# Patient Record
Sex: Male | Born: 1935 | Race: White | Hispanic: No | Marital: Married | Smoking: Never smoker
Health system: Southern US, Community
[De-identification: ages and names within clinical notes are randomized; demographics above are authoritative.]

## PROBLEM LIST (undated history)

## (undated) DIAGNOSIS — I10 Essential (primary) hypertension: Secondary | ICD-10-CM

## (undated) DIAGNOSIS — E119 Type 2 diabetes mellitus without complications: Secondary | ICD-10-CM

## (undated) DIAGNOSIS — C801 Malignant (primary) neoplasm, unspecified: Secondary | ICD-10-CM

## (undated) DIAGNOSIS — Z973 Presence of spectacles and contact lenses: Secondary | ICD-10-CM

## (undated) DIAGNOSIS — H269 Unspecified cataract: Secondary | ICD-10-CM

## (undated) DIAGNOSIS — M1990 Unspecified osteoarthritis, unspecified site: Secondary | ICD-10-CM

## (undated) DIAGNOSIS — N400 Enlarged prostate without lower urinary tract symptoms: Secondary | ICD-10-CM

## (undated) DIAGNOSIS — IMO0001 Reserved for inherently not codable concepts without codable children: Secondary | ICD-10-CM

## (undated) DIAGNOSIS — Z972 Presence of dental prosthetic device (complete) (partial): Secondary | ICD-10-CM

## (undated) DIAGNOSIS — R3915 Urgency of urination: Secondary | ICD-10-CM

## (undated) DIAGNOSIS — H9190 Unspecified hearing loss, unspecified ear: Secondary | ICD-10-CM

## (undated) HISTORY — PX: CATARACT EXTRACTION: SUR2

## (undated) HISTORY — PX: COLONOSCOPY: SHX174

---

## 1999-08-07 ENCOUNTER — Ambulatory Visit (HOSPITAL_COMMUNITY): Admission: RE | Admit: 1999-08-07 | Discharge: 1999-08-07 | Payer: Self-pay | Admitting: Gastroenterology

## 2004-08-07 ENCOUNTER — Ambulatory Visit (HOSPITAL_COMMUNITY): Admission: RE | Admit: 2004-08-07 | Discharge: 2004-08-07 | Payer: Self-pay | Admitting: Gastroenterology

## 2008-10-04 ENCOUNTER — Emergency Department: Payer: Self-pay | Admitting: Internal Medicine

## 2010-03-03 ENCOUNTER — Emergency Department: Payer: Self-pay | Admitting: Psychiatry

## 2013-06-12 ENCOUNTER — Other Ambulatory Visit: Payer: Self-pay | Admitting: Orthopedic Surgery

## 2013-06-18 ENCOUNTER — Encounter (HOSPITAL_BASED_OUTPATIENT_CLINIC_OR_DEPARTMENT_OTHER): Payer: Self-pay | Admitting: *Deleted

## 2013-06-18 NOTE — Progress Notes (Signed)
W[facility] to bring [facility] from [facility]-and all meds- To call back with med list

## 2013-06-22 NOTE — H&P (Signed)
  Caleb Dougherty is an 79 y.o. male.   Chief Complaint: c/o chronic and progressive numbness and tingling of the right hand HPI: Zarius Furr presented for evaluation of a history of marked swelling of his right hand and wrist and hand numbness, right worse then left. Mr. Reth is 79 years of age and right hand dominant. He was the owner of a Restaurant manager, fast food. In the first week of September 2014 he noted marked swelling of his right hand and wrist. He was seen at a walk in clinic in [place], [place]. He was thought to have cellulitis and given antibiotics. He followed up with his primary care physician and was referred to orthopaedics at the [facility]. The physicians at [facility] felt he probably had a resolving cellulitis or synovitis and recommended completion of the antibiotics. He now presents for evaluation of hand numbness and concern about his history of swelling. He does have a history of type II diabetes, hyperlipidemia, hypertension and osteoarthritis.    Past Medical History  Diagnosis Date  . Wears dentures   . Wears glasses     reading  . Hypertension   . Diabetes mellitus without complication   . Arthritis   . GERD (gastroesophageal reflux disease)   . BPH (benign prostatic hypertrophy)     Past Surgical History  Procedure Laterality Date  . Colonoscopy    . Cataract extraction      History reviewed. No pertinent family history. Social History:  reports that he has never smoked. He does not have any smokeless tobacco history on file. He reports that he does not drink alcohol or use illicit drugs.  Allergies: No Known Allergies  No prescriptions prior to admission    No results found for this or any previous visit (from the past 48 hour(s)).  No results found.   Pertinent items are noted in HPI.  Height 5\' 8"  (1.727 m), weight 78.019 kg (172 lb).  General appearance: alert Head: Normocephalic, without obvious abnormality Neck: supple, symmetrical,  trachea midline Resp: clear to auscultation bilaterally Cardio: regular rate and rhythm GI: normal findings: bowel sounds normal Extremities:  Inspection of his hands and wrists reveal no rubor or swelling at this time. He does have Heberden's and Bouchard's nodes and shouldering of his thumb CMC joints bilaterally. He has essentially full motion but notes diminished sensibility in the median distribution of the right hand. He has positive provocative signs of carpal tunnel syndrome on the left. His pulses and capillary refill are intact. X-rays of his right hand and wrist AP, lateral and oblique from an outside source demonstrate wide spread chondrocalcinosis and advanced osteoarthritis of his IP joints, thumb CMC joint and right thumb MP joint Dr. Johna Roles performed electrodiagnostic studies of the median and ulnar nerves. Mr. Duesing has significant right carpal tunnel syndrome and mild to moderate left carpal tunnel syndrome Pulses: 2+ and symmetric Skin: normal Neurologic: Grossly normal    Assessment/Plan Impression: Right CTS  Plan: TO the OR for right CTR.The procedure, risks,benefits and post-op course were discussed with the patient at length and they were in agreement with the plan.  DASNOIT,Jimma Ortman J 06/22/2013, 2:16 PM   H&P documentation: 06/23/2013  -History and Physical Reviewed  -Patient has been re-examined  -No change in the plan of care  Wyn Forster, MD

## 2013-06-23 ENCOUNTER — Encounter (HOSPITAL_BASED_OUTPATIENT_CLINIC_OR_DEPARTMENT_OTHER)
Admission: RE | Disposition: A | Discharge status: Home / Self Care | Payer: Self-pay | Source: Ambulatory Visit | Attending: Orthopedic Surgery

## 2013-06-23 ENCOUNTER — Encounter (HOSPITAL_BASED_OUTPATIENT_CLINIC_OR_DEPARTMENT_OTHER): Payer: Self-pay | Admitting: *Deleted

## 2013-06-23 ENCOUNTER — Encounter (HOSPITAL_BASED_OUTPATIENT_CLINIC_OR_DEPARTMENT_OTHER): Payer: Medicare Other | Admitting: Anesthesiology

## 2013-06-23 ENCOUNTER — Ambulatory Visit (HOSPITAL_BASED_OUTPATIENT_CLINIC_OR_DEPARTMENT_OTHER): Payer: Medicare Other | Admitting: Anesthesiology

## 2013-06-23 ENCOUNTER — Ambulatory Visit (HOSPITAL_BASED_OUTPATIENT_CLINIC_OR_DEPARTMENT_OTHER)
Admission: RE | Admit: 2013-06-23 | Discharge: 2013-06-23 | Disposition: A | Discharge status: Home / Self Care | Payer: Medicare Other | Source: Ambulatory Visit | Attending: Orthopedic Surgery | Admitting: Orthopedic Surgery

## 2013-06-23 DIAGNOSIS — K219 Gastro-esophageal reflux disease without esophagitis: Secondary | ICD-10-CM | POA: Insufficient documentation

## 2013-06-23 DIAGNOSIS — N400 Enlarged prostate without lower urinary tract symptoms: Secondary | ICD-10-CM | POA: Insufficient documentation

## 2013-06-23 DIAGNOSIS — I10 Essential (primary) hypertension: Secondary | ICD-10-CM | POA: Insufficient documentation

## 2013-06-23 DIAGNOSIS — M129 Arthropathy, unspecified: Secondary | ICD-10-CM | POA: Insufficient documentation

## 2013-06-23 DIAGNOSIS — G5600 Carpal tunnel syndrome, unspecified upper limb: Secondary | ICD-10-CM | POA: Insufficient documentation

## 2013-06-23 DIAGNOSIS — E119 Type 2 diabetes mellitus without complications: Secondary | ICD-10-CM | POA: Insufficient documentation

## 2013-06-23 DIAGNOSIS — E785 Hyperlipidemia, unspecified: Secondary | ICD-10-CM | POA: Insufficient documentation

## 2013-06-23 DIAGNOSIS — M11239 Other chondrocalcinosis, unspecified wrist: Secondary | ICD-10-CM | POA: Insufficient documentation

## 2013-06-23 HISTORY — DX: Type 2 diabetes mellitus without complications: E11.9

## 2013-06-23 HISTORY — DX: Presence of spectacles and contact lenses: Z97.3

## 2013-06-23 HISTORY — PX: CARPAL TUNNEL RELEASE: SHX101

## 2013-06-23 HISTORY — DX: Unspecified osteoarthritis, unspecified site: M19.90

## 2013-06-23 HISTORY — DX: Essential (primary) hypertension: I10

## 2013-06-23 HISTORY — DX: Benign prostatic hyperplasia without lower urinary tract symptoms: N40.0

## 2013-06-23 HISTORY — DX: Presence of dental prosthetic device (complete) (partial): Z97.2

## 2013-06-23 LAB — POCT I-STAT, CHEM 8
BUN: 31 mg/dL — ABNORMAL HIGH (ref 6–23)
Calcium, Ion: 1.28 mmol/L (ref 1.13–1.30)
Chloride: 104 mEq/L (ref 96–112)
Creatinine, Ser: 1.1 mg/dL (ref 0.50–1.35)
Glucose, Bld: 183 mg/dL — ABNORMAL HIGH (ref 70–99)
Sodium: 142 mEq/L (ref 135–145)

## 2013-06-23 LAB — GLUCOSE, CAPILLARY: Glucose-Capillary: 152 mg/dL — ABNORMAL HIGH (ref 70–99)

## 2013-06-23 SURGERY — CARPAL TUNNEL RELEASE
Anesthesia: General | Site: Hand | Laterality: Right | Wound class: Clean

## 2013-06-23 MED ORDER — FENTANYL CITRATE 0.05 MG/ML IJ SOLN
INTRAMUSCULAR | Status: AC
  Filled 2013-06-23: qty 2

## 2013-06-23 MED ORDER — CHLORHEXIDINE GLUCONATE 4 % EX LIQD: 60.0000 mL | Freq: Once | CUTANEOUS | Status: DC

## 2013-06-23 MED ORDER — ONDANSETRON HCL 4 MG/2ML IJ SOLN
INTRAMUSCULAR | Status: DC | PRN
  Administered 2013-06-23: 4 mg via INTRAVENOUS

## 2013-06-23 MED ORDER — FENTANYL CITRATE 0.05 MG/ML IJ SOLN
INTRAMUSCULAR | Status: DC | PRN
  Administered 2013-06-23: 50 ug via INTRAVENOUS

## 2013-06-23 MED ORDER — LIDOCAINE HCL 2 % IJ SOLN
INTRAMUSCULAR | Status: AC
  Filled 2013-06-23: qty 20

## 2013-06-23 MED ORDER — OXYCODONE HCL 5 MG PO TABS: 5.0000 mg | ORAL_TABLET | Freq: Once | ORAL | Status: DC | PRN

## 2013-06-23 MED ORDER — PROPOFOL 10 MG/ML IV EMUL
INTRAVENOUS | Status: AC
  Filled 2013-06-23: qty 50

## 2013-06-23 MED ORDER — PROPOFOL 10 MG/ML IV BOLUS
INTRAVENOUS | Status: DC | PRN
  Administered 2013-06-23: 20 mg via INTRAVENOUS

## 2013-06-23 MED ORDER — MIDAZOLAM HCL 2 MG/2ML IJ SOLN
INTRAMUSCULAR | Status: AC
  Filled 2013-06-23: qty 2

## 2013-06-23 MED ORDER — METOCLOPRAMIDE HCL 5 MG/ML IJ SOLN: 10.0000 mg | Freq: Once | INTRAMUSCULAR | Status: DC | PRN

## 2013-06-23 MED ORDER — PROPOFOL INFUSION 10 MG/ML OPTIME
INTRAVENOUS | Status: DC | PRN
  Administered 2013-06-23: 75 ug/kg/min via INTRAVENOUS

## 2013-06-23 MED ORDER — OXYCODONE HCL 5 MG/5ML PO SOLN: 5.0000 mg | Freq: Once | ORAL | Status: DC | PRN

## 2013-06-23 MED ORDER — ACETAMINOPHEN-CODEINE #3 300-30 MG PO TABS: 1.0000 | ORAL_TABLET | ORAL | Status: DC | PRN

## 2013-06-23 MED ORDER — LIDOCAINE HCL 2 % IJ SOLN
INTRAMUSCULAR | Status: DC | PRN
  Administered 2013-06-23: 6 mL

## 2013-06-23 MED ORDER — LACTATED RINGERS IV SOLN
INTRAVENOUS | Status: DC
  Administered 2013-06-23: 08:00:00 via INTRAVENOUS

## 2013-06-23 MED ORDER — HYDROMORPHONE HCL PF 1 MG/ML IJ SOLN: 0.2500 mg | INTRAMUSCULAR | Status: DC | PRN

## 2013-06-23 SURGICAL SUPPLY — 39 items
BANDAGE ADHESIVE 1X3 (GAUZE/BANDAGES/DRESSINGS) IMPLANT
BANDAGE ELASTIC 3 VELCRO ST LF (GAUZE/BANDAGES/DRESSINGS) IMPLANT
BLADE SURG 15 STRL LF DISP TIS (BLADE) ×1 IMPLANT
BLADE SURG 15 STRL SS (BLADE) ×2
BNDG CMPR 9X4 STRL LF SNTH (GAUZE/BANDAGES/DRESSINGS) ×1
BNDG COHESIVE 3X5 TAN STRL LF (GAUZE/BANDAGES/DRESSINGS) ×2 IMPLANT
BNDG ESMARK 4X9 LF (GAUZE/BANDAGES/DRESSINGS) ×1 IMPLANT
BRUSH SCRUB EZ PLAIN DRY (MISCELLANEOUS) ×2 IMPLANT
CORDS BIPOLAR (ELECTRODE) ×1 IMPLANT
COVER MAYO STAND STRL (DRAPES) ×2 IMPLANT
COVER TABLE BACK 60X90 (DRAPES) ×2 IMPLANT
CUFF TOURNIQUET SINGLE 18IN (TOURNIQUET CUFF) ×2 IMPLANT
DECANTER SPIKE VIAL GLASS SM (MISCELLANEOUS) IMPLANT
DRAPE EXTREMITY T 121X128X90 (DRAPE) ×2 IMPLANT
DRAPE SURG 17X23 STRL (DRAPES) ×2 IMPLANT
GLOVE BIOGEL M 7.0 STRL (GLOVE) ×1 IMPLANT
GLOVE BIOGEL M STRL SZ7.5 (GLOVE) IMPLANT
GLOVE ECLIPSE 6.5 STRL STRAW (GLOVE) ×2 IMPLANT
GLOVE EXAM NITRILE MD LF STRL (GLOVE) ×2 IMPLANT
GLOVE ORTHO TXT STRL SZ7.5 (GLOVE) ×2 IMPLANT
GOWN BRE IMP PREV XXLGXLNG (GOWN DISPOSABLE) ×2 IMPLANT
GOWN PREVENTION PLUS XLARGE (GOWN DISPOSABLE) ×2 IMPLANT
NEEDLE 27GAX1X1/2 (NEEDLE) ×2 IMPLANT
PACK BASIN DAY SURGERY FS (CUSTOM PROCEDURE TRAY) ×2 IMPLANT
PAD CAST 3X4 CTTN HI CHSV (CAST SUPPLIES) ×1 IMPLANT
PADDING CAST ABS 4INX4YD NS (CAST SUPPLIES)
PADDING CAST ABS COTTON 4X4 ST (CAST SUPPLIES) ×1 IMPLANT
PADDING CAST COTTON 3X4 STRL (CAST SUPPLIES) ×2
SPLINT PLASTER CAST XFAST 3X15 (CAST SUPPLIES) ×5 IMPLANT
SPLINT PLASTER XTRA FASTSET 3X (CAST SUPPLIES) ×5
SPONGE GAUZE 4X4 12PLY (GAUZE/BANDAGES/DRESSINGS) ×2 IMPLANT
STOCKINETTE 4X48 STRL (DRAPES) ×2 IMPLANT
STRIP CLOSURE SKIN 1/2X4 (GAUZE/BANDAGES/DRESSINGS) ×2 IMPLANT
SUT PROLENE 3 0 PS 2 (SUTURE) ×2 IMPLANT
SYR 3ML 23GX1 SAFETY (SYRINGE) IMPLANT
SYR CONTROL 10ML LL (SYRINGE) ×1 IMPLANT
TOWEL OR 17X24 6PK STRL BLUE (TOWEL DISPOSABLE) ×2 IMPLANT
TRAY DSU PREP LF (CUSTOM PROCEDURE TRAY) ×2 IMPLANT
UNDERPAD 30X30 INCONTINENT (UNDERPADS AND DIAPERS) ×2 IMPLANT

## 2013-06-23 NOTE — Brief Op Note (Signed)
06/23/2013  9:13 AM  PATIENT:  Caleb Dougherty  79 y.o. 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  POST-OPERATIVE DIAGNOSIS:  RIGHT PSEUDO GOUT AND CARPAL TUNNEL SYNDROME  PROCEDURE:  Procedure(s): RIGHT CARPAL TUNNEL RELEASE (Right)  SURGEON:  Surgeon(s) and Role:    * Wyn Forster., MD - Primary  PHYSICIAN ASSISTANT:   ASSISTANTS: registered nurse  ANESTHESIA:   IV sedation  EBL:  Total I/O In: 400 [I.V.:400] Out: -   BLOOD ADMINISTERED:none  DRAINS: none   LOCAL MEDICATIONS USED:  XYLOCAINE   SPECIMEN:  No Specimen  DISPOSITION OF SPECIMEN:  N/A  COUNTS:  YES  TOURNIQUET:   Total Tourniquet Time Documented: Upper Arm (Right) - 5 minutes Total: Upper Arm (Right) - 5 minutes   DICTATION: .Other Dictation: Dictation Number [id number]  PLAN OF CARE: Discharge to home after PACU  PATIENT DISPOSITION:  PACU - hemodynamically stable.   Delay start of Pharmacological VTE agent (>24hrs) due to surgical blood loss or risk of bleeding: not applicable

## 2013-06-23 NOTE — Anesthesia Preprocedure Evaluation (Signed)
Anesthesia Evaluation  Patient identified by MRN, date of birth, ID band Patient awake    Reviewed: Allergy & Precautions, H&P , NPO status , Patient's Chart, lab work & pertinent test results, reviewed documented beta blocker date and time   Airway Mallampati: II TM Distance: >3 FB Neck ROM: full    Dental   Pulmonary neg pulmonary ROS,  breath sounds clear to auscultation        Cardiovascular hypertension, On Medications Rhythm:regular     Neuro/Psych negative neurological ROS  negative psych ROS   GI/Hepatic Neg liver ROS, GERD-  Medicated and Controlled,  Endo/Other  diabetes, Oral Hypoglycemic Agents  Renal/GU negative Renal ROS  negative genitourinary   Musculoskeletal   Abdominal   Peds  Hematology negative hematology ROS (+)   Anesthesia Other Findings See surgeon's H&P   Reproductive/Obstetrics negative OB ROS                           Anesthesia Physical Anesthesia Plan  ASA: III  Anesthesia Plan: General   Post-op Pain Management:    Induction: Intravenous  Airway Management Planned: LMA  Additional Equipment:   Intra-op Plan:   Post-operative Plan:   Informed Consent: I have reviewed the patients History and Physical, chart, labs and discussed the procedure including the risks, benefits and alternatives for the proposed anesthesia with the patient or authorized representative who has indicated his/her understanding and acceptance.   Dental Advisory Given  Plan Discussed with: CRNA and Surgeon  Anesthesia Plan Comments:         Anesthesia Quick Evaluation

## 2013-06-23 NOTE — Anesthesia Postprocedure Evaluation (Signed)
Anesthesia Post Note  Patient: Caleb Dougherty  Procedure(s) Performed: Procedure(s) (LRB): RIGHT CARPAL TUNNEL RELEASE (Right)  Anesthesia type: MAC  Patient location: PACU  Post pain: Pain level controlled  Post assessment: Patient's Cardiovascular Status Stable  Last Vitals:  Filed Vitals:   06/23/13 0942  BP: 116/62  Pulse: 62  Temp:   Resp: 14    Post vital signs: Reviewed and stable  Level of consciousness: alert  Complications: No apparent anesthesia complications

## 2013-06-23 NOTE — Op Note (Signed)
NAME:  Caleb Dougherty, AMRHEIN NO.:  [id number]  MEDICAL RECORD NO.:  [id number]  LOCATION:                                 FACILITY:  PHYSICIAN:  Katy Fitch. La Dibella, M.D.      DATE OF BIRTH:  DATE OF PROCEDURE:  06/23/2013 DATE OF DISCHARGE:                              OPERATIVE REPORT   PREOPERATIVE DIAGNOSIS:  Severe right carpal tunnel syndrome associated with insulin-dependent diabetes, and pseudogout.  POSTOPERATIVE DIAGNOSIS:  Severe right carpal tunnel syndrome associated with insulin-dependent diabetes, and pseudogout with profound synovitis of ulnar bursa.  OPERATION:  Release of right transverse carpal ligament.  OPERATING SURGEON:  Katy Fitch. Adorian Gwynne, MD.  ASSISTANT:  Registered nurse.  ANESTHESIA:  IV sedation with 2% lidocaine field block of distal wrist median nerve and palm.  This was a monitored anesthesia care case.  SUPERVISED ANESTHESIOLOGIST:  Dr. Gelene Mink.  INDICATIONS:  Isom Kochan is an 79 year old gentleman, referred by Dr. Burnett Sheng of [facility] [place] for management of severe hand numbness and hand swelling.  Clinical examination revealed signs of osteoarthritis.  X-rays revealed extensive chondrocalcinosis. Electrodiagnostic studies confirmed bilateral carpal tunnel syndrome.  We helped him with his chondrocalcinosis with anti-inflammatory medication and relieved his swelling.  His carpal tunnel symptoms have not abated.  We recommended proceeding with release of his transverse carpal ligament, followed by therapy to regain motion.  After informed consent, he was brought to the operating room at this time.  DESCRIPTION OF PROCEDURE:  Lindberg Zenon is brought to room 2 of the [facility] and placed in supine position on the operating table.  In the holding area, his right hand and arm were identified as the proper surgical site per protocol, marked with a marking pen.  He had detailed anesthesia informed  consent by Dr. Justin Mend, who recommended IV sedation and local anesthesia.  This was accepted by Mr. Pursifull.  In room 2, under Dr. Thornton Dales supervision, IV sedation was provided, followed by routine Betadine scrub and paint of the right upper extremity.  A pneumatic tourniquet was applied to the proximal right brachium.  Following exsanguination of right arm with Esmarch bandage, the arterial tourniquet on the proximal brachium was inflated to 220 mmHg.  A total of 6 mL of 2% lidocaine was infiltrated around the median nerve and the distal forearm and along the path of the intended incision.  Following routine surgical time-out, procedure commenced with exsanguination of the right arm with an Esmarch bandage, inflation of arterial tourniquet to 220 mmHg.  Procedure commenced with a 3 cm incision in the line of the ring finger and the palm.  Subcutaneous tissues were carefully divided revealing the palmar fascia.  This was split longitudinally to reveal the common sensory branch of the median nerve.  These were followed back to the median nerve proper, which was invested in opaque and very fibrotic ulnar bursa tissues.  With great care, the distal margin of the transverse carpal ligament was isolated.  There were multiple muscle anomalies with variance of the palmaris brevis, extending across the midline.  These were teased apart looking for anomalous motor branches.  None were identified.  The transverse  carpal ligament was then isolated and a [place] 4 elevator was passed through the carpal canal, clearing a pathway superficial to the median nerve.  The nerve was then decompressed by release of the transverse carpal ligament along its ulnar border into the distal forearm.  We also released the volar forearm fascia, 5 cm above the distal wrist flexion crease.  The ulnar bursa was very fibrotic and opaque due to chronic pseudogout.  Bleeding points were electrocauterized with  bipolar current, followed by repair of the skin with intradermal 3-0 Prolene suture and Steri-Strips. A compressive dressing was applied with a volar plaster splint maintaining the wrist in 15 degrees of dorsiflexion.  For aftercare, Mr. Anger was provided a prescription for Tylenol with Codeine #3, 1 or 2 tablets p.o. q.4-6 hours p.r.n. pain, 20 tablets without refill.     Katy Fitch Anniya Whiters, M.D.     RVS/MEDQ  D:  06/23/2013  T:  06/23/2013  Job:  [id number]  cc:   Jerl Mina, MD

## 2013-06-23 NOTE — Transfer of Care (Signed)
Immediate Anesthesia Transfer of Care Note  Patient: Caleb Dougherty  Procedure(s) Performed: Procedure(s): RIGHT CARPAL TUNNEL RELEASE (Right)  Patient Location: PACU  Anesthesia Type:MAC  Level of Consciousness: awake and alert   Airway & Oxygen Therapy: Patient Spontanous Breathing and Patient connected to face mask oxygen  Post-op Assessment: Report given to PACU RN and Post -op Vital signs reviewed and stable  Post vital signs: Reviewed and stable  Complications: No apparent anesthesia complications

## 2013-06-24 ENCOUNTER — Encounter (HOSPITAL_BASED_OUTPATIENT_CLINIC_OR_DEPARTMENT_OTHER): Payer: Self-pay | Admitting: Orthopedic Surgery

## 2014-02-05 ENCOUNTER — Encounter (HOSPITAL_COMMUNITY): Payer: Self-pay | Admitting: Pharmacy Technician

## 2014-02-11 ENCOUNTER — Other Ambulatory Visit (HOSPITAL_COMMUNITY): Payer: Self-pay | Admitting: *Deleted

## 2014-02-12 ENCOUNTER — Encounter (HOSPITAL_COMMUNITY): Payer: Self-pay

## 2014-02-12 ENCOUNTER — Ambulatory Visit (HOSPITAL_COMMUNITY)
Admission: RE | Admit: 2014-02-12 | Discharge: 2014-02-12 | Disposition: A | Discharge status: Home / Self Care | Payer: Medicare Other | Source: Ambulatory Visit | Attending: Orthopedic Surgery | Admitting: Orthopedic Surgery

## 2014-02-12 ENCOUNTER — Encounter (HOSPITAL_COMMUNITY)
Admission: RE | Admit: 2014-02-12 | Discharge: 2014-02-12 | Disposition: A | Discharge status: Home / Self Care | Payer: Medicare Other | Source: Ambulatory Visit | Attending: Orthopedic Surgery | Admitting: Orthopedic Surgery

## 2014-02-12 DIAGNOSIS — I10 Essential (primary) hypertension: Secondary | ICD-10-CM | POA: Insufficient documentation

## 2014-02-12 DIAGNOSIS — R918 Other nonspecific abnormal finding of lung field: Secondary | ICD-10-CM | POA: Insufficient documentation

## 2014-02-12 DIAGNOSIS — Z01812 Encounter for preprocedural laboratory examination: Secondary | ICD-10-CM | POA: Insufficient documentation

## 2014-02-12 DIAGNOSIS — Z01818 Encounter for other preprocedural examination: Secondary | ICD-10-CM | POA: Insufficient documentation

## 2014-02-12 HISTORY — DX: Reserved for inherently not codable concepts without codable children: IMO0001

## 2014-02-12 HISTORY — DX: Unspecified hearing loss, unspecified ear: H91.90

## 2014-02-12 HISTORY — DX: Unspecified cataract: H26.9

## 2014-02-12 HISTORY — DX: Malignant (primary) neoplasm, unspecified: C80.1

## 2014-02-12 LAB — CBC
HEMATOCRIT: 40 % (ref 39.0–52.0)
HEMOGLOBIN: 13.3 g/dL (ref 13.0–17.0)
MCH: 30.2 pg (ref 26.0–34.0)
MCHC: 33.3 g/dL (ref 30.0–36.0)
MCV: 90.7 fL (ref 78.0–100.0)
Platelets: 152 10*3/uL (ref 150–400)
RBC: 4.41 MIL/uL (ref 4.22–5.81)
RDW: 12.4 % (ref 11.5–15.5)
WBC: 4.9 10*3/uL (ref 4.0–10.5)

## 2014-02-12 LAB — BASIC METABOLIC PANEL
Anion gap: 11 (ref 5–15)
BUN: 28 mg/dL — ABNORMAL HIGH (ref 6–23)
CHLORIDE: 100 meq/L (ref 96–112)
CO2: 26 meq/L (ref 19–32)
CREATININE: 1.04 mg/dL (ref 0.50–1.35)
Calcium: 9.7 mg/dL (ref 8.4–10.5)
GFR calc Af Amer: 75 mL/min — ABNORMAL LOW (ref >90)
GFR calc non Af Amer: 64 mL/min — ABNORMAL LOW (ref >90)
Glucose, Bld: 252 mg/dL — ABNORMAL HIGH (ref 70–99)
Potassium: 5.1 mEq/L (ref 3.7–5.3)
Sodium: 137 mEq/L (ref 137–147)

## 2014-02-12 LAB — URINALYSIS, ROUTINE W REFLEX MICROSCOPIC
Bilirubin Urine: NEGATIVE
Glucose, UA: 100 mg/dL — AB
Hgb urine dipstick: NEGATIVE
Ketones, ur: NEGATIVE mg/dL
LEUKOCYTES UA: NEGATIVE
Nitrite: NEGATIVE
PH: 5.5 (ref 5.0–8.0)
Protein, ur: NEGATIVE mg/dL
SPECIFIC GRAVITY, URINE: 1.011 (ref 1.005–1.030)
Urobilinogen, UA: 0.2 mg/dL (ref 0.0–1.0)

## 2014-02-12 LAB — PROTIME-INR
INR: 1.09 (ref 0.00–1.49)
Prothrombin Time: 14.1 seconds (ref 11.6–15.2)

## 2014-02-12 LAB — SURGICAL PCR SCREEN
MRSA, PCR: INVALID — AB
Staphylococcus aureus: INVALID — AB

## 2014-02-12 LAB — APTT: APTT: 31 s (ref 24–37)

## 2014-02-12 NOTE — Pre-Procedure Instructions (Signed)
02-12-14 EKG 02-03-14 report with chart. Clearance note -Dr. Hedrick-02-03-14 with chart.

## 2014-02-12 NOTE — Patient Instructions (Addendum)
[place]  02/12/2014   Your procedure is scheduled on: 7-28  -2015 Tuesday at [place] through [facility] Entrance and follow signs to [place]. Arrive at   0700     AM .  Call this number if you have problems the morning of surgery: [phone]  Or Presurgical Testing [phone](Jolissa Kapral) For Living Will and/or Health Care Power Attorney Forms: please provide copy for your medical record,may bring AM of surgery(Forms should be already notarized -we do not provide this service).(02-12-14 Yes/ will bring Living Will copy on 02-23-14 AM to place with chart).      Do not eat food:After Midnight.    Take these medicines the morning of surgery with A SIP OF WATER: Tylenol#3(if needed). D not take any Lantus Insulin or Diabetic meds AM of surgery.   Do not wear jewelry, make-up or nail polish.  Do not wear lotions, powders, or perfumes. You may wear deodorant.  Do not shave 48 hours(2 days) prior to first CHG shower(legs and under arms).(Shaving face and neck okay.)  Do not bring valuables to the hospital.(Hospital is not responsible for lost valuables).  Contacts, dentures or removable bridgework, body piercing, hair pins may not be worn into surgery.  Leave suitcase in the car. After surgery it may be brought to your room.  For patients admitted to the hospital, checkout time is 11:00 AM the day of discharge.(Restricted visitors-Any Persons displaying flu-like symptoms or illness).    Patients discharged the day of surgery will not be allowed to drive home. Must have responsible person with you x 24 hours once discharged.  Name and phone number of your driver: Deston Bilyeu [phone] cell/ h [phone]  Special Instructions: CHG(Chlorhedine 4%-"Hibiclens","Betasept","Aplicare") Shower Use Special Wash: see special instructions.(avoid face and genitals)   Please read over the following fact sheets that you were given: MRSA Information, Blood  Transfusion fact sheet, Incentive Spirometry Instruction.  Remember : Type/Screen "Blue armbands" - may not be removed once applied(would result in being retested AM of surgery, if removed).    ________________________    [facility] - Preparing for Surgery Before surgery, you can play an important role.  Because skin is not sterile, your skin needs to be as free of germs as possible.  You can reduce the number of germs on your skin by washing with CHG (chlorahexidine gluconate) soap before surgery.  CHG is an antiseptic cleaner which kills germs and bonds with the skin to continue killing germs even after washing. Please DO NOT use if you have an allergy to CHG or antibacterial soaps.  If your skin becomes reddened/irritated stop using the CHG and inform your nurse when you arrive at Short Stay. Do not shave (including legs and underarms) for at least 48 hours prior to the first CHG shower.  You may shave your face/neck. Please follow these instructions carefully:  1.  Shower with CHG Soap the night before surgery and the  morning of Surgery.  2.  If you choose to wash your hair, wash your hair first as usual with your  normal  shampoo.  3.  After you shampoo, rinse your hair and body thoroughly to remove the  shampoo.                           4.  Use CHG as you would any other liquid soap.  You can apply chg directly  to the  skin and wash                       Gently with a scrungie or clean washcloth.  5.  Apply the CHG Soap to your body ONLY FROM THE NECK DOWN.   Do not use on face/ open                           Wound or open sores. Avoid contact with eyes, ears mouth and genitals (private parts).                       Wash face,  Genitals (private parts) with your normal soap.             6.  Wash thoroughly, paying special attention to the area where your surgery  will be performed.  7.  Thoroughly rinse your body with warm water from the neck down.  8.  DO NOT shower/wash with your  normal soap after using and rinsing off  the CHG Soap.                9.  Pat yourself dry with a clean towel.            10.  Wear clean pajamas.            11.  Place clean sheets on your bed the night of your first shower and do not  sleep with pets. Day of Surgery : Do not apply any lotions/deodorants the morning of surgery.  Please wear clean clothes to the hospital/surgery center.  FAILURE TO FOLLOW THESE INSTRUCTIONS MAY RESULT IN THE CANCELLATION OF YOUR SURGERY PATIENT SIGNATURE_________________________________  NURSE SIGNATURE__________________________________  ________________________________________________________________________   Adam Phenix  An incentive spirometer is a tool that can help keep your lungs clear and active. This tool measures how well you are filling your lungs with each breath. Taking long deep breaths may help reverse or decrease the chance of developing breathing (pulmonary) problems (especially infection) following:  A long period of time when you are unable to move or be active. BEFORE THE PROCEDURE   If the spirometer includes an indicator to show your best effort, your nurse or respiratory therapist will set it to a desired goal.  If possible, sit up straight or lean slightly forward. Try not to slouch.  Hold the incentive spirometer in an upright position. INSTRUCTIONS FOR USE  1. Sit on the edge of your bed if possible, or sit up as far as you can in bed or on a chair. 2. Hold the incentive spirometer in an upright position. 3. Breathe out normally. 4. Place the mouthpiece in your mouth and seal your lips tightly around it. 5. Breathe in slowly and as deeply as possible, raising the piston or the ball toward the top of the column. 6. Hold your breath for 3-5 seconds or for as long as possible. Allow the piston or ball to fall to the bottom of the column. 7. Remove the mouthpiece from your mouth and breathe out normally. 8. Rest for a  few seconds and repeat Steps 1 through 7 at least 10 times every 1-2 hours when you are awake. Take your time and take a few normal breaths between deep breaths. 9. The spirometer may include an indicator to show your best effort. Use the indicator as a goal to work toward during each repetition. 10. After each set  of 10 deep breaths, practice coughing to be sure your lungs are clear. If you have an incision (the cut made at the time of surgery), support your incision when coughing by placing a pillow or rolled up towels firmly against it. Once you are able to get out of bed, walk around indoors and cough well. You may stop using the incentive spirometer when instructed by your caregiver.  RISKS AND COMPLICATIONS  Take your time so you do not get dizzy or light-headed.  If you are in pain, you may need to take or ask for pain medication before doing incentive spirometry. It is harder to take a deep breath if you are having pain. AFTER USE  Rest and breathe slowly and easily.  It can be helpful to keep track of a log of your progress. Your caregiver can provide you with a simple table to help with this. If you are using the spirometer at home, follow these instructions: [place] IF:   You are having difficultly using the spirometer.  You have trouble using the spirometer as often as instructed.  Your pain medication is not giving enough relief while using the spirometer.  You develop fever of 100.5 F (38.1 C) or higher. SEEK IMMEDIATE MEDICAL CARE IF:   You cough up bloody sputum that had not been present before.  You develop fever of 102 F (38.9 C) or greater.  You develop worsening pain at or near the incision site. MAKE SURE YOU:   Understand these instructions.  Will watch your condition.  Will get help right away if you are not doing well or get worse. Document Released: 11/26/2006 Document Revised: 10/08/2011 Document Reviewed: 01/27/2007 ExitCare Patient  Information 2014 ExitCare, [place].   ________________________________________________________________________  WHAT IS A BLOOD TRANSFUSION? Blood Transfusion Information  A transfusion is the replacement of blood or some of its parts. Blood is made up of multiple cells which provide different functions.  Red blood cells carry oxygen and are used for blood loss replacement.  White blood cells fight against infection.  Platelets control bleeding.  Plasma helps clot blood.  Other blood products are available for specialized needs, such as hemophilia or other clotting disorders. BEFORE THE TRANSFUSION  Who gives blood for transfusions?   Healthy volunteers who are fully evaluated to make sure their blood is safe. This is blood bank blood. Transfusion therapy is the safest it has ever been in the practice of medicine. Before blood is taken from a donor, a complete history is taken to make sure that person has no history of diseases nor engages in risky social behavior (examples are intravenous drug use or sexual activity with multiple partners). The donor's travel history is screened to minimize risk of transmitting infections, such as malaria. The donated blood is tested for signs of infectious diseases, such as HIV and hepatitis. The blood is then tested to be sure it is compatible with you in order to minimize the chance of a transfusion reaction. If you or a relative donates blood, this is often done in anticipation of surgery and is not appropriate for emergency situations. It takes many days to process the donated blood. RISKS AND COMPLICATIONS Although transfusion therapy is very safe and saves many lives, the main dangers of transfusion include:   Getting an infectious disease.  Developing a transfusion reaction. This is an allergic reaction to something in the blood you were given. Every precaution is taken to prevent this. The decision to have a blood  transfusion has been considered  carefully by your caregiver before blood is given. Blood is not given unless the benefits outweigh the risks. AFTER THE TRANSFUSION  Right after receiving a blood transfusion, you will usually feel much better and more energetic. This is especially true if your red blood cells have gotten low (anemic). The transfusion raises the level of the red blood cells which carry oxygen, and this usually causes an energy increase.  The nurse administering the transfusion will monitor you carefully for complications. HOME CARE INSTRUCTIONS  No special instructions are needed after a transfusion. You may find your energy is better. Speak with your caregiver about any limitations on activity for underlying diseases you may have. SEEK MEDICAL CARE IF:   Your condition is not improving after your transfusion.  You develop redness or irritation at the intravenous (IV) site. SEEK IMMEDIATE MEDICAL CARE IF:  Any of the following symptoms occur over the next 12 hours:  Shaking chills.  You have a temperature by mouth above 102 F (38.9 C), not controlled by medicine.  Chest, back, or muscle pain.  People around you feel you are not acting correctly or are confused.  Shortness of breath or difficulty breathing.  Dizziness and fainting.  You get a rash or develop hives.  You have a decrease in urine output.  Your urine turns a dark color or changes to pink, red, or brown. Any of the following symptoms occur over the next 10 days:  You have a temperature by mouth above 102 F (38.9 C), not controlled by medicine.  Shortness of breath.  Weakness after normal activity.  The white part of the eye turns yellow (jaundice).  You have a decrease in the amount of urine or are urinating less often.  Your urine turns a dark color or changes to pink, red, or brown. Document Released: 07/13/2000 Document Revised: 10/08/2011 Document Reviewed: 03/01/2008 [facility] Patient Information 2014 [place],  [place].  _______________________________________________________________________

## 2014-02-12 NOTE — Pre-Procedure Instructions (Addendum)
02-12-14 1610 Labs viewable in [place]. Note CXR repsult done today, viewable in Epic-faxed to office [phone]. W. Floy Sabina

## 2014-02-14 LAB — MRSA CULTURE

## 2014-02-19 NOTE — H&P (Signed)
TOTAL KNEE ADMISSION H&P  Patient is being admitted for left total knee arthroplasty.  Subjective:  Chief Complaint:   Left knee OA /pain.  HPI: Caleb Dougherty, 80 y.o. male, has a history of pain and functional disability in the left knee due to arthritis and has failed non-surgical conservative treatments for greater than 12 weeks to includeNSAID's and/or analgesics, corticosteriod injections and activity modification.  Onset of symptoms was gradual, starting 3+ years ago with gradually worsening course since that time. The patient noted no past surgery on the left knee(s).  Patient currently rates pain in the left knee(s) at 10 out of 10 with activity. Patient has worsening of pain with activity and weight bearing, pain that interferes with activities of daily living, pain with passive range of motion, crepitus and joint swelling.  Patient has evidence of periarticular osteophytes and joint space narrowing by imaging studies.  There is no active infection.  Risks, benefits and expectations were discussed with the patient.  Risks including but not limited to the risk of anesthesia, blood clots, nerve damage, blood vessel damage, failure of the prosthesis, infection and up to and including death.  Patient understand the risks, benefits and expectations and wishes to proceed with surgery.   PCP: [name], MD  D/C Plans:      Home with HHPT  Post-op Meds:       No Rx given  Tranexamic Acid:      To be given - IV    Decadron:     Is to be given  FYI:     ASA post-op  Oxycodone post-op   Past Medical History  Diagnosis Date  . Wears dentures   . Wears glasses     reading  . Hypertension   . GERD (gastroesophageal reflux disease)   . BPH (benign prostatic hypertrophy)   . Cataract     02-12-14 left- ready for surgery, but only has had right done  . Impaired hearing     hard of hearing -no hearing aids  . Diabetes mellitus without complication     diabetes x4 yrs-oral med and  Lantus used  . Arthritis     osteoarthritis-knees and feet  . Cancer     melanoma(back)-no futher issues. Skin cancers face "frozen"    Past Surgical History  Procedure Laterality Date  . Colonoscopy    . Cataract extraction Right   . Carpal tunnel release Right 06/23/2013    Procedure: RIGHT CARPAL TUNNEL RELEASE;  Surgeon: Cammie Sickle., MD;  Location: [facility];  Service: Orthopedics;  Laterality: Right;    No prescriptions prior to admission   No Known Allergies   History  Substance Use Topics  . Smoking status: Never Smoker   . Smokeless tobacco: Not on file  . Alcohol Use: No    No family history on file.   Review of Systems  Constitutional: Negative.   HENT: Positive for hearing loss.   Eyes: Negative.   Respiratory: Negative.   Cardiovascular: Negative.   Gastrointestinal: Positive for heartburn.  Genitourinary: Negative.   Musculoskeletal: Positive for joint pain.  Skin: Negative.   Neurological: Negative.   Endo/Heme/Allergies: Negative.   Psychiatric/Behavioral: Negative.     Objective:  Physical Exam  Constitutional: He is oriented to person, place, and time. He appears well-developed and well-nourished.  HENT:  Head: Normocephalic and atraumatic.  Mouth/Throat: Oropharynx is clear and moist.  Eyes: Pupils are equal, round, and reactive to light.  Neck: Neck  supple. No JVD present. No tracheal deviation present. No thyromegaly present.  Cardiovascular: Normal rate, regular rhythm, normal heart sounds and intact distal pulses.   Respiratory: Effort normal and breath sounds normal. No stridor. No respiratory distress. He has no wheezes.  GI: Soft. There is no tenderness. There is no guarding.  Musculoskeletal:       Left knee: He exhibits decreased range of motion, swelling and bony tenderness. He exhibits no ecchymosis, no deformity, no laceration and no erythema. Tenderness found.  Lymphadenopathy:    He has no cervical  adenopathy.  Neurological: He is alert and oriented to person, place, and time.  Skin: Skin is warm and dry.  Psychiatric: He has a normal mood and affect.    Labs:  Estimated body mass index is 25.25 kg/(m^2) as calculated from the following:   Height as of 06/23/13: 5\' 8"  (1.727 m).   Weight as of 06/23/13: 75.297 kg (166 lb).   Imaging Review Plain radiographs demonstrate severe degenerative joint disease of the left knee(s). The overall alignment is neutral. The bone quality appears to be good for age and reported activity level.  Assessment/Plan:  End stage arthritis, left knee   The patient history, physical examination, clinical judgment of the provider and imaging studies are consistent with end stage degenerative joint disease of the left knee(s) and total knee arthroplasty is deemed medically necessary. The treatment options including medical management, injection therapy arthroscopy and arthroplasty were discussed at length. The risks and benefits of total knee arthroplasty were presented and reviewed. The risks due to aseptic loosening, infection, stiffness, patella tracking problems, thromboembolic complications and other imponderables were discussed. The patient acknowledged the explanation, agreed to proceed with the plan and consent was signed. Patient is being admitted for inpatient treatment for surgery, pain control, PT, OT, prophylactic antibiotics, VTE prophylaxis, progressive ambulation and ADL's and discharge planning. The patient is planning to be discharged home with home health services.     West Pugh Jaleeah Slight   PA-C  02/19/2014, 10:29 PM

## 2014-02-23 ENCOUNTER — Inpatient Hospital Stay (HOSPITAL_COMMUNITY)
Admission: RE | Admit: 2014-02-23 | Discharge: 2014-02-24 | DRG: 470 | Disposition: A | Discharge status: Home / Self Care | Payer: Medicare Other | Source: Ambulatory Visit | Attending: Orthopedic Surgery | Admitting: Orthopedic Surgery

## 2014-02-23 ENCOUNTER — Inpatient Hospital Stay (HOSPITAL_COMMUNITY): Payer: Medicare Other | Admitting: Certified Registered Nurse Anesthetist

## 2014-02-23 ENCOUNTER — Encounter (HOSPITAL_COMMUNITY): Payer: Medicare Other | Admitting: Certified Registered Nurse Anesthetist

## 2014-02-23 ENCOUNTER — Encounter (HOSPITAL_COMMUNITY)
Admission: RE | Disposition: A | Discharge status: Home / Self Care | Payer: Self-pay | Source: Ambulatory Visit | Attending: Orthopedic Surgery

## 2014-02-23 ENCOUNTER — Encounter (HOSPITAL_COMMUNITY): Payer: Self-pay | Admitting: *Deleted

## 2014-02-23 DIAGNOSIS — M1710 Unilateral primary osteoarthritis, unspecified knee: Principal | ICD-10-CM | POA: Diagnosis present

## 2014-02-23 DIAGNOSIS — D62 Acute posthemorrhagic anemia: Secondary | ICD-10-CM | POA: Diagnosis not present

## 2014-02-23 DIAGNOSIS — E119 Type 2 diabetes mellitus without complications: Secondary | ICD-10-CM | POA: Diagnosis present

## 2014-02-23 DIAGNOSIS — K219 Gastro-esophageal reflux disease without esophagitis: Secondary | ICD-10-CM | POA: Diagnosis present

## 2014-02-23 DIAGNOSIS — N400 Enlarged prostate without lower urinary tract symptoms: Secondary | ICD-10-CM | POA: Diagnosis present

## 2014-02-23 DIAGNOSIS — M6580 Other synovitis and tenosynovitis, unspecified site: Secondary | ICD-10-CM | POA: Diagnosis present

## 2014-02-23 DIAGNOSIS — E663 Overweight: Secondary | ICD-10-CM | POA: Diagnosis present

## 2014-02-23 DIAGNOSIS — Z96652 Presence of left artificial knee joint: Secondary | ICD-10-CM

## 2014-02-23 DIAGNOSIS — M79609 Pain in unspecified limb: Secondary | ICD-10-CM | POA: Diagnosis present

## 2014-02-23 DIAGNOSIS — H9190 Unspecified hearing loss, unspecified ear: Secondary | ICD-10-CM | POA: Diagnosis present

## 2014-02-23 DIAGNOSIS — I10 Essential (primary) hypertension: Secondary | ICD-10-CM | POA: Diagnosis present

## 2014-02-23 DIAGNOSIS — Z85820 Personal history of malignant melanoma of skin: Secondary | ICD-10-CM

## 2014-02-23 DIAGNOSIS — Z96659 Presence of unspecified artificial knee joint: Secondary | ICD-10-CM

## 2014-02-23 HISTORY — PX: TOTAL KNEE ARTHROPLASTY: SHX125

## 2014-02-23 LAB — GLUCOSE, CAPILLARY
Glucose-Capillary: 134 mg/dL — ABNORMAL HIGH (ref 70–99)
Glucose-Capillary: 130 mg/dL — ABNORMAL HIGH (ref 70–99)
Glucose-Capillary: 331 mg/dL — ABNORMAL HIGH (ref 70–99)
Glucose-Capillary: 384 mg/dL — ABNORMAL HIGH (ref 70–99)

## 2014-02-23 LAB — ABO/RH: ABO/RH(D): A NEG

## 2014-02-23 LAB — TYPE AND SCREEN
ABO/RH(D): A NEG
Antibody Screen: NEGATIVE

## 2014-02-23 SURGERY — ARTHROPLASTY, KNEE, TOTAL
Anesthesia: Spinal | Site: Knee | Laterality: Left

## 2014-02-23 MED ORDER — INSULIN GLARGINE 100 UNIT/ML ~~LOC~~ SOLN
25.0000 [IU] | Freq: Every morning | SUBCUTANEOUS | Status: DC
  Administered 2014-02-24: 25 [IU] via SUBCUTANEOUS
  Filled 2014-02-23: qty 0.25

## 2014-02-23 MED ORDER — HYDROMORPHONE HCL PF 1 MG/ML IJ SOLN: 0.2500 mg | INTRAMUSCULAR | Status: DC | PRN

## 2014-02-23 MED ORDER — PROPOFOL INFUSION 10 MG/ML OPTIME
INTRAVENOUS | Status: DC | PRN
  Administered 2014-02-23: 75 ug/kg/min via INTRAVENOUS

## 2014-02-23 MED ORDER — DEXAMETHASONE SODIUM PHOSPHATE 4 MG/ML IJ SOLN
INTRAMUSCULAR | Status: DC | PRN
  Administered 2014-02-23: 10 mg via INTRAVENOUS

## 2014-02-23 MED ORDER — CHLORHEXIDINE GLUCONATE 4 % EX LIQD: 60.0000 mL | Freq: Once | CUTANEOUS | Status: DC

## 2014-02-23 MED ORDER — BUPIVACAINE-EPINEPHRINE (PF) 0.25% -1:200000 IJ SOLN
INTRAMUSCULAR | Status: DC | PRN
  Administered 2014-02-23: 30 mL

## 2014-02-23 MED ORDER — SODIUM CHLORIDE 0.9 % IJ SOLN
INTRAMUSCULAR | Status: DC | PRN
  Administered 2014-02-23: 9 mL

## 2014-02-23 MED ORDER — FENTANYL CITRATE 0.05 MG/ML IJ SOLN
INTRAMUSCULAR | Status: DC | PRN
  Administered 2014-02-23: 50 ug via INTRAVENOUS
  Administered 2014-02-23 (×2): 25 ug via INTRAVENOUS

## 2014-02-23 MED ORDER — HYDROCHLOROTHIAZIDE 25 MG PO TABS
25.0000 mg | ORAL_TABLET | Freq: Every evening | ORAL | Status: DC
  Administered 2014-02-23: 25 mg via ORAL
  Filled 2014-02-23 (×2): qty 1

## 2014-02-23 MED ORDER — 0.9 % SODIUM CHLORIDE (POUR BTL) OPTIME
TOPICAL | Status: DC | PRN
  Administered 2014-02-23: 1000 mL

## 2014-02-23 MED ORDER — TERAZOSIN HCL 5 MG PO CAPS
5.0000 mg | ORAL_CAPSULE | Freq: Every day | ORAL | Status: DC
  Administered 2014-02-23: 5 mg via ORAL
  Filled 2014-02-23 (×2): qty 1

## 2014-02-23 MED ORDER — PHENOL 1.4 % MT LIQD: 1.0000 | OROMUCOSAL | Status: DC | PRN

## 2014-02-23 MED ORDER — POLYETHYLENE GLYCOL 3350 17 G PO PACK
17.0000 g | PACK | Freq: Two times a day (BID) | ORAL | Status: DC
  Administered 2014-02-23 – 2014-02-24 (×2): 17 g via ORAL

## 2014-02-23 MED ORDER — INSULIN ASPART 100 UNIT/ML ~~LOC~~ SOLN
0.0000 [IU] | Freq: Three times a day (TID) | SUBCUTANEOUS | Status: DC
  Administered 2014-02-23 – 2014-02-24 (×2): 11 [IU] via SUBCUTANEOUS

## 2014-02-23 MED ORDER — SODIUM CHLORIDE 0.9 % IR SOLN
Status: DC | PRN
  Administered 2014-02-23: 1000 mL

## 2014-02-23 MED ORDER — ASPIRIN EC 325 MG PO TBEC
325.0000 mg | DELAYED_RELEASE_TABLET | Freq: Two times a day (BID) | ORAL | Status: DC
  Administered 2014-02-24: 325 mg via ORAL
  Filled 2014-02-23 (×3): qty 1

## 2014-02-23 MED ORDER — CEFAZOLIN SODIUM-DEXTROSE 2-3 GM-% IV SOLR
2.0000 g | INTRAVENOUS | Status: AC
  Administered 2014-02-23: 2 g via INTRAVENOUS

## 2014-02-23 MED ORDER — MENTHOL 3 MG MT LOZG
1.0000 | LOZENGE | OROMUCOSAL | Status: DC | PRN
  Filled 2014-02-23: qty 9

## 2014-02-23 MED ORDER — TRANEXAMIC ACID 100 MG/ML IV SOLN
1000.0000 mg | Freq: Once | INTRAVENOUS | Status: AC
  Administered 2014-02-23: 1000 mg via INTRAVENOUS
  Filled 2014-02-23: qty 10

## 2014-02-23 MED ORDER — BUPIVACAINE IN DEXTROSE 0.75-8.25 % IT SOLN
INTRATHECAL | Status: DC | PRN
  Administered 2014-02-23: 2 mL via INTRATHECAL

## 2014-02-23 MED ORDER — DOCUSATE SODIUM 100 MG PO CAPS
100.0000 mg | ORAL_CAPSULE | Freq: Two times a day (BID) | ORAL | Status: DC
  Administered 2014-02-23 – 2014-02-24 (×2): 100 mg via ORAL

## 2014-02-23 MED ORDER — MAGNESIUM CITRATE PO SOLN: 1.0000 | Freq: Once | ORAL | Status: AC | PRN

## 2014-02-23 MED ORDER — KETOROLAC TROMETHAMINE 30 MG/ML IJ SOLN
INTRAMUSCULAR | Status: DC | PRN
  Administered 2014-02-23: 30 mg

## 2014-02-23 MED ORDER — KETOROLAC TROMETHAMINE 30 MG/ML IJ SOLN
INTRAMUSCULAR | Status: AC
  Filled 2014-02-23: qty 1

## 2014-02-23 MED ORDER — BUPIVACAINE-EPINEPHRINE (PF) 0.25% -1:200000 IJ SOLN
INTRAMUSCULAR | Status: AC
  Filled 2014-02-23: qty 30

## 2014-02-23 MED ORDER — METHOCARBAMOL 1000 MG/10ML IJ SOLN
500.0000 mg | Freq: Four times a day (QID) | INTRAVENOUS | Status: DC | PRN
  Filled 2014-02-23: qty 5

## 2014-02-23 MED ORDER — SIMVASTATIN 40 MG PO TABS
40.0000 mg | ORAL_TABLET | Freq: Every evening | ORAL | Status: DC
  Administered 2014-02-23: 40 mg via ORAL
  Filled 2014-02-23 (×2): qty 1

## 2014-02-23 MED ORDER — OXYCODONE HCL 5 MG PO TABS
5.0000 mg | ORAL_TABLET | ORAL | Status: DC
  Administered 2014-02-23: 10 mg via ORAL
  Administered 2014-02-23 – 2014-02-24 (×4): 15 mg via ORAL
  Filled 2014-02-23 (×2): qty 3
  Filled 2014-02-23: qty 2
  Filled 2014-02-23 (×2): qty 3

## 2014-02-23 MED ORDER — BISACODYL 10 MG RE SUPP: 10.0000 mg | Freq: Every day | RECTAL | Status: DC | PRN

## 2014-02-23 MED ORDER — LACTATED RINGERS IV SOLN
INTRAVENOUS | Status: DC | PRN
  Administered 2014-02-23 (×3): via INTRAVENOUS

## 2014-02-23 MED ORDER — INSULIN ASPART 100 UNIT/ML ~~LOC~~ SOLN: 0.0000 [IU] | Freq: Three times a day (TID) | SUBCUTANEOUS | Status: DC

## 2014-02-23 MED ORDER — PROMETHAZINE HCL 25 MG/ML IJ SOLN: 6.2500 mg | INTRAMUSCULAR | Status: DC | PRN

## 2014-02-23 MED ORDER — METOCLOPRAMIDE HCL 5 MG/ML IJ SOLN: 5.0000 mg | Freq: Three times a day (TID) | INTRAMUSCULAR | Status: DC | PRN

## 2014-02-23 MED ORDER — CEFAZOLIN SODIUM-DEXTROSE 2-3 GM-% IV SOLR
INTRAVENOUS | Status: AC
  Filled 2014-02-23: qty 50

## 2014-02-23 MED ORDER — CEFAZOLIN SODIUM-DEXTROSE 2-3 GM-% IV SOLR
2.0000 g | Freq: Four times a day (QID) | INTRAVENOUS | Status: AC
  Administered 2014-02-23 (×2): 2 g via INTRAVENOUS
  Filled 2014-02-23 (×2): qty 50

## 2014-02-23 MED ORDER — METHOCARBAMOL 500 MG PO TABS
500.0000 mg | ORAL_TABLET | Freq: Four times a day (QID) | ORAL | Status: DC | PRN
  Administered 2014-02-23 – 2014-02-24 (×2): 500 mg via ORAL
  Filled 2014-02-23 (×2): qty 1

## 2014-02-23 MED ORDER — BUPIVACAINE LIPOSOME 1.3 % IJ SUSP
20.0000 mL | Freq: Once | INTRAMUSCULAR | Status: AC
  Administered 2014-02-23: 20 mL
  Filled 2014-02-23: qty 20

## 2014-02-23 MED ORDER — GLYBURIDE 5 MG PO TABS
10.0000 mg | ORAL_TABLET | Freq: Two times a day (BID) | ORAL | Status: DC
  Administered 2014-02-23 – 2014-02-24 (×2): 10 mg via ORAL
  Filled 2014-02-23 (×4): qty 2

## 2014-02-23 MED ORDER — METOCLOPRAMIDE HCL 10 MG PO TABS
5.0000 mg | ORAL_TABLET | Freq: Three times a day (TID) | ORAL | Status: DC | PRN
Start: 2014-02-23 — End: 2014-02-24

## 2014-02-23 MED ORDER — LACTATED RINGERS IV SOLN
INTRAVENOUS | Status: DC
  Administered 2014-02-23: 1000 mL via INTRAVENOUS

## 2014-02-23 MED ORDER — DEXAMETHASONE SODIUM PHOSPHATE 10 MG/ML IJ SOLN
10.0000 mg | Freq: Once | INTRAMUSCULAR | Status: DC
  Filled 2014-02-23: qty 1

## 2014-02-23 MED ORDER — SODIUM CHLORIDE 0.9 % IJ SOLN
INTRAMUSCULAR | Status: AC
  Filled 2014-02-23: qty 10

## 2014-02-23 MED ORDER — CELECOXIB 200 MG PO CAPS
200.0000 mg | ORAL_CAPSULE | Freq: Two times a day (BID) | ORAL | Status: DC
  Administered 2014-02-23 – 2014-02-24 (×2): 200 mg via ORAL
  Filled 2014-02-23 (×3): qty 1

## 2014-02-23 MED ORDER — HYDROMORPHONE HCL PF 1 MG/ML IJ SOLN
0.5000 mg | INTRAMUSCULAR | Status: DC | PRN
  Administered 2014-02-23: 0.5 mg via INTRAVENOUS
  Filled 2014-02-23: qty 1

## 2014-02-23 MED ORDER — ONDANSETRON HCL 4 MG/2ML IJ SOLN
INTRAMUSCULAR | Status: DC | PRN
  Administered 2014-02-23 (×2): 2 mg via INTRAVENOUS

## 2014-02-23 MED ORDER — DEXAMETHASONE SODIUM PHOSPHATE 10 MG/ML IJ SOLN: 10.0000 mg | Freq: Once | INTRAMUSCULAR | Status: DC

## 2014-02-23 MED ORDER — ONDANSETRON HCL 4 MG/2ML IJ SOLN: 4.0000 mg | Freq: Four times a day (QID) | INTRAMUSCULAR | Status: DC | PRN

## 2014-02-23 MED ORDER — FERROUS SULFATE 325 (65 FE) MG PO TABS
325.0000 mg | ORAL_TABLET | Freq: Three times a day (TID) | ORAL | Status: DC
  Administered 2014-02-23 – 2014-02-24 (×2): 325 mg via ORAL
  Filled 2014-02-23 (×5): qty 1

## 2014-02-23 MED ORDER — DIPHENHYDRAMINE HCL 25 MG PO CAPS: 25.0000 mg | ORAL_CAPSULE | Freq: Four times a day (QID) | ORAL | Status: DC | PRN

## 2014-02-23 MED ORDER — POTASSIUM CHLORIDE 2 MEQ/ML IV SOLN
INTRAVENOUS | Status: DC
  Administered 2014-02-23 (×2): via INTRAVENOUS
  Filled 2014-02-23 (×4): qty 1000

## 2014-02-23 MED ORDER — INSULIN ASPART 100 UNIT/ML ~~LOC~~ SOLN
0.0000 [IU] | Freq: Every day | SUBCUTANEOUS | Status: DC
  Administered 2014-02-23: 5 [IU] via SUBCUTANEOUS

## 2014-02-23 MED ORDER — ALUM & MAG HYDROXIDE-SIMETH 200-200-20 MG/5ML PO SUSP: 30.0000 mL | ORAL | Status: DC | PRN

## 2014-02-23 MED ORDER — ONDANSETRON HCL 4 MG PO TABS: 4.0000 mg | ORAL_TABLET | Freq: Four times a day (QID) | ORAL | Status: DC | PRN

## 2014-02-23 SURGICAL SUPPLY — 57 items
ADH SKN CLS APL DERMABOND .7 (GAUZE/BANDAGES/DRESSINGS) ×1
BAG SPEC THK2 15X12 ZIP CLS (MISCELLANEOUS)
BAG ZIPLOCK 12X15 (MISCELLANEOUS) IMPLANT
BANDAGE ELASTIC 6 VELCRO ST LF (GAUZE/BANDAGES/DRESSINGS) ×3 IMPLANT
BANDAGE ESMARK 6X9 LF (GAUZE/BANDAGES/DRESSINGS) ×1 IMPLANT
BLADE SAW SGTL 13.0X1.19X90.0M (BLADE) ×3 IMPLANT
BNDG CMPR 9X6 STRL LF SNTH (GAUZE/BANDAGES/DRESSINGS) ×1
BNDG ESMARK 6X9 LF (GAUZE/BANDAGES/DRESSINGS) ×3
BONE CEMENT GENTAMICIN (Cement) ×6 IMPLANT
BOWL SMART MIX CTS (DISPOSABLE) ×3 IMPLANT
CAPT RP KNEE ×2 IMPLANT
CEMENT BONE GENTAMICIN 40 (Cement) IMPLANT
CUFF TOURN SGL QUICK 34 (TOURNIQUET CUFF) ×3
CUFF TRNQT CYL 34X4X40X1 (TOURNIQUET CUFF) ×1 IMPLANT
DERMABOND ADVANCED (GAUZE/BANDAGES/DRESSINGS) ×2
DERMABOND ADVANCED .7 DNX12 (GAUZE/BANDAGES/DRESSINGS) ×1 IMPLANT
DRAPE EXTREMITY T 121X128X90 (DRAPE) ×3 IMPLANT
DRAPE POUCH INSTRU U-SHP 10X18 (DRAPES) ×3 IMPLANT
DRAPE U-SHAPE 47X51 STRL (DRAPES) ×3 IMPLANT
DRSG AQUACEL AG ADV 3.5X10 (GAUZE/BANDAGES/DRESSINGS) ×3 IMPLANT
DRSG TEGADERM 4X4.75 (GAUZE/BANDAGES/DRESSINGS) IMPLANT
DURAPREP 26ML APPLICATOR (WOUND CARE) ×6 IMPLANT
ELECT REM PT RETURN 9FT ADLT (ELECTROSURGICAL) ×3
ELECTRODE REM PT RTRN 9FT ADLT (ELECTROSURGICAL) ×1 IMPLANT
FACESHIELD WRAPAROUND (MASK) ×15 IMPLANT
FACESHIELD WRAPAROUND OR TEAM (MASK) ×5 IMPLANT
GAUZE SPONGE 2X2 8PLY STRL LF (GAUZE/BANDAGES/DRESSINGS) IMPLANT
GLOVE BIOGEL PI IND STRL 7.5 (GLOVE) ×1 IMPLANT
GLOVE BIOGEL PI IND STRL 8 (GLOVE) ×1 IMPLANT
GLOVE BIOGEL PI INDICATOR 7.5 (GLOVE) ×2
GLOVE BIOGEL PI INDICATOR 8 (GLOVE) ×2
GLOVE ECLIPSE 8.0 STRL XLNG CF (GLOVE) ×3 IMPLANT
GLOVE ORTHO TXT STRL SZ7.5 (GLOVE) ×6 IMPLANT
GOWN SPEC L3 XXLG W/TWL (GOWN DISPOSABLE) ×3 IMPLANT
GOWN STRL REUS W/TWL LRG LVL3 (GOWN DISPOSABLE) ×3 IMPLANT
HANDPIECE INTERPULSE COAX TIP (DISPOSABLE) ×3
KIT BASIN OR (CUSTOM PROCEDURE TRAY) ×3 IMPLANT
MANIFOLD NEPTUNE II (INSTRUMENTS) ×3 IMPLANT
NDL SAFETY ECLIPSE 18X1.5 (NEEDLE) ×1 IMPLANT
NEEDLE HYPO 18GX1.5 SHARP (NEEDLE) ×3
PACK TOTAL JOINT (CUSTOM PROCEDURE TRAY) ×3 IMPLANT
POSITIONER SURGICAL ARM (MISCELLANEOUS) ×3 IMPLANT
SET HNDPC FAN SPRY TIP SCT (DISPOSABLE) ×1 IMPLANT
SET PAD KNEE POSITIONER (MISCELLANEOUS) ×3 IMPLANT
SPONGE GAUZE 2X2 STER 10/PKG (GAUZE/BANDAGES/DRESSINGS)
SUCTION FRAZIER 12FR DISP (SUCTIONS) ×3 IMPLANT
SUT MNCRL AB 4-0 PS2 18 (SUTURE) ×3 IMPLANT
SUT VIC AB 1 CT1 36 (SUTURE) ×3 IMPLANT
SUT VIC AB 2-0 CT1 27 (SUTURE) ×9
SUT VIC AB 2-0 CT1 TAPERPNT 27 (SUTURE) ×3 IMPLANT
SUT VLOC 180 0 24IN GS25 (SUTURE) ×3 IMPLANT
SYRINGE 60CC LL (MISCELLANEOUS) ×3 IMPLANT
TOWEL OR 17X26 10 PK STRL BLUE (TOWEL DISPOSABLE) ×3 IMPLANT
TOWEL OR NON WOVEN STRL DISP B (DISPOSABLE) IMPLANT
TRAY FOLEY CATH 14FRSI W/METER (CATHETERS) ×3 IMPLANT
WATER STERILE IRR 1500ML POUR (IV SOLUTION) ×3 IMPLANT
WRAP KNEE MAXI GEL POST OP (GAUZE/BANDAGES/DRESSINGS) ×3 IMPLANT

## 2014-02-23 NOTE — Anesthesia Procedure Notes (Signed)
Spinal  Patient location during procedure: OR Start time: 02/23/2014 9:59 AM Staffing CRNA/Resident: Dion Saucier E Performed by: resident/CRNA  Preanesthetic Checklist Completed: patient identified, site marked, surgical consent, pre-op evaluation, timeout performed, IV checked, risks and benefits discussed and monitors and equipment checked Spinal Block Patient position: sitting Prep: Betadine and site prepped and draped Patient monitoring: heart rate, continuous pulse ox and blood pressure Approach: right paramedian Location: L2-3 Injection technique: single-shot Needle Needle type: Quincke  Needle gauge: 22 G Needle length: 9 cm Additional Notes Kit expiration date checked and confirmed. First attempt midline, then paramedian.  Negative heme, paresthesias, clear csf, tolerated well.

## 2014-02-23 NOTE — Anesthesia Postprocedure Evaluation (Signed)
  Anesthesia Post-op Note  Patient: Caleb Dougherty  Procedure(s) Performed: Procedure(s) (LRB): LEFT TOTAL KNEE REPLACEMENT (Left)  Patient Location: PACU  Anesthesia Type: Spinal  Level of Consciousness: awake and alert   Airway and Oxygen Therapy: Patient Spontanous Breathing  Post-op Pain: mild  Post-op Assessment: Post-op Vital signs reviewed, Patient's Cardiovascular Status Stable, Respiratory Function Stable, Patent Airway and No signs of Nausea or vomiting  Last Vitals:  Filed Vitals:   02/23/14 1500  BP: 131/70  Pulse: 55  Temp: 36.8 C  Resp: 16    Post-op Vital Signs: stable   Complications: No apparent anesthesia complications

## 2014-02-23 NOTE — Transfer of Care (Signed)
Immediate Anesthesia Transfer of Care Note  Patient: Caleb Dougherty  Procedure(s) Performed: Procedure(s): LEFT TOTAL KNEE REPLACEMENT (Left)  Patient Location: PACU  Anesthesia Type:Spinal  Level of Consciousness: awake, alert , oriented and patient cooperative  Airway & Oxygen Therapy: Patient Spontanous Breathing and Patient connected to face mask oxygen  Post-op Assessment: Report given to PACU RN and Post -op Vital signs reviewed and stable  Post vital signs: Reviewed and stable  Complications: No apparent anesthesia complications

## 2014-02-23 NOTE — Anesthesia Preprocedure Evaluation (Addendum)
Anesthesia Evaluation  Patient identified by MRN, date of birth, ID band Patient awake  General Assessment Comment: Wears dentures     .  Wears glasses         reading   .  Hypertension     .  GERD (gastroesophageal reflux disease)     .  BPH (benign prostatic hypertrophy)     .  Cataract         02-12-14 left- ready for surgery, but only has had right done   .  Impaired hearing         hard of hearing -no hearing aids   .  Diabetes mellitus without complication         diabetes x4 yrs-oral med and Lantus used   .  Arthritis         osteoarthritis-knees and feet   .  Cancer         melanoma(back)-no futher issues. Skin cancers face "frozen"     Reviewed: Allergy & Precautions, H&P , NPO status , Patient's Chart, lab work & pertinent test results  Airway Mallampati: II TM Distance: >3 FB Neck ROM: Full    Dental no notable dental hx.    Pulmonary neg pulmonary ROS,  breath sounds clear to auscultation  Pulmonary exam normal       Cardiovascular Exercise Tolerance: Good hypertension, Pt. on medications Rhythm:Regular Rate:Normal     Neuro/Psych negative neurological ROS  negative psych ROS   GI/Hepatic Neg liver ROS, GERD-  ,  Endo/Other  diabetes, Type 2, Oral Hypoglycemic Agents, Insulin Dependent  Renal/GU negative Renal ROS  negative genitourinary   Musculoskeletal negative musculoskeletal ROS (+)   Abdominal   Peds negative pediatric ROS (+)  Hematology negative hematology ROS (+)   Anesthesia Other Findings   Reproductive/Obstetrics negative OB ROS                         Anesthesia Physical Anesthesia Plan  ASA: III  Anesthesia Plan: Spinal   Post-op Pain Management:    Induction: Intravenous  Airway Management Planned:   Additional Equipment:   Intra-op Plan:   Post-operative Plan: Extubation in OR  Informed Consent: I have reviewed the patients History and  Physical, chart, labs and discussed the procedure including the risks, benefits and alternatives for the proposed anesthesia with the patient or authorized representative who has indicated his/her understanding and acceptance.   Dental advisory given  Plan Discussed with: CRNA  Anesthesia Plan Comments: (Discussed general and spinal. Discussed risks/benefits of spinal including headache, backache, failure, bleeding, infection, and nerve damage. Patient consents to spinal. Questions answered. Coagulation studies and platelet count acceptable.)      Anesthesia Quick Evaluation

## 2014-02-23 NOTE — Op Note (Signed)
NAME:  Caleb Dougherty                      MEDICAL RECORD NO.:  [id number]                             FACILITY:  [facility]      PHYSICIAN:  Pietro Cassis. Alvan Dame, M.D.  DATE OF BIRTH:  [birth year]      DATE OF PROCEDURE:  02/23/2014                                     OPERATIVE REPORT         PREOPERATIVE DIAGNOSIS:  Left knee osteoarthritis.      POSTOPERATIVE DIAGNOSIS:  Left knee osteoarthritis.      FINDINGS:  The patient was noted to have complete loss of cartilage and   bone-on-bone arthritis with associated osteophytes in all three compartments of   the knee with a significant synovitis and associated effusion.      PROCEDURE:  Left total knee replacement.      COMPONENTS USED:  DePuy rotating platform posterior stabilized knee   system, a size 4 femur, 4 tibia, 12.5 mm PS insert, and 41 patellar   button.      SURGEON:  Pietro Cassis. Alvan Dame, M.D.      ASSISTANT:  Harlen Labs, PA-C.      ANESTHESIA:  Spinal.      SPECIMENS:  None.      COMPLICATION:  None.      DRAINS:  None.  EBL: <100cc      TOURNIQUET TIME:   Total Tourniquet Time Documented: Thigh (Left) - 33 minutes Total: Thigh (Left) - 33 minutes  .      The patient was stable to the recovery room.      INDICATION FOR PROCEDURE:  Caleb Dougherty is a 80 y.o. male patient of   mine.  The patient had been seen, evaluated, and treated conservatively in the   office with medication, activity modification, and injections.  The patient had   radiographic changes of bone-on-bone arthritis with endplate sclerosis and osteophytes noted.      The patient failed conservative measures including medication, injections, and activity modification, and at this point was ready for more definitive measures.   Based on the radiographic changes and failed conservative measures, the patient   decided to proceed with total knee replacement.  Risks of infection,   DVT, component failure, need for revision surgery, postop course, and    expectations were all   discussed and reviewed.  Consent was obtained for benefit of pain   relief.      PROCEDURE IN DETAIL:  The patient was brought to the operative theater.   Once adequate anesthesia, preoperative antibiotics, 2 gm of Ancef administered, the patient was positioned supine with the left thigh tourniquet placed.  The  left lower extremity was prepped and draped in sterile fashion.  A time-   out was performed identifying the patient, planned procedure, and   extremity.      The left lower extremity was placed in the [facility] leg holder.  The leg was   exsanguinated, tourniquet elevated to 250 mmHg.  A midline incision was   made followed by median parapatellar arthrotomy.  Following initial   exposure, attention was first directed to  the patella.  Precut   measurement was noted to be 24 mm.  I resected down to 14 mm and used a   41 patellar button to restore patellar height as well as cover the cut   surface.      The lug holes were drilled and a metal shim was placed to protect the   patella from retractors and saw blades.      At this point, attention was now directed to the femur.  The femoral   canal was opened with a drill, irrigated to try to prevent fat emboli.  An   intramedullary rod was passed at 3 degrees valgus, 10 mm of bone was   resected off the distal femur.  Following this resection, the tibia was   subluxated anteriorly.  Using the extramedullary guide, 2 mm of bone was resected off   the proximal medial tibia.  We confirmed the gap would be   stable medially and laterally with a 10 mm insert as well as confirmed   the cut was perpendicular in the coronal plane, checking with an alignment rod.      Once this was done, I sized the femur to be a size 4 in the anterior-   posterior dimension, chose a standard component based on medial and   lateral dimension.  The size 4 rotation block was then pinned in   position anterior referenced using the  C-clamp to set rotation.  The   anterior, posterior, and  chamfer cuts were made without difficulty nor   notching making certain that I was along the anterior cortex to help   with flexion gap stability.      The final box cut was made off the lateral aspect of distal femur.      At this point, the tibia was sized to be a size 4, the size 4 tray was   then pinned in position through the medial third of the tubercle,   drilled, and keel punched.  Trial reduction was now carried with a 4 femur,  4 tibia, a 10 then 12.5 mm insert, and the 41 patella botton.  The knee was brought to   extension, full extension with good flexion stability with the patella   tracking through the trochlea without application of pressure.  Given   all these findings, the trial components removed.  Final components were   opened and cement was mixed.  The knee was irrigated with normal saline   solution and pulse lavage.  The synovial lining was   then injected with 20cc of Exparel, 30cc of 0.25% Marcaine with epinephrine and 1 cc of Toradol,   total of 61 cc.      The knee was irrigated.  Final implants were then cemented onto clean and   dried cut surfaces of bone with the knee brought to extension with a 12.5 mm trial insert.      Once the cement had fully cured, the excess cement was removed   throughout the knee.  I confirmed I was satisfied with the range of   motion and stability, and the final 12.5 mm PS insert was chosen.  It was   placed into the knee.      The tourniquet had been let down at 32 minutes.  No significant   hemostasis required.  The   extensor mechanism was then reapproximated using #1 Vicryl and #0 V-lock sutures with the knee   in flexion.  The  remaining wound was closed with 2-0 Vicryl and running 4-0 Monocryl.   The knee was cleaned, dried, dressed sterilely using Dermabond and   Aquacel dressing.  The patient was then   brought to recovery room in stable condition, tolerating  the procedure   well.   Please note that Physician Assistant, Nehemiah Massed, was present for the entirety of the case, and was utilized for pre-operative positioning, peri-operative retractor management, general facilitation of the procedure.  He was also utilized for primary wound closure at the end of the case.              Pietro Cassis Alvan Dame, M.D.    02/23/2014 11:31 AM

## 2014-02-23 NOTE — Interval H&P Note (Signed)
History and Physical Interval Note:  02/23/2014 8:55 AM  Caleb Dougherty  has presented today for surgery, with the diagnosis of LEFT KNEE OA  The various methods of treatment have been discussed with the patient and family. After consideration of risks, benefits and other options for treatment, the patient has consented to  Procedure(s): LEFT TOTAL KNEE REPLACEMENT (Left) as a surgical intervention .  The patient's history has been reviewed, patient examined, no change in status, stable for surgery.  I have reviewed the patient's chart and labs.  Questions were answered to the patient's satisfaction.     Mauri Pole

## 2014-02-23 NOTE — Progress Notes (Signed)
CBG 384 at HS.  Wyatt Portela PA paged, new orders received.  Will continue to monitor.

## 2014-02-23 NOTE — Progress Notes (Signed)
[id number]/OHYWVP Rosana Hoes, RN,BSN,CCM:  Case Management [phone]  Chart reviewed for inpatient need and needs  Discharge needs at time of review: none  Next chart review due on [id number].

## 2014-02-23 NOTE — Evaluation (Signed)
Physical Therapy Evaluation Patient Details Name: Caleb Dougherty MRN: [id number] DOB: [birth year] Today's Date: 02/23/2014   History of Present Illness  s/p LTKA   Clinical Impression  Pt s/p LTKA presents with decreased strength and decreased ROM to benefit from PT to increase ability with all mobility and safety to DC home with wife.     Follow Up Recommendations Home health PT    Equipment Recommendations  Rolling walker with 5" wheels    Recommendations for Other Services       Precautions / Restrictions Precautions Precautions: Knee Restrictions Weight Bearing Restrictions: No      Mobility  Bed Mobility Overal bed mobility: Needs Assistance Bed Mobility: Supine to Sit;Sit to Supine     Supine to sit: Min assist     General bed mobility comments: assisted LE to EOB, pt controlled upper body  Transfers Overall transfer level: Needs assistance Equipment used: Rolling walker (2 wheeled) Transfers: Sit to/from Stand Sit to Stand: Min assist         General transfer comment: cues for RW safety and sequencing  Ambulation/Gait Ambulation/Gait assistance: Min guard Ambulation Distance (Feet): 45 Feet Assistive device: Rolling walker (2 wheeled) Gait Pattern/deviations: Step-to pattern     General Gait Details: cues for sequencing and RW safety  Stairs            Wheelchair Mobility    Modified Rankin (Stroke Patients Only)       Balance                                             Pertinent Vitals/Pain 5/10 in knee and iced and position after session    Home Living Family/patient expects to be discharged to:: Private residence Living Arrangements: Spouse/significant other Available Help at Discharge: Family Type of Home: House Home Access: Ramped entrance (built a ramp over one high step prior to surgery)     Home Layout: One level Home Equipment: Cane - single point      Prior Function Level of Independence:  Independent         Comments: very active     Hand Dominance        Extremity/Trunk Assessment               Lower Extremity Assessment: LLE deficits/detail   LLE Deficits / Details: groslly 4/5 quad strength already. ROM 0-45 in supine with heel slides     Communication   Communication: No difficulties  Cognition Arousal/Alertness: Awake/alert Behavior During Therapy: WFL for tasks assessed/performed Overall Cognitive Status: Within Functional Limits for tasks assessed                      General Comments      Exercises Total Joint Exercises Ankle Circles/Pumps: AROM;Both;5 reps;Supine Quad Sets: AROM;Left;5 reps;Supine Heel Slides: AAROM;Left;5 reps;Supine Straight Leg Raises: AAROM;Left;5 reps;Supine      Assessment/Plan    PT Assessment Patient needs continued PT services  PT Diagnosis Difficulty walking;Abnormality of gait;Generalized weakness   PT Problem List Decreased strength;Decreased range of motion;Decreased activity tolerance;Decreased balance;Decreased mobility;Decreased safety awareness  PT Treatment Interventions DME instruction;Gait training;Functional mobility training;Therapeutic activities;Therapeutic exercise;Patient/family education   PT Goals (Current goals can be found in the Care Plan section) Acute Rehab PT Goals Patient Stated Goal: Pt ready to get going and be active again PT Goal Formulation: With  patient Time For Goal Achievement: 03/02/14 Potential to Achieve Goals: Good    Frequency 7X/week   Barriers to discharge        Co-evaluation               End of Session Equipment Utilized During Treatment: Gait belt Activity Tolerance: Patient tolerated treatment well Patient left: in bed;with call bell/phone within reach;with family/visitor present Nurse Communication: Mobility status         Time: 1610-9604 PT Time Calculation (min): 26 min   Charges:   PT Evaluation $Initial PT Evaluation Tier  I: 1 Procedure PT Treatments $Gait Training: 8-22 mins $Therapeutic Activity: 8-22 mins   PT G CodesClide Dales 02/23/2014, 5:13 PM Clide Dales, PT Pager: [phone] 02/23/2014

## 2014-02-24 ENCOUNTER — Encounter (HOSPITAL_COMMUNITY): Payer: Self-pay | Admitting: Orthopedic Surgery

## 2014-02-24 DIAGNOSIS — E663 Overweight: Secondary | ICD-10-CM | POA: Diagnosis present

## 2014-02-24 DIAGNOSIS — D62 Acute posthemorrhagic anemia: Secondary | ICD-10-CM | POA: Diagnosis not present

## 2014-02-24 LAB — BASIC METABOLIC PANEL
Anion gap: 8 (ref 5–15)
BUN: 31 mg/dL — AB (ref 6–23)
CO2: 24 mEq/L (ref 19–32)
CREATININE: 1.19 mg/dL (ref 0.50–1.35)
Calcium: 8.3 mg/dL — ABNORMAL LOW (ref 8.4–10.5)
Chloride: 103 mEq/L (ref 96–112)
GFR calc Af Amer: 63 mL/min — ABNORMAL LOW (ref >90)
GFR, EST NON AFRICAN AMERICAN: 55 mL/min — AB (ref >90)
Glucose, Bld: 304 mg/dL — ABNORMAL HIGH (ref 70–99)
Potassium: 5.4 mEq/L — ABNORMAL HIGH (ref 3.7–5.3)
Sodium: 135 mEq/L — ABNORMAL LOW (ref 137–147)

## 2014-02-24 LAB — CBC
HCT: 33.9 % — ABNORMAL LOW (ref 39.0–52.0)
Hemoglobin: 11.3 g/dL — ABNORMAL LOW (ref 13.0–17.0)
MCH: 30.1 pg (ref 26.0–34.0)
MCHC: 33.3 g/dL (ref 30.0–36.0)
MCV: 90.4 fL (ref 78.0–100.0)
Platelets: 141 10*3/uL — ABNORMAL LOW (ref 150–400)
RBC: 3.75 MIL/uL — ABNORMAL LOW (ref 4.22–5.81)
RDW: 12.4 % (ref 11.5–15.5)
WBC: 10.8 10*3/uL — ABNORMAL HIGH (ref 4.0–10.5)

## 2014-02-24 MED ORDER — ASPIRIN 325 MG PO TBEC: 325.0000 mg | DELAYED_RELEASE_TABLET | Freq: Two times a day (BID) | ORAL | Status: AC

## 2014-02-24 MED ORDER — POLYETHYLENE GLYCOL 3350 17 G PO PACK: 17.0000 g | PACK | Freq: Two times a day (BID) | ORAL | Status: DC

## 2014-02-24 MED ORDER — FERROUS SULFATE 325 (65 FE) MG PO TABS: 325.0000 mg | ORAL_TABLET | Freq: Three times a day (TID) | ORAL | Status: DC

## 2014-02-24 MED ORDER — DSS 100 MG PO CAPS
100.0000 mg | ORAL_CAPSULE | Freq: Two times a day (BID) | ORAL | Status: DC
Start: 2014-02-24 — End: 2014-05-17

## 2014-02-24 MED ORDER — TIZANIDINE HCL 4 MG PO TABS: 4.0000 mg | ORAL_TABLET | Freq: Four times a day (QID) | ORAL | Status: DC | PRN

## 2014-02-24 MED ORDER — OXYCODONE HCL 5 MG PO TABS: 5.0000 mg | ORAL_TABLET | ORAL | Status: DC | PRN

## 2014-02-24 NOTE — Evaluation (Signed)
Occupational Therapy Evaluation Patient Details Name: Caleb Dougherty MRN: [id number] DOB: 03-Feb-1929 Today's Date: 02/24/2014    History of Present Illness s/p LTKA    Clinical Impression   Pt supposed to d/c today. Educated on 3in1 transfers and shower transfer. Educated on LB dressing and how wife can assist with tasks for safety.     Follow Up Recommendations  No OT follow up    Equipment Recommendations  3 in 1 bedside comode    Recommendations for Other Services       Precautions / Restrictions Precautions Precautions: Knee Restrictions Weight Bearing Restrictions: No      Mobility Bed Mobility                  Transfers Overall transfer level: Needs assistance Equipment used: Rolling walker (2 wheeled) Transfers: Sit to/from Stand Sit to Stand: Min guard         General transfer comment: verbal cues for hand placement and to back up fully to 3in1 before letting go of walker.    Balance                                            ADL Overall ADL's : Needs assistance/impaired Eating/Feeding: Independent;Sitting   Grooming: Wash/dry hands;Set up;Sitting   Upper Body Bathing: Set up;Sitting   Lower Body Bathing: Minimal assistance;Sit to/from stand   Upper Body Dressing : Set up;Sitting   Lower Body Dressing: Minimal assistance;Sit to/from stand   Toilet Transfer: Min guard;Ambulation;BSC;RW   Toileting- Water quality scientist and Hygiene: Min guard;Sit to/from stand   Tub/ Shower Transfer: Min guard;Walk-in shower;Rolling walker     General ADL Comments: Explained uses of 3in1 including how to use as shower chair. Discussed placement of 3in1 in shower for safety toward the front of the shower and has a hand held shower. Educated on how to adjust 3in1 for appropriate height also. Wife helped with LB dressing this am. Discussed sequence for LB dressing though pt states his R knee is also bad and will have to have surgery on  it in future so he isnt sure if it will be easier to don shorts over L foot first but states he will try at home. Wife donned over R LE first today. Min guard assist with 3in1 transfer for tight space in bathroom.      Vision                     Perception     Praxis      Pertinent Vitals/Pain 4/10 at start of session. Pt states pain decreasing from a 4 by the end of the session. Reposition, ice.     Hand Dominance     Extremity/Trunk Assessment Upper Extremity Assessment Upper Extremity Assessment: Overall WFL for tasks assessed           Communication Communication Communication: No difficulties   Cognition Arousal/Alertness: Awake/alert Behavior During Therapy: WFL for tasks assessed/performed Overall Cognitive Status: Within Functional Limits for tasks assessed                     General Comments       Exercises       Shoulder Instructions      Home Living Family/patient expects to be discharged to:: Private residence Living Arrangements: Spouse/significant other Available Help at Discharge: Family Type of  Home: House Home Access: Ramped entrance (built a ramp over one high step prior to surgery)     Home Layout: One level     Bathroom Shower/Tub: Occupational psychologist: Standard     Home Equipment: Cane - single point          Prior Functioning/Environment Level of Independence: Independent        Comments: very active    OT Diagnosis:     OT Problem List:     OT Treatment/Interventions:      OT Goals(Current goals can be found in the care plan section) Acute Rehab OT Goals Patient Stated Goal: Pt ready to get going and be active again  OT Frequency:     Barriers to D/C:            Co-evaluation              End of Session Equipment Utilized During Treatment: Rolling walker  Activity Tolerance: Patient tolerated treatment well Patient left: in chair;with call bell/phone within reach;with  family/visitor present   Time: 1017-5102 OT Time Calculation (min): 18 min Charges:  OT General Charges $OT Visit: 1 Procedure OT Evaluation $Initial OT Evaluation Tier I: 1 Procedure OT Treatments $Therapeutic Activity: 8-22 mins G-Codes:    Jules Schick [phone] 02/24/2014, 10:54 AM

## 2014-02-24 NOTE — Discharge Summary (Signed)
Physician Discharge Summary  Patient ID: Caleb Dougherty MRN: [id number] DOB/AGE: 1928/08/04 80 y.o.  Admit date: 02/23/2014 Discharge date: 02/24/2014   Procedures:  Procedure(s) (LRB): LEFT TOTAL KNEE REPLACEMENT (Left)  Attending Physician:  Dr. Paralee Cancel   Admission Diagnoses:   Left knee OA Caleb Dougherty  Discharge Diagnoses:  Principal Problem:   S/P left TKA Active Problems:   Postoperative anemia due to acute blood loss   Overweight (BMI 25.0-29.9)  Past Medical History  Diagnosis Date  . Wears dentures   . Wears glasses     reading  . Hypertension   . GERD (gastroesophageal reflux disease)   . BPH (benign prostatic hypertrophy)   . Cataract     02-12-14 left- ready for surgery, but only has had right done  . Impaired hearing     hard of hearing -no hearing aids  . Diabetes mellitus without complication     diabetes x4 yrs-oral med and Lantus used  . Arthritis     osteoarthritis-knees and feet  . Cancer     melanoma(back)-no futher issues. Skin cancers face "frozen"    HPI: Caleb Dougherty, 80 y.o. male, has a history of pain and functional disability in the left knee due to arthritis and has failed non-surgical conservative treatments for greater than 12 weeks to includeNSAID's and/or analgesics, corticosteriod injections and activity modification. Onset of symptoms was gradual, starting 3+ years ago with gradually worsening course since that time. The patient noted no past surgery on the left knee(s). Patient currently rates pain in the left knee(s) at 10 out of 10 with activity. Patient has worsening of pain with activity and weight bearing, pain that interferes with activities of daily living, pain with passive range of motion, crepitus and joint swelling. Patient has evidence of periarticular osteophytes and joint space narrowing by imaging studies. There is no active infection. Risks, benefits and expectations were discussed with the patient. Risks including but  not limited to the risk of anesthesia, blood clots, nerve damage, blood vessel damage, failure of the prosthesis, infection and up to and including death. Patient understand the risks, benefits and expectations and wishes to proceed with surgery.   PCP: [name], MD   Discharged Condition: good  Hospital Course:  Patient underwent the above stated procedure on 02/23/2014. Patient tolerated the procedure well and brought to the recovery room in good condition and subsequently to the floor.  POD #1 BP: 132/55 ; Pulse: 65 ; Temp: 98.5 F (36.9 C) ; Resp: 20  Patient reports pain as mild, pain controlled. No events throughout the night. Ready to be discharged home. Dorsiflexion/plantar flexion intact, incision: dressing C/D/I, no cellulitis present and compartment soft.   LABS  Basename    HGB  11.3  HCT  33.9    Discharge Exam: General appearance: alert, cooperative and no distress Extremities: Homans sign is negative, no sign of DVT, no edema, redness or tenderness in the calves or thighs and no ulcers, gangrene or trophic changes  Disposition: Home  with follow up in 2 weeks   Follow-up Information   Follow up with Mauri Pole, MD. Schedule an appointment as soon as possible for a visit in 2 weeks.   Specialty:  Orthopedic Surgery   Contact information:   [address] [place] [address] [phone]       Discharge Instructions   Call MD / Call 911    Complete by:  As directed   If you experience chest pain  or shortness of breath, CALL 911 and be transported to the hospital emergency room.  If you develope a fever above 101 F, pus (white drainage) or increased drainage or redness at the wound, or calf pain, call your surgeon's office.     Change dressing    Complete by:  As directed   Maintain surgical dressing for 10-14 days, or until follow up in the clinic.     Constipation Prevention    Complete by:  As directed   Drink plenty of fluids.   Prune juice may be helpful.  You may use a stool softener, such as Colace (over the counter) 100 mg twice a day.  Use MiraLax (over the counter) for constipation as needed.     Diet - low sodium heart healthy    Complete by:  As directed      Discharge instructions    Complete by:  As directed   Maintain surgical dressing for 10-14 days, or until follow up in the clinic. Follow up in 2 weeks at [facility]. Call with any questions or concerns.     Increase activity slowly as tolerated    Complete by:  As directed      TED hose    Complete by:  As directed   Use stockings (TED hose) for 2 weeks on both leg(s).  You may remove them at night for sleeping.     Weight bearing as tolerated    Complete by:  As directed   Laterality:  left  Extremity:  Lower             Medication List    STOP taking these medications       acetaminophen-codeine 300-30 MG per tablet  Commonly known as:  TYLENOL #3     aspirin 81 MG tablet  Replaced by:  aspirin 325 MG EC tablet     FIBER CHOICE 1.5 G Chew  Generic drug:  Inulin     naproxen sodium 220 MG tablet  Commonly known as:  ANAPROX      TAKE these medications       aspirin 325 MG EC tablet  Take 1 tablet (325 mg total) by mouth 2 (two) times daily.     DSS 100 MG Caps  Take 100 mg by mouth 2 (two) times daily.     ferrous sulfate 325 (65 FE) MG tablet  Take 1 tablet (325 mg total) by mouth 3 (three) times daily after meals.     fosinopril 40 MG tablet  Commonly known as:  MONOPRIL  Take 40 mg by mouth every evening.     glyBURIDE 5 MG tablet  Commonly known as:  DIABETA  Take 10 mg by mouth 2 (two) times daily with a meal. Takes 2=10mg      hydrochlorothiazide 25 MG tablet  Commonly known as:  HYDRODIURIL  Take 25 mg by mouth every evening.     insulin glargine 100 UNIT/ML injection  Commonly known as:  LANTUS  Inject 25 Units into the skin every morning.     multivitamin with minerals Tabs tablet  Take 1  tablet by mouth daily.     oxyCODONE 5 MG immediate release tablet  Commonly known as:  Oxy IR/ROXICODONE  Take 1-3 tablets (5-15 mg total) by mouth every 4 (four) hours as needed for severe pain.     polyethylene glycol packet  Commonly known as:  MIRALAX / GLYCOLAX  Take 17 g by mouth 2 (two) times daily.  simvastatin 40 MG tablet  Commonly known as:  ZOCOR  Take 40 mg by mouth every evening.     terazosin 5 MG capsule  Commonly known as:  HYTRIN  Take 5 mg by mouth at bedtime.     tiZANidine 4 MG tablet  Commonly known as:  ZANAFLEX  Take 1 tablet (4 mg total) by mouth every 6 (six) hours as needed for muscle spasms.         Signed: West Pugh. Linley Moskal   PA-C  02/24/2014, 12:38 PM

## 2014-02-24 NOTE — Progress Notes (Signed)
   Subjective: 1 Day Post-Op Procedure(s) (LRB): LEFT TOTAL KNEE REPLACEMENT (Left)   Patient reports pain as mild, pain controlled. No events throughout the night. Ready to be discharged home if he does well with PT.  Objective:   VITALS:   Filed Vitals:   02/24/14 0500  BP: 132/55  Pulse: 65  Temp: 98.5 F (36.9 C)  Resp: 20    Dorsiflexion/Plantar flexion intact Incision: dressing C/D/I No cellulitis present Compartment soft  LABS  Recent Labs  02/24/14 0404  HGB 11.3*  HCT 33.9*  WBC 10.8*  PLT 141*     Recent Labs  02/24/14 0404  NA 135*  K 5.4*  BUN 31*  CREATININE 1.19  GLUCOSE 304*     Assessment/Plan: 1 Day Post-Op Procedure(s) (LRB): LEFT TOTAL KNEE REPLACEMENT (Left) Foley cath d/c'ed Advance diet Up with therapy D/C IV fluids Discharge home with home health Follow up in 2 weeks at [facility]. Follow up with OLIN,Amiley Shishido D in 2 weeks.  Contact information:  [facility] [address], [place] [phone]    Expected ABLA  Treated with iron and will observe  Overweight (BMI 25-29.9) Estimated body mass index is 26.16 kg/(m^2) as calculated from the following:   Height as of this encounter: 5\' 8"  (1.727 m).   Weight as of this encounter: 78.019 kg (172 lb). Patient also counseled that weight may inhibit the healing process Patient counseled that losing weight will help with future health issues        West Pugh. Lolah Coghlan   PAC  02/24/2014, 8:26 AM

## 2014-02-24 NOTE — Progress Notes (Signed)
Physical Therapy Treatment Patient Details Name: Caleb Dougherty MRN: [id number] DOB: Dec 07, 1928 Today's Date: 03-15-2014    History of Present Illness s/p LTKA     PT Comments    Highly motivated and progressing well  Follow Up Recommendations  Home health PT     Equipment Recommendations  Rolling walker with 5" wheels    Recommendations for Other Services       Precautions / Restrictions Precautions Precautions: Knee Restrictions Weight Bearing Restrictions: No    Mobility  Bed Mobility Overal bed mobility: Modified Independent Bed Mobility: Supine to Sit;Sit to Supine     Supine to sit: Modified independent (Device/Increase time) Sit to supine: Modified independent (Device/Increase time)      Transfers Overall transfer level: Needs assistance Equipment used: Rolling walker (2 wheeled) Transfers: Sit to/from Stand Sit to Stand: Min guard         General transfer comment: verbal cues for hand placement and to back up fully to 3in1 before letting go of walker.  Ambulation/Gait Ambulation/Gait assistance: Min guard;Supervision Ambulation Distance (Feet): 200 Feet Assistive device: Rolling walker (2 wheeled) Gait Pattern/deviations: Step-to pattern;Step-through pattern;Shuffle;Trunk flexed     General Gait Details: cues for sequencing and RW safety   Stairs            Wheelchair Mobility    Modified Rankin (Stroke Patients Only)       Balance                                    Cognition Arousal/Alertness: Awake/alert Behavior During Therapy: WFL for tasks assessed/performed Overall Cognitive Status: Within Functional Limits for tasks assessed                      Exercises Total Joint Exercises Ankle Circles/Pumps: AROM;Both;Supine;15 reps Quad Sets: AROM;Supine;10 reps;Both Heel Slides: AAROM;Left;Supine;15 reps Straight Leg Raises: Left;Supine;AAROM;AROM;15 reps Goniometric ROM: AAROM L knee - 10 - 110    General Comments        Pertinent Vitals/Pain 3-4/10; premed, ice packs provided    Home Living Family/patient expects to be discharged to:: Private residence Living Arrangements: Spouse/significant other Available Help at Discharge: Family Type of Home: House Home Access: Ramped entrance (built a ramp over one high step prior to surgery)   Home Layout: One level Home Equipment: Cane - single point      Prior Function Level of Independence: Independent      Comments: very active   PT Goals (current goals can now be found in the care plan section) Acute Rehab PT Goals Patient Stated Goal: Pt ready to get going and be active again PT Goal Formulation: With patient Time For Goal Achievement: 03/02/14 Potential to Achieve Goals: Good Progress towards PT goals: Progressing toward goals    Frequency  7X/week    PT Plan Current plan remains appropriate    Co-evaluation             End of Session Equipment Utilized During Treatment: Gait belt Activity Tolerance: Patient tolerated treatment well Patient left: in chair;with call bell/phone within reach;with family/visitor present     Time: 1001-1032 PT Time Calculation (min): 31 min  Charges:  $Gait Training: 8-22 mins $Therapeutic Exercise: 8-22 mins                    G Codes:      Mahari Vankirk Mar 15, 2014, 11:36 AM

## 2014-02-25 LAB — GLUCOSE, CAPILLARY
Glucose-Capillary: 307 mg/dL — ABNORMAL HIGH (ref 70–99)
Glucose-Capillary: 301 mg/dL — ABNORMAL HIGH (ref 70–99)

## 2014-05-10 ENCOUNTER — Ambulatory Visit (HOSPITAL_COMMUNITY)
Admission: RE | Admit: 2014-05-10 | Discharge: 2014-05-10 | Disposition: A | Discharge status: Home / Self Care | Payer: Medicare Other | Source: Ambulatory Visit | Attending: Diagnostic Radiology | Admitting: Diagnostic Radiology

## 2014-05-10 ENCOUNTER — Other Ambulatory Visit (HOSPITAL_COMMUNITY): Payer: Self-pay | Admitting: Sports Medicine

## 2014-05-10 DIAGNOSIS — M25512 Pain in left shoulder: Secondary | ICD-10-CM

## 2014-05-10 DIAGNOSIS — M795 Residual foreign body in soft tissue: Secondary | ICD-10-CM | POA: Insufficient documentation

## 2014-05-10 DIAGNOSIS — Z578 Occupational exposure to other risk factors: Secondary | ICD-10-CM | POA: Insufficient documentation

## 2014-05-14 ENCOUNTER — Ambulatory Visit (HOSPITAL_COMMUNITY)
Admission: RE | Admit: 2014-05-14 | Discharge: 2014-05-14 | Disposition: A | Discharge status: Home / Self Care | Payer: Medicare Other | Source: Ambulatory Visit | Attending: Sports Medicine | Admitting: Sports Medicine

## 2014-05-14 ENCOUNTER — Other Ambulatory Visit (HOSPITAL_COMMUNITY): Payer: Self-pay | Admitting: Sports Medicine

## 2014-05-14 DIAGNOSIS — Z1889 Other specified retained foreign body fragments: Secondary | ICD-10-CM | POA: Insufficient documentation

## 2014-05-14 DIAGNOSIS — M25512 Pain in left shoulder: Secondary | ICD-10-CM | POA: Insufficient documentation

## 2014-05-14 NOTE — H&P (Signed)
TOTAL KNEE ADMISSION H&P  Patient is being admitted for right total knee arthroplasty.  Subjective:  Chief Complaint:   Right knee OA / pain.  HPI: Caleb Dougherty, 80 y.o. male, has a history of pain and functional disability in the right knee due to arthritis and has failed non-surgical conservative treatments for greater than 12 weeks to include NSAID's and/or analgesics, corticosteriod injections, use of assistive devices and activity modification.  Onset of symptoms was gradual, starting 3+ years ago with gradually worsening course since that time. The patient noted prior procedures on the knee to include  arthroplasty on the left knee(s).  Patient currently rates pain in the left knee(s) at 8 out of 10 with activity. Patient has worsening of pain with activity and weight bearing, pain that interferes with activities of daily living, pain with passive range of motion, crepitus and joint swelling.  Patient has evidence of periarticular osteophytes and joint space narrowing by imaging studies.  There is no active infection.  Risks, benefits and expectations were discussed with the patient.  Risks including but not limited to the risk of anesthesia, blood clots, nerve damage, blood vessel damage, failure of the prosthesis, infection and up to and including death.  Patient understand the risks, benefits and expectations and wishes to proceed with surgery.   PCP: [name], MD  D/C Plans:      Home with HHPT  Post-op Meds:       No Rx given  Tranexamic Acid:      To be given - IV    Decadron:      Is to be given  FYI:     ASA post-op  Oxycodone post-op    Patient Active Problem List   Diagnosis Date Noted  . Postoperative anemia due to acute blood loss 02/24/2014  . Overweight (BMI 25.0-29.9) 02/24/2014  . S/P left TKA 02/23/2014   Past Medical History  Diagnosis Date  . Wears dentures   . Wears glasses     reading  . Hypertension   . GERD (gastroesophageal reflux disease)    . BPH (benign prostatic hypertrophy)   . Cataract     02-12-14 left- ready for surgery, but only has had right done  . Impaired hearing     hard of hearing -no hearing aids  . Diabetes mellitus without complication     diabetes x4 yrs-oral med and Lantus used  . Arthritis     osteoarthritis-knees and feet  . Cancer     melanoma(back)-no futher issues. Skin cancers face "frozen"    Past Surgical History  Procedure Laterality Date  . Colonoscopy    . Cataract extraction Right   . Carpal tunnel release Right 06/23/2013    Procedure: RIGHT CARPAL TUNNEL RELEASE;  Surgeon: Cammie Sickle., MD;  Location: [place];  Service: Orthopedics;  Laterality: Right;  . Total knee arthroplasty Left 02/23/2014    Procedure: LEFT TOTAL KNEE REPLACEMENT;  Surgeon: Mauri Pole, MD;  Location: [facility];  Service: Orthopedics;  Laterality: Left;    No prescriptions prior to admission   No Known Allergies   History  Substance Use Topics  . Smoking status: Never Smoker   . Smokeless tobacco: Not on file  . Alcohol Use: No       Review of Systems  Constitutional: Negative.   HENT: Positive for hearing loss.   Eyes: Negative.   Respiratory: Negative.   Cardiovascular: Negative.   Gastrointestinal: Positive for heartburn.  Genitourinary: Negative.   Musculoskeletal: Positive for joint pain.  Skin: Negative.   Neurological: Negative.   Endo/Heme/Allergies: Negative.   Psychiatric/Behavioral: Negative.     Objective:  Physical Exam  Constitutional: He is oriented to person, place, and time. He appears well-developed and well-nourished.  HENT:  Head: Normocephalic and atraumatic.  Mouth/Throat: Oropharynx is clear and moist.  Eyes: Pupils are equal, round, and reactive to light.  Neck: Neck supple. No JVD present. No tracheal deviation present. No thyromegaly present.  Cardiovascular: Normal rate, regular rhythm, normal heart sounds and intact distal pulses.    Respiratory: Effort normal and breath sounds normal. No stridor. No respiratory distress. He has no wheezes.  GI: Soft. There is no tenderness. There is no guarding.  Musculoskeletal:       Right knee: He exhibits decreased range of motion and bony tenderness. He exhibits no ecchymosis, no deformity, no laceration, no erythema and normal alignment. Tenderness found.  Lymphadenopathy:    He has no cervical adenopathy.  Neurological: He is alert and oriented to person, place, and time.  Skin: Skin is warm and dry.  Psychiatric: He has a normal mood and affect.     Labs:  Estimated body mass index is 26.16 kg/(m^2) as calculated from the following:   Height as of 02/23/14: 5\' 8"  (1.727 m).   Weight as of 02/12/14: 78.019 kg (172 lb).   Imaging Review Plain radiographs demonstrate severe degenerative joint disease of the right knee(s). The overall alignment is neutral. The bone quality appears to be good for age and reported activity level.  Assessment/Plan:  End stage arthritis, right knee   The patient history, physical examination, clinical judgment of the provider and imaging studies are consistent with end stage degenerative joint disease of the right knee(s) and total knee arthroplasty is deemed medically necessary. The treatment options including medical management, injection therapy arthroscopy and arthroplasty were discussed at length. The risks and benefits of total knee arthroplasty were presented and reviewed. The risks due to aseptic loosening, infection, stiffness, patella tracking problems, thromboembolic complications and other imponderables were discussed. The patient acknowledged the explanation, agreed to proceed with the plan and consent was signed. Patient is being admitted for inpatient treatment for surgery, pain control, PT, OT, prophylactic antibiotics, VTE prophylaxis, progressive ambulation and ADL's and discharge planning. The patient is planning to be discharged  home with home health services.      West Pugh Samer Dutton   PA-C  05/14/2014, 11:23 AM

## 2014-05-17 ENCOUNTER — Encounter (HOSPITAL_COMMUNITY): Payer: Self-pay | Admitting: Pharmacy Technician

## 2014-05-20 NOTE — Patient Instructions (Addendum)
Caleb Dougherty  05/20/2014   Your procedure is scheduled on:05/25/2014      Report to Emergency Room entrance  Follow the Signs to [facility] at   0600     am  Call this number if you have problems the morning of surgery: [phone]   Remember: Eat a good healthy snack prior to bedtime    Do not eat food or drink liquids after midnight.   Take these medicines the morning of surgery with A SIP OF WATER: none               No diabetic medications am of surgery   Do not wear jewelry,   Do not wear lotions, powders, or perfumes.  deodorant.   Men may shave face and neck.  Do not bring valuables to the hospital.  Contacts, dentures or bridgework may not be worn into surgery.  Leave suitcase in the car. After surgery it may be brought to your room.  For patients admitted to the hospital, checkout time is 11:00 AM the day of  discharge.       Please read over the following fact sheets that you were given: MRSA Information, coughing and deep breathing exercises, leg exercises            [facility] - Preparing for Surgery Before surgery, you can play an important role.  Because skin is not sterile, your skin needs to be as free of germs as possible.  You can reduce the number of germs on your skin by washing with CHG (chlorahexidine gluconate) soap before surgery.  CHG is an antiseptic cleaner which kills germs and bonds with the skin to continue killing germs even after washing. Please DO NOT use if you have an allergy to CHG or antibacterial soaps.  If your skin becomes reddened/irritated stop using the CHG and inform your nurse when you arrive at Short Stay. Do not shave (including legs and underarms) for at least 48 hours prior to the first CHG shower.  You may shave your face/neck. Please follow these instructions carefully:  1.  Shower with CHG Soap the night before surgery and the  morning of Surgery.  2.  If you choose to wash your hair, wash your hair first as usual with your   normal  shampoo.  3.  After you shampoo, rinse your hair and body thoroughly to remove the  shampoo.                           4.  Use CHG as you would any other liquid soap.  You can apply chg directly  to the skin and wash                       Gently with a scrungie or clean washcloth.  5.  Apply the CHG Soap to your body ONLY FROM THE NECK DOWN.   Do not use on face/ open                           Wound or open sores. Avoid contact with eyes, ears mouth and genitals (private parts).                       Wash face,  Genitals (private parts) with your normal soap.  6.  Wash thoroughly, paying special attention to the area where your surgery  will be performed.  7.  Thoroughly rinse your body with warm water from the neck down.  8.  DO NOT shower/wash with your normal soap after using and rinsing off  the CHG Soap.                9.  Pat yourself dry with a clean towel.            10.  Wear clean pajamas.            11.  Place clean sheets on your bed the night of your first shower and do not  sleep with pets. Day of Surgery : Do not apply any lotions/deodorants the morning of surgery.  Please wear clean clothes to the hospital/surgery center.  FAILURE TO FOLLOW THESE INSTRUCTIONS MAY RESULT IN THE CANCELLATION OF YOUR SURGERY PATIENT SIGNATURE_________________________________  NURSE SIGNATURE__________________________________  ________________________________________________________________________   Adam Phenix  An incentive spirometer is a tool that can help keep your lungs clear and active. This tool measures how well you are filling your lungs with each breath. Taking long deep breaths may help reverse or decrease the chance of developing breathing (pulmonary) problems (especially infection) following:  A long period of time when you are unable to move or be active. BEFORE THE PROCEDURE   If the spirometer includes an indicator to show your best effort, your  nurse or respiratory therapist will set it to a desired goal.  If possible, sit up straight or lean slightly forward. Try not to slouch.  Hold the incentive spirometer in an upright position. INSTRUCTIONS FOR USE  1. Sit on the edge of your bed if possible, or sit up as far as you can in bed or on a chair. 2. Hold the incentive spirometer in an upright position. 3. Breathe out normally. 4. Place the mouthpiece in your mouth and seal your lips tightly around it. 5. Breathe in slowly and as deeply as possible, raising the piston or the ball toward the top of the column. 6. Hold your breath for 3-5 seconds or for as long as possible. Allow the piston or ball to fall to the bottom of the column. 7. Remove the mouthpiece from your mouth and breathe out normally. 8. Rest for a few seconds and repeat Steps 1 through 7 at least 10 times every 1-2 hours when you are awake. Take your time and take a few normal breaths between deep breaths. 9. The spirometer may include an indicator to show your best effort. Use the indicator as a goal to work toward during each repetition. 10. After each set of 10 deep breaths, practice coughing to be sure your lungs are clear. If you have an incision (the cut made at the time of surgery), support your incision when coughing by placing a pillow or rolled up towels firmly against it. Once you are able to get out of bed, walk around indoors and cough well. You may stop using the incentive spirometer when instructed by your caregiver.  RISKS AND COMPLICATIONS  Take your time so you do not get dizzy or light-headed.  If you are in pain, you may need to take or ask for pain medication before doing incentive spirometry. It is harder to take a deep breath if you are having pain. AFTER USE  Rest and breathe slowly and easily.  It can be helpful to keep track of a log of your  progress. Your caregiver can provide you with a simple table to help with this. If you are using the  spirometer at home, follow these instructions: [place] IF:   You are having difficultly using the spirometer.  You have trouble using the spirometer as often as instructed.  Your pain medication is not giving enough relief while using the spirometer.  You develop fever of 100.5 F (38.1 C) or higher. SEEK IMMEDIATE MEDICAL CARE IF:   You cough up bloody sputum that had not been present before.  You develop fever of 102 F (38.9 C) or greater.  You develop worsening pain at or near the incision site. MAKE SURE YOU:   Understand these instructions.  Will watch your condition.  Will get help right away if you are not doing well or get worse. Document Released: 11/26/2006 Document Revised: 10/08/2011 Document Reviewed: 01/27/2007 Exit[employer] Patient Information 2014 [employer], [place].   ________________________________________________________________________    WHAT IS A BLOOD TRANSFUSION? Blood Transfusion Information  A transfusion is the replacement of blood or some of its parts. Blood is made up of multiple cells which provide different functions.  Red blood cells carry oxygen and are used for blood loss replacement.  White blood cells fight against infection.  Platelets control bleeding.  Plasma helps clot blood.  Other blood products are available for specialized needs, such as hemophilia or other clotting disorders. BEFORE THE TRANSFUSION  Who gives blood for transfusions?   Healthy volunteers who are fully evaluated to make sure their blood is safe. This is blood bank blood. Transfusion therapy is the safest it has ever been in the practice of medicine. Before blood is taken from a donor, a complete history is taken to make sure that person has no history of diseases nor engages in risky social behavior (examples are intravenous drug use or sexual activity with multiple partners). The donor's travel history is screened to minimize risk of transmitting  infections, such as malaria. The donated blood is tested for signs of infectious diseases, such as HIV and hepatitis. The blood is then tested to be sure it is compatible with you in order to minimize the chance of a transfusion reaction. If you or a relative donates blood, this is often done in anticipation of surgery and is not appropriate for emergency situations. It takes many days to process the donated blood. RISKS AND COMPLICATIONS Although transfusion therapy is very safe and saves many lives, the main dangers of transfusion include:   Getting an infectious disease.  Developing a transfusion reaction. This is an allergic reaction to something in the blood you were given. Every precaution is taken to prevent this. The decision to have a blood transfusion has been considered carefully by your caregiver before blood is given. Blood is not given unless the benefits outweigh the risks. AFTER THE TRANSFUSION  Right after receiving a blood transfusion, you will usually feel much better and more energetic. This is especially true if your red blood cells have gotten low (anemic). The transfusion raises the level of the red blood cells which carry oxygen, and this usually causes an energy increase.  The nurse administering the transfusion will monitor you carefully for complications. HOME CARE INSTRUCTIONS  No special instructions are needed after a transfusion. You may find your energy is better. Speak with your caregiver about any limitations on activity for underlying diseases you may have. SEEK MEDICAL CARE IF:   Your condition is not improving after your transfusion.  You develop  redness or irritation at the intravenous (IV) site. SEEK IMMEDIATE MEDICAL CARE IF:  Any of the following symptoms occur over the next 12 hours:  Shaking chills.  You have a temperature by mouth above 102 F (38.9 C), not controlled by medicine.  Chest, back, or muscle pain.  People around you feel you are  not acting correctly or are confused.  Shortness of breath or difficulty breathing.  Dizziness and fainting.  You get a rash or develop hives.  You have a decrease in urine output.  Your urine turns a dark color or changes to pink, red, or brown. Any of the following symptoms occur over the next 10 days:  You have a temperature by mouth above 102 F (38.9 C), not controlled by medicine.  Shortness of breath.  Weakness after normal activity.  The white part of the eye turns yellow (jaundice).  You have a decrease in the amount of urine or are urinating less often.  Your urine turns a dark color or changes to pink, red, or brown. Document Released: 07/13/2000 Document Revised: 10/08/2011 Document Reviewed: 03/01/2008 [facility] Patient Information 2014 [place], [place].  _______________________________________________________________________

## 2014-05-21 ENCOUNTER — Encounter (HOSPITAL_COMMUNITY)
Admission: RE | Admit: 2014-05-21 | Discharge: 2014-05-21 | Disposition: A | Discharge status: Home / Self Care | Payer: Medicare Other | Source: Ambulatory Visit | Attending: Orthopedic Surgery | Admitting: Orthopedic Surgery

## 2014-05-21 ENCOUNTER — Encounter (HOSPITAL_COMMUNITY): Payer: Self-pay

## 2014-05-21 DIAGNOSIS — Z01812 Encounter for preprocedural laboratory examination: Secondary | ICD-10-CM | POA: Diagnosis present

## 2014-05-21 LAB — URINALYSIS, ROUTINE W REFLEX MICROSCOPIC
Bilirubin Urine: NEGATIVE
GLUCOSE, UA: NEGATIVE mg/dL
Hgb urine dipstick: NEGATIVE
Ketones, ur: NEGATIVE mg/dL
LEUKOCYTES UA: NEGATIVE
Nitrite: NEGATIVE
Protein, ur: NEGATIVE mg/dL
Specific Gravity, Urine: 1.018 (ref 1.005–1.030)
Urobilinogen, UA: 0.2 mg/dL (ref 0.0–1.0)
pH: 5.5 (ref 5.0–8.0)

## 2014-05-21 LAB — BASIC METABOLIC PANEL
Anion gap: 11 (ref 5–15)
BUN: 28 mg/dL — ABNORMAL HIGH (ref 6–23)
CO2: 26 mEq/L (ref 19–32)
Calcium: 9.4 mg/dL (ref 8.4–10.5)
Chloride: 101 mEq/L (ref 96–112)
Creatinine, Ser: 1.15 mg/dL (ref 0.50–1.35)
GFR calc Af Amer: 66 mL/min — ABNORMAL LOW (ref >90)
GFR calc non Af Amer: 57 mL/min — ABNORMAL LOW (ref >90)
GLUCOSE: 216 mg/dL — AB (ref 70–99)
POTASSIUM: 4.7 meq/L (ref 3.7–5.3)
SODIUM: 138 meq/L (ref 137–147)

## 2014-05-21 LAB — CBC
HCT: 40.4 % (ref 39.0–52.0)
Hemoglobin: 13.1 g/dL (ref 13.0–17.0)
MCH: 29.2 pg (ref 26.0–34.0)
MCHC: 32.4 g/dL (ref 30.0–36.0)
MCV: 90 fL (ref 78.0–100.0)
Platelets: 159 10*3/uL (ref 150–400)
RBC: 4.49 MIL/uL (ref 4.22–5.81)
RDW: 13.1 % (ref 11.5–15.5)
WBC: 5.4 10*3/uL (ref 4.0–10.5)

## 2014-05-21 LAB — SURGICAL PCR SCREEN
MRSA, PCR: NEGATIVE
Staphylococcus aureus: NEGATIVE

## 2014-05-21 LAB — APTT: aPTT: 30 seconds (ref 24–37)

## 2014-05-21 LAB — PROTIME-INR
INR: 1.13 (ref 0.00–1.49)
PROTHROMBIN TIME: 14.6 s (ref 11.6–15.2)

## 2014-05-21 NOTE — Progress Notes (Signed)
BMP results faxed via EPIC to Dr Alvan Dame done 05/21/2014.

## 2014-05-21 NOTE — Progress Notes (Signed)
cxr- 01/2014-epic

## 2014-05-25 ENCOUNTER — Encounter (HOSPITAL_COMMUNITY): Payer: Medicare Other | Admitting: Anesthesiology

## 2014-05-25 ENCOUNTER — Inpatient Hospital Stay (HOSPITAL_COMMUNITY)
Admission: RE | Admit: 2014-05-25 | Discharge: 2014-05-26 | DRG: 470 | Disposition: A | Discharge status: Home Health | Payer: Medicare Other | Source: Ambulatory Visit | Attending: Orthopedic Surgery | Admitting: Orthopedic Surgery

## 2014-05-25 ENCOUNTER — Inpatient Hospital Stay (HOSPITAL_COMMUNITY): Payer: Medicare Other | Admitting: Anesthesiology

## 2014-05-25 ENCOUNTER — Encounter (HOSPITAL_COMMUNITY): Payer: Self-pay | Admitting: *Deleted

## 2014-05-25 ENCOUNTER — Encounter (HOSPITAL_COMMUNITY)
Admission: RE | Disposition: A | Discharge status: Home Health | Payer: Self-pay | Source: Ambulatory Visit | Attending: Orthopedic Surgery

## 2014-05-25 DIAGNOSIS — K219 Gastro-esophageal reflux disease without esophagitis: Secondary | ICD-10-CM | POA: Diagnosis present

## 2014-05-25 DIAGNOSIS — I10 Essential (primary) hypertension: Secondary | ICD-10-CM | POA: Diagnosis present

## 2014-05-25 DIAGNOSIS — M659 Synovitis and tenosynovitis, unspecified: Secondary | ICD-10-CM | POA: Diagnosis present

## 2014-05-25 DIAGNOSIS — M25561 Pain in right knee: Secondary | ICD-10-CM | POA: Diagnosis present

## 2014-05-25 DIAGNOSIS — Z6826 Body mass index (BMI) 26.0-26.9, adult: Secondary | ICD-10-CM | POA: Diagnosis not present

## 2014-05-25 DIAGNOSIS — Z85820 Personal history of malignant melanoma of skin: Secondary | ICD-10-CM

## 2014-05-25 DIAGNOSIS — M179 Osteoarthritis of knee, unspecified: Principal | ICD-10-CM | POA: Diagnosis present

## 2014-05-25 DIAGNOSIS — E119 Type 2 diabetes mellitus without complications: Secondary | ICD-10-CM | POA: Diagnosis present

## 2014-05-25 DIAGNOSIS — Z96652 Presence of left artificial knee joint: Secondary | ICD-10-CM | POA: Diagnosis present

## 2014-05-25 DIAGNOSIS — Z96651 Presence of right artificial knee joint: Secondary | ICD-10-CM

## 2014-05-25 DIAGNOSIS — M1710 Unilateral primary osteoarthritis, unspecified knee: Secondary | ICD-10-CM | POA: Diagnosis present

## 2014-05-25 DIAGNOSIS — H9190 Unspecified hearing loss, unspecified ear: Secondary | ICD-10-CM | POA: Diagnosis present

## 2014-05-25 HISTORY — PX: TOTAL KNEE ARTHROPLASTY: SHX125

## 2014-05-25 LAB — TYPE AND SCREEN
ABO/RH(D): A NEG
Antibody Screen: NEGATIVE

## 2014-05-25 LAB — GLUCOSE, CAPILLARY
Glucose-Capillary: 106 mg/dL — ABNORMAL HIGH (ref 70–99)
Glucose-Capillary: 127 mg/dL — ABNORMAL HIGH (ref 70–99)
Glucose-Capillary: 327 mg/dL — ABNORMAL HIGH (ref 70–99)
Glucose-Capillary: 364 mg/dL — ABNORMAL HIGH (ref 70–99)

## 2014-05-25 SURGERY — ARTHROPLASTY, KNEE, TOTAL
Anesthesia: Spinal | Site: Knee | Laterality: Right

## 2014-05-25 MED ORDER — PHENYLEPHRINE HCL 10 MG/ML IJ SOLN
INTRAMUSCULAR | Status: DC | PRN
  Administered 2014-05-25 (×4): 40 ug via INTRAVENOUS
  Administered 2014-05-25 (×3): 80 ug via INTRAVENOUS
  Administered 2014-05-25: 40 ug via INTRAVENOUS

## 2014-05-25 MED ORDER — EPHEDRINE SULFATE 50 MG/ML IJ SOLN
INTRAMUSCULAR | Status: DC | PRN
  Administered 2014-05-25: 5 mg via INTRAVENOUS
  Administered 2014-05-25: 10 mg via INTRAVENOUS
  Administered 2014-05-25: 5 mg via INTRAVENOUS
  Administered 2014-05-25: 10 mg via INTRAVENOUS
  Administered 2014-05-25 (×2): 5 mg via INTRAVENOUS

## 2014-05-25 MED ORDER — BUPIVACAINE-EPINEPHRINE (PF) 0.25% -1:200000 IJ SOLN
INTRAMUSCULAR | Status: AC
  Filled 2014-05-25: qty 30

## 2014-05-25 MED ORDER — PROMETHAZINE HCL 25 MG/ML IJ SOLN: 6.2500 mg | INTRAMUSCULAR | Status: DC | PRN

## 2014-05-25 MED ORDER — MENTHOL 3 MG MT LOZG: 1.0000 | LOZENGE | OROMUCOSAL | Status: DC | PRN

## 2014-05-25 MED ORDER — DEXAMETHASONE SODIUM PHOSPHATE 10 MG/ML IJ SOLN
INTRAMUSCULAR | Status: AC
  Filled 2014-05-25: qty 1

## 2014-05-25 MED ORDER — BUPIVACAINE IN DEXTROSE 0.75-8.25 % IT SOLN
INTRATHECAL | Status: DC | PRN
  Administered 2014-05-25: 1.6 mL via INTRATHECAL

## 2014-05-25 MED ORDER — PROPOFOL 10 MG/ML IV BOLUS
INTRAVENOUS | Status: AC
  Filled 2014-05-25: qty 20

## 2014-05-25 MED ORDER — TERAZOSIN HCL 5 MG PO CAPS
5.0000 mg | ORAL_CAPSULE | Freq: Every day | ORAL | Status: DC
  Administered 2014-05-25: 5 mg via ORAL
  Filled 2014-05-25 (×2): qty 1

## 2014-05-25 MED ORDER — BISACODYL 10 MG RE SUPP: 10.0000 mg | Freq: Every day | RECTAL | Status: DC | PRN

## 2014-05-25 MED ORDER — MEPERIDINE HCL 50 MG/ML IJ SOLN: 6.2500 mg | INTRAMUSCULAR | Status: DC | PRN

## 2014-05-25 MED ORDER — METOCLOPRAMIDE HCL 5 MG/ML IJ SOLN
INTRAMUSCULAR | Status: DC | PRN
  Administered 2014-05-25: 10 mg via INTRAVENOUS

## 2014-05-25 MED ORDER — OXYCODONE HCL 5 MG PO TABS
5.0000 mg | ORAL_TABLET | ORAL | Status: DC
  Administered 2014-05-25 (×2): 15 mg via ORAL
  Administered 2014-05-25: 10 mg via ORAL
  Administered 2014-05-26: 15 mg via ORAL
  Administered 2014-05-26: 10 mg via ORAL
  Administered 2014-05-26 (×2): 15 mg via ORAL
  Filled 2014-05-25: qty 3
  Filled 2014-05-25: qty 2
  Filled 2014-05-25 (×2): qty 3
  Filled 2014-05-25: qty 2
  Filled 2014-05-25 (×3): qty 3

## 2014-05-25 MED ORDER — ASPIRIN EC 325 MG PO TBEC
325.0000 mg | DELAYED_RELEASE_TABLET | Freq: Two times a day (BID) | ORAL | Status: DC
  Administered 2014-05-26: 325 mg via ORAL
  Filled 2014-05-25 (×2): qty 1

## 2014-05-25 MED ORDER — LIDOCAINE HCL (CARDIAC) 20 MG/ML IV SOLN
INTRAVENOUS | Status: DC | PRN
Start: 2014-05-25 — End: 2014-05-25
  Administered 2014-05-25: 100 mg via INTRAVENOUS

## 2014-05-25 MED ORDER — METOCLOPRAMIDE HCL 5 MG/ML IJ SOLN
INTRAMUSCULAR | Status: AC
Start: 2014-05-25 — End: 2014-05-25
  Filled 2014-05-25: qty 2

## 2014-05-25 MED ORDER — CEFAZOLIN SODIUM-DEXTROSE 2-3 GM-% IV SOLR
INTRAVENOUS | Status: AC
  Filled 2014-05-25: qty 50

## 2014-05-25 MED ORDER — MAGNESIUM CITRATE PO SOLN: 1.0000 | Freq: Once | ORAL | Status: AC | PRN

## 2014-05-25 MED ORDER — ONDANSETRON HCL 4 MG/2ML IJ SOLN
INTRAMUSCULAR | Status: AC
  Filled 2014-05-25: qty 2

## 2014-05-25 MED ORDER — GLYCOPYRROLATE 0.2 MG/ML IJ SOLN
INTRAMUSCULAR | Status: AC
  Filled 2014-05-25: qty 1

## 2014-05-25 MED ORDER — CEFAZOLIN SODIUM-DEXTROSE 2-3 GM-% IV SOLR
2.0000 g | Freq: Four times a day (QID) | INTRAVENOUS | Status: AC
  Administered 2014-05-25 (×2): 2 g via INTRAVENOUS
  Filled 2014-05-25 (×2): qty 50

## 2014-05-25 MED ORDER — EPHEDRINE SULFATE 50 MG/ML IJ SOLN
INTRAMUSCULAR | Status: AC
  Filled 2014-05-25: qty 1

## 2014-05-25 MED ORDER — KETOROLAC TROMETHAMINE 30 MG/ML IJ SOLN
INTRAMUSCULAR | Status: AC
  Filled 2014-05-25: qty 1

## 2014-05-25 MED ORDER — FERROUS SULFATE 325 (65 FE) MG PO TABS
325.0000 mg | ORAL_TABLET | Freq: Three times a day (TID) | ORAL | Status: DC
  Administered 2014-05-25 – 2014-05-26 (×3): 325 mg via ORAL
  Filled 2014-05-25 (×5): qty 1

## 2014-05-25 MED ORDER — METHOCARBAMOL 500 MG PO TABS: 500.0000 mg | ORAL_TABLET | Freq: Four times a day (QID) | ORAL | Status: DC | PRN

## 2014-05-25 MED ORDER — DOCUSATE SODIUM 100 MG PO CAPS
100.0000 mg | ORAL_CAPSULE | Freq: Two times a day (BID) | ORAL | Status: DC
  Administered 2014-05-25 – 2014-05-26 (×2): 100 mg via ORAL

## 2014-05-25 MED ORDER — SIMVASTATIN 40 MG PO TABS
40.0000 mg | ORAL_TABLET | Freq: Every evening | ORAL | Status: DC
  Administered 2014-05-25: 40 mg via ORAL
  Filled 2014-05-25 (×2): qty 1

## 2014-05-25 MED ORDER — PHENOL 1.4 % MT LIQD: 1.0000 | OROMUCOSAL | Status: DC | PRN

## 2014-05-25 MED ORDER — TRANEXAMIC ACID 100 MG/ML IV SOLN
1000.0000 mg | Freq: Once | INTRAVENOUS | Status: AC
  Administered 2014-05-25: 1000 mg via INTRAVENOUS
  Filled 2014-05-25: qty 10

## 2014-05-25 MED ORDER — DEXAMETHASONE SODIUM PHOSPHATE 10 MG/ML IJ SOLN
INTRAMUSCULAR | Status: DC | PRN
  Administered 2014-05-25: 10 mg via INTRAVENOUS

## 2014-05-25 MED ORDER — ONDANSETRON HCL 4 MG PO TABS: 4.0000 mg | ORAL_TABLET | Freq: Four times a day (QID) | ORAL | Status: DC | PRN

## 2014-05-25 MED ORDER — SODIUM CHLORIDE 0.9 % IV SOLN
INTRAVENOUS | Status: DC
  Filled 2014-05-25 (×3): qty 1000

## 2014-05-25 MED ORDER — GLYCOPYRROLATE 0.2 MG/ML IJ SOLN
INTRAMUSCULAR | Status: DC | PRN
  Administered 2014-05-25 (×2): 0.1 mg via INTRAVENOUS

## 2014-05-25 MED ORDER — FENTANYL CITRATE 0.05 MG/ML IJ SOLN
INTRAMUSCULAR | Status: DC | PRN
  Administered 2014-05-25: 25 ug via INTRAVENOUS
  Administered 2014-05-25: 50 ug via INTRAVENOUS

## 2014-05-25 MED ORDER — KETOROLAC TROMETHAMINE 30 MG/ML IJ SOLN
INTRAMUSCULAR | Status: DC | PRN
  Administered 2014-05-25: 30 mg

## 2014-05-25 MED ORDER — METOCLOPRAMIDE HCL 10 MG PO TABS: 5.0000 mg | ORAL_TABLET | Freq: Three times a day (TID) | ORAL | Status: DC | PRN

## 2014-05-25 MED ORDER — SODIUM CHLORIDE 0.9 % IJ SOLN
INTRAMUSCULAR | Status: AC
  Filled 2014-05-25: qty 50

## 2014-05-25 MED ORDER — SODIUM CHLORIDE 0.9 % IJ SOLN
INTRAMUSCULAR | Status: DC | PRN
  Administered 2014-05-25: 30 mL

## 2014-05-25 MED ORDER — POTASSIUM CHLORIDE IN NACL 20-0.9 MEQ/L-% IV SOLN
INTRAVENOUS | Status: DC
  Administered 2014-05-25: 13:00:00 via INTRAVENOUS
  Filled 2014-05-25 (×4): qty 1000

## 2014-05-25 MED ORDER — DEXAMETHASONE SODIUM PHOSPHATE 10 MG/ML IJ SOLN
10.0000 mg | Freq: Once | INTRAMUSCULAR | Status: DC
Start: 2014-05-25 — End: 2014-05-25

## 2014-05-25 MED ORDER — ONDANSETRON HCL 4 MG/2ML IJ SOLN
INTRAMUSCULAR | Status: DC | PRN
  Administered 2014-05-25: 4 mg via INTRAVENOUS

## 2014-05-25 MED ORDER — DIPHENHYDRAMINE HCL 25 MG PO CAPS: 25.0000 mg | ORAL_CAPSULE | Freq: Four times a day (QID) | ORAL | Status: DC | PRN

## 2014-05-25 MED ORDER — PHENYLEPHRINE 40 MCG/ML (10ML) SYRINGE FOR IV PUSH (FOR BLOOD PRESSURE SUPPORT)
PREFILLED_SYRINGE | INTRAVENOUS | Status: AC
  Filled 2014-05-25: qty 10

## 2014-05-25 MED ORDER — ONDANSETRON HCL 4 MG/2ML IJ SOLN
4.0000 mg | Freq: Four times a day (QID) | INTRAMUSCULAR | Status: DC | PRN
  Administered 2014-05-26: 4 mg via INTRAVENOUS
  Filled 2014-05-25: qty 2

## 2014-05-25 MED ORDER — DEXAMETHASONE SODIUM PHOSPHATE 10 MG/ML IJ SOLN
10.0000 mg | Freq: Once | INTRAMUSCULAR | Status: AC
  Administered 2014-05-26: 10 mg via INTRAVENOUS
  Filled 2014-05-25: qty 1

## 2014-05-25 MED ORDER — HYDROCHLOROTHIAZIDE 25 MG PO TABS
25.0000 mg | ORAL_TABLET | Freq: Every evening | ORAL | Status: DC
  Administered 2014-05-25: 25 mg via ORAL
  Filled 2014-05-25 (×2): qty 1

## 2014-05-25 MED ORDER — INSULIN ASPART 100 UNIT/ML ~~LOC~~ SOLN
4.0000 [IU] | Freq: Three times a day (TID) | SUBCUTANEOUS | Status: DC
  Administered 2014-05-25 – 2014-05-26 (×2): 4 [IU] via SUBCUTANEOUS

## 2014-05-25 MED ORDER — INSULIN ASPART 100 UNIT/ML ~~LOC~~ SOLN
0.0000 [IU] | Freq: Three times a day (TID) | SUBCUTANEOUS | Status: DC
  Administered 2014-05-25: 11 [IU] via SUBCUTANEOUS
  Administered 2014-05-26: 15 [IU] via SUBCUTANEOUS

## 2014-05-25 MED ORDER — HYDROMORPHONE HCL 1 MG/ML IJ SOLN: 0.5000 mg | INTRAMUSCULAR | Status: DC | PRN

## 2014-05-25 MED ORDER — BUPIVACAINE-EPINEPHRINE (PF) 0.25% -1:200000 IJ SOLN
INTRAMUSCULAR | Status: DC | PRN
  Administered 2014-05-25: 30 mL

## 2014-05-25 MED ORDER — POLYETHYLENE GLYCOL 3350 17 G PO PACK
17.0000 g | PACK | Freq: Every day | ORAL | Status: DC | PRN
  Administered 2014-05-25: 17 g via ORAL

## 2014-05-25 MED ORDER — METOCLOPRAMIDE HCL 5 MG/ML IJ SOLN
5.0000 mg | Freq: Three times a day (TID) | INTRAMUSCULAR | Status: DC | PRN
Start: 2014-05-25 — End: 2014-05-26

## 2014-05-25 MED ORDER — CELECOXIB 200 MG PO CAPS
200.0000 mg | ORAL_CAPSULE | Freq: Two times a day (BID) | ORAL | Status: DC
  Administered 2014-05-25 – 2014-05-26 (×2): 200 mg via ORAL
  Filled 2014-05-25 (×3): qty 1

## 2014-05-25 MED ORDER — ALUM & MAG HYDROXIDE-SIMETH 200-200-20 MG/5ML PO SUSP: 30.0000 mL | ORAL | Status: DC | PRN

## 2014-05-25 MED ORDER — 0.9 % SODIUM CHLORIDE (POUR BTL) OPTIME
TOPICAL | Status: DC | PRN
  Administered 2014-05-25: 1000 mL

## 2014-05-25 MED ORDER — SODIUM CHLORIDE 0.9 % IR SOLN
Status: DC | PRN
  Administered 2014-05-25: 1000 mL

## 2014-05-25 MED ORDER — HYDROMORPHONE HCL 1 MG/ML IJ SOLN: 0.2500 mg | INTRAMUSCULAR | Status: DC | PRN

## 2014-05-25 MED ORDER — FENTANYL CITRATE 0.05 MG/ML IJ SOLN
INTRAMUSCULAR | Status: AC
  Filled 2014-05-25: qty 2

## 2014-05-25 MED ORDER — CHLORHEXIDINE GLUCONATE 4 % EX LIQD: 60.0000 mL | Freq: Once | CUTANEOUS | Status: DC

## 2014-05-25 MED ORDER — METHOCARBAMOL 1000 MG/10ML IJ SOLN
500.0000 mg | Freq: Four times a day (QID) | INTRAVENOUS | Status: DC | PRN
  Administered 2014-05-25: 500 mg via INTRAVENOUS
  Filled 2014-05-25 (×2): qty 5

## 2014-05-25 MED ORDER — CEFAZOLIN SODIUM-DEXTROSE 2-3 GM-% IV SOLR
2.0000 g | INTRAVENOUS | Status: AC
  Administered 2014-05-25: 2 g via INTRAVENOUS

## 2014-05-25 MED ORDER — ACETAMINOPHEN 500 MG PO TABS
1000.0000 mg | ORAL_TABLET | Freq: Four times a day (QID) | ORAL | Status: AC
  Administered 2014-05-25 – 2014-05-26 (×4): 1000 mg via ORAL
  Filled 2014-05-25 (×4): qty 2

## 2014-05-25 MED ORDER — PROPOFOL INFUSION 10 MG/ML OPTIME
INTRAVENOUS | Status: DC | PRN
  Administered 2014-05-25: 75 ug/kg/min via INTRAVENOUS

## 2014-05-25 MED ORDER — GLYBURIDE 5 MG PO TABS
10.0000 mg | ORAL_TABLET | Freq: Two times a day (BID) | ORAL | Status: DC
  Administered 2014-05-26: 10 mg via ORAL
  Filled 2014-05-25 (×3): qty 2

## 2014-05-25 MED ORDER — INSULIN GLARGINE 100 UNIT/ML ~~LOC~~ SOLN
25.0000 [IU] | Freq: Every morning | SUBCUTANEOUS | Status: DC
  Administered 2014-05-25 – 2014-05-26 (×2): 25 [IU] via SUBCUTANEOUS
  Filled 2014-05-25 (×2): qty 0.25

## 2014-05-25 MED ORDER — LACTATED RINGERS IV SOLN
INTRAVENOUS | Status: DC
  Administered 2014-05-25: 1000 mL via INTRAVENOUS
  Administered 2014-05-25: 09:00:00 via INTRAVENOUS

## 2014-05-25 SURGICAL SUPPLY — 63 items
ADH SKN CLS APL DERMABOND .7 (GAUZE/BANDAGES/DRESSINGS) ×1
BAG SPEC THK2 15X12 ZIP CLS (MISCELLANEOUS) ×1
BAG ZIPLOCK 12X15 (MISCELLANEOUS) ×3 IMPLANT
BANDAGE ELASTIC 6 VELCRO ST LF (GAUZE/BANDAGES/DRESSINGS) ×3 IMPLANT
BANDAGE ESMARK 6X9 LF (GAUZE/BANDAGES/DRESSINGS) ×1 IMPLANT
BLADE SAW SGTL 13.0X1.19X90.0M (BLADE) ×3 IMPLANT
BNDG CMPR 9X6 STRL LF SNTH (GAUZE/BANDAGES/DRESSINGS) ×1
BNDG ESMARK 6X9 LF (GAUZE/BANDAGES/DRESSINGS) ×3
BONE CEMENT GENTAMICIN (Cement) ×6 IMPLANT
BOWL SMART MIX CTS (DISPOSABLE) ×3 IMPLANT
CAPT RP KNEE ×3 IMPLANT
CEMENT BONE GENTAMICIN 40 (Cement) ×2 IMPLANT
CUFF TOURN SGL QUICK 34 (TOURNIQUET CUFF) ×3
CUFF TRNQT CYL 34X4X40X1 (TOURNIQUET CUFF) ×1 IMPLANT
DECANTER SPIKE VIAL GLASS SM (MISCELLANEOUS) IMPLANT
DERMABOND ADVANCED (GAUZE/BANDAGES/DRESSINGS) ×2
DERMABOND ADVANCED .7 DNX12 (GAUZE/BANDAGES/DRESSINGS) ×1 IMPLANT
DRAPE EXTREMITY TIBURON (DRAPES) ×3 IMPLANT
DRAPE POUCH INSTRU U-SHP 10X18 (DRAPES) ×3 IMPLANT
DRAPE U-SHAPE 47X51 STRL (DRAPES) ×3 IMPLANT
DRSG AQUACEL AG ADV 3.5X10 (GAUZE/BANDAGES/DRESSINGS) ×3 IMPLANT
DURAPREP 26ML APPLICATOR (WOUND CARE) ×6 IMPLANT
ELECT REM PT RETURN 9FT ADLT (ELECTROSURGICAL) ×3
ELECTRODE REM PT RTRN 9FT ADLT (ELECTROSURGICAL) ×1 IMPLANT
FACESHIELD WRAPAROUND (MASK) ×12 IMPLANT
FACESHIELD WRAPAROUND OR TEAM (MASK) ×5 IMPLANT
GLOVE BIO SURGEON STRL SZ7.5 (GLOVE) ×2 IMPLANT
GLOVE BIO SURGEON STRL SZ8 (GLOVE) ×6 IMPLANT
GLOVE BIOGEL M 7.0 STRL (GLOVE) ×6 IMPLANT
GLOVE BIOGEL PI IND STRL 7.5 (GLOVE) ×1 IMPLANT
GLOVE BIOGEL PI IND STRL 8 (GLOVE) ×2 IMPLANT
GLOVE BIOGEL PI IND STRL 8.5 (GLOVE) IMPLANT
GLOVE BIOGEL PI INDICATOR 7.5 (GLOVE) ×6
GLOVE BIOGEL PI INDICATOR 8 (GLOVE) ×4
GLOVE BIOGEL PI INDICATOR 8.5 (GLOVE)
GLOVE ECLIPSE 8.0 STRL XLNG CF (GLOVE) ×3 IMPLANT
GLOVE ORTHO TXT STRL SZ7.5 (GLOVE) ×6 IMPLANT
GOWN SPEC L3 XXLG W/TWL (GOWN DISPOSABLE) ×3 IMPLANT
GOWN STRL REUS W/ TWL XL LVL3 (GOWN DISPOSABLE) ×2 IMPLANT
GOWN STRL REUS W/TWL LRG LVL3 (GOWN DISPOSABLE) ×3 IMPLANT
GOWN STRL REUS W/TWL XL LVL3 (GOWN DISPOSABLE) ×6
HANDPIECE INTERPULSE COAX TIP (DISPOSABLE) ×3
KIT BASIN OR (CUSTOM PROCEDURE TRAY) ×3 IMPLANT
MANIFOLD NEPTUNE II (INSTRUMENTS) ×3 IMPLANT
NDL SAFETY ECLIPSE 18X1.5 (NEEDLE) ×1 IMPLANT
NEEDLE HYPO 18GX1.5 SHARP (NEEDLE) ×3
PACK TOTAL JOINT (CUSTOM PROCEDURE TRAY) ×3 IMPLANT
POSITIONER SURGICAL ARM (MISCELLANEOUS) ×3 IMPLANT
SET HNDPC FAN SPRY TIP SCT (DISPOSABLE) ×1 IMPLANT
SET PAD KNEE POSITIONER (MISCELLANEOUS) ×3 IMPLANT
SUCTION FRAZIER 12FR DISP (SUCTIONS) ×3 IMPLANT
SUT MNCRL AB 4-0 PS2 18 (SUTURE) ×3 IMPLANT
SUT VIC AB 1 CT1 36 (SUTURE) ×3 IMPLANT
SUT VIC AB 2-0 CT1 27 (SUTURE) ×9
SUT VIC AB 2-0 CT1 TAPERPNT 27 (SUTURE) ×3 IMPLANT
SUT VLOC 180 0 24IN GS25 (SUTURE) ×3 IMPLANT
SYR 50ML LL SCALE MARK (SYRINGE) ×3 IMPLANT
TOWEL OR 17X26 10 PK STRL BLUE (TOWEL DISPOSABLE) ×3 IMPLANT
TOWEL OR NON WOVEN STRL DISP B (DISPOSABLE) ×3 IMPLANT
TRAY FOLEY CATH 14FRSI W/METER (CATHETERS) IMPLANT
TRAY FOLEY CATH 16FR SILVER (SET/KITS/TRAYS/PACK) ×3 IMPLANT
WATER STERILE IRR 1500ML POUR (IV SOLUTION) ×3 IMPLANT
WRAP KNEE MAXI GEL POST OP (GAUZE/BANDAGES/DRESSINGS) ×3 IMPLANT

## 2014-05-25 NOTE — Anesthesia Preprocedure Evaluation (Signed)
Anesthesia Evaluation  Patient identified by MRN, date of birth, ID band Patient awake    Reviewed: Allergy & Precautions, H&P , NPO status , Patient's Chart, lab work & pertinent test results  Airway        Dental   Pulmonary neg pulmonary ROS,          Cardiovascular hypertension, Pt. on medications     Neuro/Psych negative neurological ROS  negative psych ROS   GI/Hepatic negative GI ROS, Neg liver ROS,   Endo/Other  negative endocrine ROSdiabetes, Type 2, Insulin Dependent  Renal/GU negative Renal ROS     Musculoskeletal  (+) Arthritis -, Osteoarthritis,    Abdominal   Peds  Hematology  (+) anemia ,   Anesthesia Other Findings   Reproductive/Obstetrics negative OB ROS                             Anesthesia Physical Anesthesia Plan  ASA: III  Anesthesia Plan: Spinal   Post-op Pain Management:    Induction:   Airway Management Planned:   Additional Equipment:   Intra-op Plan:   Post-operative Plan:   Informed Consent: I have reviewed the patients History and Physical, chart, labs and discussed the procedure including the risks, benefits and alternatives for the proposed anesthesia with the patient or authorized representative who has indicated his/her understanding and acceptance.   Dental advisory given  Plan Discussed with: CRNA  Anesthesia Plan Comments:         Anesthesia Quick Evaluation                                  Anesthesia Evaluation  Patient identified by MRN, date of birth, ID band Patient awake  General Assessment Comment: Wears dentures     .  Wears glasses         reading   .  Hypertension     .  GERD (gastroesophageal reflux disease)     .  BPH (benign prostatic hypertrophy)     .  Cataract         02-12-14 left- ready for surgery, but only has had right done   .  Impaired hearing         hard of hearing -no hearing aids   .  Diabetes  mellitus without complication         diabetes x4 yrs-oral med and Lantus used   .  Arthritis         osteoarthritis-knees and feet   .  Cancer         melanoma(back)-no futher issues. Skin cancers face "frozen"     Reviewed: Allergy & Precautions, H&P , NPO status , Patient's Chart, lab work & pertinent test results  Airway Mallampati: II TM Distance: >3 FB Neck ROM: Full    Dental no notable dental hx.    Pulmonary neg pulmonary ROS,  breath sounds clear to auscultation  Pulmonary exam normal       Cardiovascular Exercise Tolerance: Good hypertension, Pt. on medications Rhythm:Regular Rate:Normal     Neuro/Psych negative neurological ROS  negative psych ROS   GI/Hepatic Neg liver ROS, GERD-  ,  Endo/Other  diabetes, Type 2, Oral Hypoglycemic Agents, Insulin Dependent  Renal/GU negative Renal ROS  negative genitourinary   Musculoskeletal negative musculoskeletal ROS (+)   Abdominal   Peds negative pediatric ROS (+)  Hematology negative  hematology ROS (+)   Anesthesia Other Findings   Reproductive/Obstetrics negative OB ROS                         Anesthesia Physical Anesthesia Plan  ASA: III  Anesthesia Plan: Spinal   Post-op Pain Management:    Induction: Intravenous  Airway Management Planned:   Additional Equipment:   Intra-op Plan:   Post-operative Plan: Extubation in OR  Informed Consent: I have reviewed the patients History and Physical, chart, labs and discussed the procedure including the risks, benefits and alternatives for the proposed anesthesia with the patient or authorized representative who has indicated his/her understanding and acceptance.   Dental advisory given  Plan Discussed with: CRNA  Anesthesia Plan Comments: (Discussed general and spinal. Discussed risks/benefits of spinal including headache, backache, failure, bleeding, infection, and nerve damage. Patient consents to spinal.  Questions answered. Coagulation studies and platelet count acceptable.)      Anesthesia Quick Evaluation

## 2014-05-25 NOTE — Anesthesia Procedure Notes (Signed)
Spinal  Patient location during procedure: OR Staffing Anesthesiologist: Nolon Nations R Performed by: anesthesiologist  Preanesthetic Checklist Completed: patient identified, site marked, surgical consent, pre-op evaluation, timeout performed, IV checked, risks and benefits discussed and monitors and equipment checked Spinal Block Patient position: sitting Prep: Betadine Patient monitoring: heart rate, continuous pulse ox and blood pressure Approach: right paramedian Location: L3-4 Injection technique: single-shot Needle Needle type: Spinocan  Needle gauge: 22 G Needle length: 9 cm Assessment Sensory level: T8 Additional Notes Expiration date of kit checked and confirmed. Patient tolerated procedure well, without complications.

## 2014-05-25 NOTE — Evaluation (Signed)
Physical Therapy Evaluation Patient Details Name: Caleb Dougherty MRN: [id number] DOB: [birth year] Today's Date: 05/25/2014   History of Present Illness  Pt is an 80 year old male s/p R TKA with hx of L TKA 3 months ago.  Clinical Impression  Pt is s/p R TKA resulting in the deficits listed below (see PT Problem List).  Pt will benefit from skilled PT to increase their independence and safety with mobility to allow discharge to the venue listed below.  Pt able to ambulate short distance POD #0 and plans to d/c home with spouse.     Follow Up Recommendations Home health PT    Equipment Recommendations  None recommended by PT    Recommendations for Other Services       Precautions / Restrictions Precautions Precautions: Knee Restrictions Other Position/Activity Restrictions: WBAT      Mobility  Bed Mobility Overal bed mobility: Needs Assistance Bed Mobility: Supine to Sit     Supine to sit: Min assist     General bed mobility comments: assist for R LE  Transfers Overall transfer level: Needs assistance Equipment used: Rolling walker (2 wheeled) Transfers: Sit to/from Stand Sit to Stand: Min assist         General transfer comment: verbal cues for UE and LE placement  Ambulation/Gait Ambulation/Gait assistance: Min guard Ambulation Distance (Feet): 80 Feet Assistive device: Rolling walker (2 wheeled) Gait Pattern/deviations: Step-to pattern;Antalgic     General Gait Details: verbal cues for sequence, RW distance, step length  Stairs            Wheelchair Mobility    Modified Rankin (Stroke Patients Only)       Balance                                             Pertinent Vitals/Pain Pain Assessment: 0-10 Pain Score: 3  Pain Location: R knee Pain Descriptors / Indicators: Aching;Sore Pain Intervention(s): Limited activity within patient's tolerance;Monitored during session;Premedicated before session;Repositioned;Ice  applied    Home Living Family/patient expects to be discharged to:: Private residence Living Arrangements: Spouse/significant other Available Help at Discharge: Family Type of Home: House Home Access: Ramped entrance;Stairs to enter Entrance Stairs-Rails: None Entrance Stairs-Number of Steps: 2 Home Layout: One level Home Equipment: Cane - single point;Walker - 2 wheels      Prior Function Level of Independence: Independent               Hand Dominance        Extremity/Trunk Assessment               Lower Extremity Assessment: RLE deficits/detail RLE Deficits / Details: able to perform SLR, at least 80* functional ROM observed with sitting       Communication   Communication: No difficulties  Cognition Arousal/Alertness: Awake/alert Behavior During Therapy: WFL for tasks assessed/performed Overall Cognitive Status: Within Functional Limits for tasks assessed                      General Comments      Exercises        Assessment/Plan    PT Assessment Patient needs continued PT services  PT Diagnosis Difficulty walking   PT Problem List Decreased strength;Decreased range of motion;Decreased mobility;Pain  PT Treatment Interventions Functional mobility training;Gait training;DME instruction;Patient/family education;Therapeutic activities;Therapeutic exercise;Stair training   PT Goals (  Current goals can be found in the Care Plan section) Acute Rehab PT Goals PT Goal Formulation: With patient Time For Goal Achievement: 05/29/14 Potential to Achieve Goals: Good    Frequency 7X/week   Barriers to discharge        Co-evaluation               End of Session   Activity Tolerance: Patient tolerated treatment well Patient left: with call bell/phone within reach;in chair           Time: 1660-6004 PT Time Calculation (min): 12 min   Charges:   PT Evaluation $Initial PT Evaluation Tier I: 1 Procedure PT Treatments $Gait  Training: 8-22 mins   PT G Codes:          Caleb Dougherty,Caleb Dougherty 05/25/2014, 5:42 PM Carmelia Bake, PT, DPT 05/25/2014 Pager: [phone]

## 2014-05-25 NOTE — Interval H&P Note (Signed)
History and Physical Interval Note:  05/25/2014 7:00 AM  Caleb Dougherty  has presented today for surgery, with the diagnosis of RIGHT KNEE OA  The various methods of treatment have been discussed with the patient and family. After consideration of risks, benefits and other options for treatment, the patient has consented to  Procedure(s): RIGHT TOTAL KNEE ARTHROPLASTY (Right) as a surgical intervention .  The patient's history has been reviewed, patient examined, no change in status, stable for surgery.  I have reviewed the patient's chart and labs.  Questions were answered to the patient's satisfaction.     Mauri Pole

## 2014-05-25 NOTE — Transfer of Care (Signed)
Immediate Anesthesia Transfer of Care Note  Patient: Caleb Dougherty  Procedure(s) Performed: Procedure(s): RIGHT TOTAL KNEE ARTHROPLASTY (Right)  Patient Location: PACU  Anesthesia Type:MAC and Spinal  Level of Consciousness: Patient easily awoken, sedated, comfortable, cooperative, following commands, responds to stimulation.   Airway & Oxygen Therapy: Patient spontaneously breathing, ventilating well, oxygen via simple oxygen mask.  Post-op Assessment: Report given to PACU RN, vital signs reviewed and stable.   Post vital signs: Reviewed and stable.  Complications: No apparent anesthesia complications

## 2014-05-25 NOTE — Anesthesia Postprocedure Evaluation (Signed)
Anesthesia Post Note  Patient: Caleb Dougherty  Procedure(s) Performed: Procedure(s) (LRB): RIGHT TOTAL KNEE ARTHROPLASTY (Right)  Anesthesia type: Spinal  Patient location: PACU  Post pain: Pain level controlled  Post assessment: Post-op Vital signs reviewed  Last Vitals: BP 135/62  Pulse 60  Temp(Src) 36.8 C (Oral)  Resp 16  Ht 5' 7.5" (1.715 m)  Wt 169 lb (76.658 kg)  BMI 26.06 kg/m2  SpO2 100%  Post vital signs: Reviewed  Level of consciousness: sedated  Complications: No apparent anesthesia complications

## 2014-05-25 NOTE — Op Note (Signed)
NAME:  Caleb Dougherty                      MEDICAL RECORD NO.:  [id number]                             FACILITY:  [facility]      PHYSICIAN:  Pietro Cassis. Alvan Dame, M.D.  DATE OF BIRTH:  12/08/1928      DATE OF PROCEDURE:  05/25/2014                                     OPERATIVE REPORT         PREOPERATIVE DIAGNOSIS:  Right knee osteoarthritis.      POSTOPERATIVE DIAGNOSIS:  Right knee osteoarthritis.      FINDINGS:  The patient was noted to have complete loss of cartilage and   bone-on-bone arthritis with associated osteophytes in the medial and patellofemoral compartments of   the knee with a significant synovitis and associated effusion.      PROCEDURE:  Right total knee replacement.      COMPONENTS USED:  DePuy rotating platform posterior stabilized knee   system, a size 4 femur, 4 tibia, 12.5 mm PS insert, and 41 patellar   button.      SURGEON:  Pietro Cassis. Alvan Dame, M.D.      ASSISTANT:  Nehemiah Massed, PA-C.      ANESTHESIA:  Spinal.      SPECIMENS:  None.      COMPLICATION:  None.      DRAINS:  None.  EBL: <100cc      TOURNIQUET TIME:   Total Tourniquet Time Documented: Thigh (Right) - 34 minutes Total: Thigh (Right) - 34 minutes  .      The patient was stable to the recovery room.      INDICATION FOR PROCEDURE:  Caleb Dougherty is a 80 y.o. male patient of   mine.  The patient had been seen, evaluated, and treated conservatively in the   office with medication, activity modification, and injections.  The patient had   radiographic changes of bone-on-bone arthritis with endplate sclerosis and osteophytes noted.      The patient failed conservative measures including medication, injections, and activity modification, and at this point was ready for more definitive measures.   Based on the radiographic changes and failed conservative measures, the patient   decided to proceed with total knee replacement.  Risks of infection,   DVT, component failure, need for revision  surgery, postop course, and   expectations were all   discussed and reviewed.  Consent was obtained for benefit of pain   relief.      PROCEDURE IN DETAIL:  The patient was brought to the operative theater.   Once adequate anesthesia, preoperative antibiotics, 2 gm of Anecf administered, the patient was positioned supine with the right thigh tourniquet placed.  The  right lower extremity was prepped and draped in sterile fashion.  A time-   out was performed identifying the patient, planned procedure, and   extremity.      The right lower extremity was placed in the [facility] leg holder.  The leg was   exsanguinated, tourniquet elevated to 250 mmHg.  A midline incision was   made followed by median parapatellar arthrotomy.  Following initial   exposure, attention was first  directed to the patella.  Precut   measurement was noted to be 24 mm.  I resected down to 14 mm and used a   41 patellar button to restore patellar height as well as cover the cut   surface.      The lug holes were drilled and a metal shim was placed to protect the   patella from retractors and saw blades.      At this point, attention was now directed to the femur.  The femoral   canal was opened with a drill, irrigated to try to prevent fat emboli.  An   intramedullary rod was passed at 5 degrees valgus, 11 mm of bone was   resected off the distal femur due to pre-operative flexion contracture.  Following this resection, the tibia was   subluxated anteriorly.  Using the extramedullary guide, 2 mm of bone was resected off   the proximal medial tibia.  We confirmed the gap would be   stable medially and laterally with a 10 mm insert as well as confirmed   the cut was perpendicular in the coronal plane, checking with an alignment rod.      Once this was done, I sized the femur to be a size 4 in the anterior-   posterior dimension, chose a standard component based on medial and   lateral dimension.  The size 4 rotation  block was then pinned in   position anterior referenced using the C-clamp to set rotation.  The   anterior, posterior, and  chamfer cuts were made without difficulty nor   notching making certain that I was along the anterior cortex to help   with flexion gap stability.      The final box cut was made off the lateral aspect of distal femur.      At this point, the tibia was sized to be a size 4, the size 4 tray was   then pinned in position through the medial third of the tubercle,   drilled, and keel punched.  Trial reduction was now carried with a 4 femur,  4 tibia, a 10 then 12.5 mm insert, and the 41 patella botton.  The knee was brought to   extension, full extension with good flexion stability with the patella   tracking through the trochlea without application of pressure.  Given   all these findings, the trial components removed.  Final components were   opened and cement was mixed.  The knee was irrigated with normal saline   solution and pulse lavage.  The synovial lining was   then injected with 30cc of 0.25% Marcaine with epinephrine and 1 cc of Toradol,   total of 61 cc.      The knee was irrigated.  Final implants were then cemented onto clean and   dried cut surfaces of bone with the knee brought to extension with a 12.5 mm trial insert.      Once the cement had fully cured, the excess cement was removed   throughout the knee.  I confirmed I was satisfied with the range of   motion and stability, and the final 12.5 mm PS insert was chosen.  It was   placed into the knee.      The tourniquet had been let down at 33 minutes.  No significant   hemostasis required.  The   extensor mechanism was then reapproximated using #1 Vicryl and #0 V-lock sutures with the knee   in  flexion.  The   remaining wound was closed with 2-0 Vicryl and running 4-0 Monocryl.   The knee was cleaned, dried, dressed sterilely using Dermabond and   Aquacel dressing.  The patient was then    brought to recovery room in stable condition, tolerating the procedure   well.   Please note that Physician Assistant, Nehemiah Massed, PA-C, was present for the entirety of the case, and was utilized for pre-operative positioning, peri-operative retractor management, general facilitation of the procedure.  He was also utilized for primary wound closure at the end of the case.              Pietro Cassis Alvan Dame, M.D.    05/25/2014 10:12 AM

## 2014-05-26 LAB — CBC
HEMATOCRIT: 32.7 % — AB (ref 39.0–52.0)
Hemoglobin: 11 g/dL — ABNORMAL LOW (ref 13.0–17.0)
MCH: 29.6 pg (ref 26.0–34.0)
MCHC: 33.6 g/dL (ref 30.0–36.0)
MCV: 88.1 fL (ref 78.0–100.0)
Platelets: 152 10*3/uL (ref 150–400)
RBC: 3.71 MIL/uL — ABNORMAL LOW (ref 4.22–5.81)
RDW: 12.9 % (ref 11.5–15.5)
WBC: 11.3 10*3/uL — AB (ref 4.0–10.5)

## 2014-05-26 LAB — BASIC METABOLIC PANEL
Anion gap: 9 (ref 5–15)
BUN: 37 mg/dL — AB (ref 6–23)
CO2: 25 meq/L (ref 19–32)
Calcium: 8.2 mg/dL — ABNORMAL LOW (ref 8.4–10.5)
Chloride: 104 mEq/L (ref 96–112)
Creatinine, Ser: 1.25 mg/dL (ref 0.50–1.35)
GFR calc non Af Amer: 51 mL/min — ABNORMAL LOW (ref >90)
GFR, EST AFRICAN AMERICAN: 60 mL/min — AB (ref >90)
Glucose, Bld: 374 mg/dL — ABNORMAL HIGH (ref 70–99)
Potassium: 6 mEq/L — ABNORMAL HIGH (ref 3.7–5.3)
Sodium: 138 mEq/L (ref 137–147)

## 2014-05-26 LAB — GLUCOSE, CAPILLARY: Glucose-Capillary: 378 mg/dL — ABNORMAL HIGH (ref 70–99)

## 2014-05-26 MED ORDER — FERROUS SULFATE 325 (65 FE) MG PO TABS: 325.0000 mg | ORAL_TABLET | Freq: Three times a day (TID) | ORAL | Status: DC

## 2014-05-26 MED ORDER — POLYETHYLENE GLYCOL 3350 17 G PO PACK: 17.0000 g | PACK | Freq: Every day | ORAL | Status: DC | PRN

## 2014-05-26 MED ORDER — OXYCODONE HCL 5 MG PO TABS: 5.0000 mg | ORAL_TABLET | ORAL | Status: DC

## 2014-05-26 MED ORDER — TIZANIDINE HCL 4 MG PO TABS: 4.0000 mg | ORAL_TABLET | Freq: Four times a day (QID) | ORAL | Status: DC | PRN

## 2014-05-26 MED ORDER — DSS 100 MG PO CAPS: 100.0000 mg | ORAL_CAPSULE | Freq: Two times a day (BID) | ORAL | Status: DC

## 2014-05-26 MED ORDER — ASPIRIN 325 MG PO TBEC: 325.0000 mg | DELAYED_RELEASE_TABLET | Freq: Two times a day (BID) | ORAL | Status: DC

## 2014-05-26 NOTE — Plan of Care (Signed)
Problem: Consults Goal: Diagnosis- Total Joint Replacement Outcome: Completed/Met Date Met:  05/26/14 Primary Total Knee RIGHT

## 2014-05-26 NOTE — Care Management Note (Signed)
    Page 1 of 2   05/26/2014     10:44:50 AM CARE MANAGEMENT NOTE 05/26/2014  Patient:  Caleb Dougherty, Caleb Dougherty   Account Number:  [id number]  Date Initiated:  05/26/2014  Documentation initiated by:  [facility]  Subjective/Objective Assessment:   adm: RIGHT TOTAL KNEE ARTHROPLASTY (Right)     Action/Plan:   discharge planning   Anticipated DC Date:  05/26/2014   Anticipated DC Plan:  [place]  CM consult      [facility] Choice  HOME HEALTH   Choice offered to / List presented to:  C-1 Patient   DME arranged  NA      DME agency  NA     [place] arranged  HH-2 PT      [place]   Status of service:  Completed, signed off Medicare Important Message given?   (If response is "NO", the following Medicare IM given date fields will be blank) Date Medicare IM given:   Medicare IM given by:   Date Additional Medicare IM given:   Additional Medicare IM given by:    Discharge Disposition:  [place]  Per UR Regulation:    If discussed at Long Length of Stay Meetings, dates discussed:    Comments:  05/26/14 10:40 CM met with pt in room to offer choice of home health agency.  Pt chooses [facility] to render HHPT.  Pt has 3n1 and rolling walker at home from previous surgery. Address anc contact information verified with addition of wife' Faye's number [phone]. Referral called to [facility].  No other CM needs were communicated. Mariane Masters, BSN, CM [phone].

## 2014-05-26 NOTE — Progress Notes (Signed)
Utilization review completed.  

## 2014-05-26 NOTE — Progress Notes (Signed)
OT Cancellation Note  Patient Details Name: Caleb Dougherty MRN: [id number] DOB: 08-12-1928   Cancelled Treatment:    Reason Eval/Treat Not Completed: OT screened, no needs identified, will sign off.  Pt had other knee done 3 months ago; he and wife feel comfortable with ADLs and bathroom transfers.  [name] 05/26/2014, 10:41 AM Lesle Chris, OTR/L [phone] 05/26/2014

## 2014-05-26 NOTE — Progress Notes (Signed)
     Subjective: 1 Day Post-Op Procedure(s) (LRB): RIGHT TOTAL KNEE ARTHROPLASTY (Right)   Patient reports pain as mild, pain controlled. No events throughout the night. States that it is a little more painful than the last knee, but still not too bad. Ready to be discharged home.  Objective:   VITALS:   Filed Vitals:   05/26/14  BP: 101/50  Pulse: 68  Temp: 98.7 F (37.1 C)   Resp: 17    Dorsiflexion/Plantar flexion intact Incision: dressing C/D/I No cellulitis present Compartment soft  LABS  Recent Labs  05/26/14 0456  HGB 11.0*  HCT 32.7*  WBC 11.3*  PLT 152     Recent Labs  05/26/14 0456  NA 138  K 6.0*  BUN 37*  CREATININE 1.25  GLUCOSE 374*     Assessment/Plan: 1 Day Post-Op Procedure(s) (LRB): RIGHT TOTAL KNEE ARTHROPLASTY (Right) Foley cath d/c'ed  Advance diet Up with therapy D/C IV fluids Discharge home with home health Follow up in 2 weeks at [facility]. Follow up with OLIN,Erika Hussar D in 2 weeks.  Contact information:  [facility] [address] [place]   PAC  05/26/2014, 9:39 AM

## 2014-05-26 NOTE — Progress Notes (Signed)
Discharged from floor via w/c, wife with pt. No changes in assessment. [name]  

## 2014-05-26 NOTE — Plan of Care (Signed)
Problem: Phase III Progression Outcomes Goal: Anticoagulant follow-up in place Outcome: Not Applicable Date Met:  61/22/44 asa  Problem: Discharge Progression Outcomes Goal: Anticoagulant follow-up in place Outcome: Not Applicable Date Met:  97/53/00 asa Goal: Complications resolved/controlled Outcome: Completed/Met Date Met:  05/26/14 Voiding qs after catheter d/c'd

## 2014-05-26 NOTE — Progress Notes (Signed)
Physical Therapy Treatment Patient Details Name: Caleb Dougherty MRN: [id number] DOB: 1928/09/14 Today's Date: 05/26/2014    History of Present Illness Pt is an 80 year old male s/p R TKA with hx of L TKA 3 months ago.    PT Comments    Pt doing excellent for POD #1 and reports pain better controlled today (had issues last night).   Pt ambulated, practiced steps with spouse, and performed LE exercises.  Pt and spouse feel ready for d/c home today.  Follow Up Recommendations  Home health PT     Equipment Recommendations  None recommended by PT    Recommendations for Other Services       Precautions / Restrictions Precautions Precautions: Knee Restrictions Weight Bearing Restrictions: Yes RLE Weight Bearing: Weight bearing as tolerated    Mobility  Bed Mobility Overal bed mobility: Needs Assistance Bed Mobility: Supine to Sit     Supine to sit: Supervision     General bed mobility comments: verbal cues for sequence  Transfers Overall transfer level: Needs assistance Equipment used: Rolling walker (2 wheeled) Transfers: Sit to/from Stand Sit to Stand: Min guard;Supervision         General transfer comment: good hand placement  Ambulation/Gait Ambulation/Gait assistance: Supervision Ambulation Distance (Feet): 160 Feet Assistive device: Rolling walker (2 wheeled) Gait Pattern/deviations: Step-to pattern;Antalgic     General Gait Details: verbal cues for RW distance, step length, heel strike   Stairs Stairs: Yes Stairs assistance: Min assist Stair Management: Step to pattern;Backwards;With walker Number of Stairs: 2 (twice) General stair comments: verbal cues for safety, sequence, RW positioning, pt's spouse held RW, max cues for sequence, assist for first attempt provided to steady and min/guard upon practicing a second time, provided handout on stair technique  Wheelchair Mobility    Modified Rankin (Stroke Patients Only)       Balance                                     Cognition Arousal/Alertness: Awake/alert Behavior During Therapy: WFL for tasks assessed/performed Overall Cognitive Status: Within Functional Limits for tasks assessed                      Exercises Total Joint Exercises Ankle Circles/Pumps: AROM;Both;15 reps Heel Slides: AROM;Supine;Right;15 reps Hip ABduction/ADduction: AROM;Right;15 reps Straight Leg Raises: AROM;Right;10 reps Long Arc Quad: AROM;Right;15 reps Marching in Standing: Seated;AROM;Right;15 reps    General Comments        Pertinent Vitals/Pain Pain Assessment: No/denies pain Pain Intervention(s): Monitored during session;Ice applied;Repositioned;Premedicated before session    Home Living                      Prior Function            PT Goals (current goals can now be found in the care plan section) Progress towards PT goals: Progressing toward goals    Frequency  7X/week    PT Plan Current plan remains appropriate    Co-evaluation             End of Session   Activity Tolerance: Patient tolerated treatment well Patient left: with call bell/phone within reach;in chair;with family/visitor present     Time: 3704-8889 PT Time Calculation (min): 24 min  Charges:  $Gait Training: 8-22 mins $Therapeutic Exercise: 8-22 mins  G Codes:      Zakariyya Helfman,KATHrine E 2014-05-29, 11:48 AM Carmelia Bake, PT, DPT 2014-05-29 Pager: [phone]

## 2014-05-28 NOTE — Discharge Summary (Signed)
Physician Discharge Summary  Patient ID: Caleb Dougherty MRN: [id number] DOB/AGE: 1928-09-25 80 y.o.  Admit date: 05/25/2014 Discharge date: 05/26/2014   Procedures:  Procedure(s) (LRB): RIGHT TOTAL KNEE ARTHROPLASTY (Right)  Attending Physician:  Dr. Paralee Cancel   Admission Diagnoses:   Right knee OA / pain  Discharge Diagnoses:  Principal Problem:   S/P right TKA Active Problems:   DJD (degenerative joint disease) of knee  Past Medical History  Diagnosis Date  . Wears dentures   . Wears glasses     reading  . Hypertension   . BPH (benign prostatic hypertrophy)   . Cataract     02-12-14 left- ready for surgery, but only has had right done  . Impaired hearing     hard of hearing -no hearing aids  . Diabetes mellitus without complication     diabetes x4 yrs-oral med and Lantus used  . Arthritis     osteoarthritis-knees and feet  . Cancer     melanoma(back)-no futher issues. Skin cancers face "frozen"    HPI: Caleb Dougherty, 80 y.o. male, has a history of pain and functional disability in the right knee due to arthritis and has failed non-surgical conservative treatments for greater than 12 weeks to include NSAID's and/or analgesics, corticosteriod injections, use of assistive devices and activity modification. Onset of symptoms was gradual, starting 3+ years ago with gradually worsening course since that time. The patient noted prior procedures on the knee to include arthroplasty on the left knee(s). Patient currently rates pain in the left knee(s) at 8 out of 10 with activity. Patient has worsening of pain with activity and weight bearing, pain that interferes with activities of daily living, pain with passive range of motion, crepitus and joint swelling. Patient has evidence of periarticular osteophytes and joint space narrowing by imaging studies. There is no active infection. Risks, benefits and expectations were discussed with the patient. Risks including but not  limited to the risk of anesthesia, blood clots, nerve damage, blood vessel damage, failure of the prosthesis, infection and up to and including death. Patient understand the risks, benefits and expectations and wishes to proceed with surgery.   PCP: [name], MD   Discharged Condition: good  Hospital Course:  Patient underwent the above stated procedure on 05/25/2014. Patient tolerated the procedure well and brought to the recovery room in good condition and subsequently to the floor.  POD #1 BP: 101/50 ; Pulse: 68 ; Temp: 98.7 F (37.1 C) ; Resp: 17 Patient reports pain as mild, pain controlled. No events throughout the night. States that it is a little more painful than the last knee, but still not too bad. Ready to be discharged home. Dorsiflexion/plantar flexion intact, incision: dressing C/D/I, no cellulitis present and compartment soft.   LABS  Basename    HGB  11.0  HCT  32.7    Discharge Exam: General appearance: alert, cooperative and no distress Extremities: Homans sign is negative, no sign of DVT, no edema, redness or tenderness in the calves or thighs and no ulcers, gangrene or trophic changes  Disposition: Home with follow up in 2 weeks   Follow-up Information   Follow up with Mauri Pole, MD. Schedule an appointment as soon as possible for a visit in 2 weeks.   Specialty:  Orthopedic Surgery   Contact information:   [address] [place] [address] [phone]       Follow up with [facility] (home health physical therapy)  Contact information:   [place] [address] [phone]       Discharge Instructions   Call MD / Call 911    Complete by:  As directed   If you experience chest pain or shortness of breath, CALL 911 and be transported to the hospital emergency room.  If you develope a fever above 101 F, pus (white drainage) or increased drainage or redness at the wound, or calf pain,  call your surgeon's office.     Change dressing    Complete by:  As directed   Maintain surgical dressing for 10-14 days, or until follow up in the clinic.     Constipation Prevention    Complete by:  As directed   Drink plenty of fluids.  Prune juice may be helpful.  You may use a stool softener, such as Colace (over the counter) 100 mg twice a day.  Use MiraLax (over the counter) for constipation as needed.     Diet - low sodium heart healthy    Complete by:  As directed      Discharge instructions    Complete by:  As directed   Maintain surgical dressing for 10-14 days, or until follow up in the clinic. Follow up in 2 weeks at [facility]. Call with any questions or concerns.     Driving restrictions    Complete by:  As directed   No driving for 4 weeks     Increase activity slowly as tolerated    Complete by:  As directed      TED hose    Complete by:  As directed   Use stockings (TED hose) for 2 weeks on both leg(s).  You may remove them at night for sleeping.     Weight bearing as tolerated    Complete by:  As directed   Laterality:  right  Extremity:  Lower             Medication List    STOP taking these medications       acetaminophen-codeine 300-30 MG per tablet  Commonly known as:  TYLENOL #3     naproxen 250 MG tablet  Commonly known as:  NAPROSYN      TAKE these medications       aspirin 325 MG EC tablet  Take 1 tablet (325 mg total) by mouth 2 (two) times daily.     DSS 100 MG Caps  Take 100 mg by mouth 2 (two) times daily.     ferrous sulfate 325 (65 FE) MG tablet  Take 1 tablet (325 mg total) by mouth 3 (three) times daily after meals.     fosinopril 40 MG tablet  Commonly known as:  MONOPRIL  Take 40 mg by mouth every evening.     glyBURIDE 5 MG tablet  Commonly known as:  DIABETA  Take 10 mg by mouth 2 (two) times daily with a meal. Takes 2=10mg      hydrochlorothiazide 25 MG tablet  Commonly known as:  HYDRODIURIL  Take 25 mg  by mouth every evening.     insulin glargine 100 UNIT/ML injection  Commonly known as:  LANTUS  Inject 25 Units into the skin every morning.     multivitamin with minerals Tabs tablet  Take 1 tablet by mouth every morning.     oxyCODONE 5 MG immediate release tablet  Commonly known as:  Oxy IR/ROXICODONE  Take 1-3 tablets (5-15 mg total) by mouth  every 4 (four) hours.     polyethylene glycol packet  Commonly known as:  MIRALAX / GLYCOLAX  Take 17 g by mouth daily as needed for mild constipation.     simvastatin 40 MG tablet  Commonly known as:  ZOCOR  Take 40 mg by mouth every evening.     terazosin 5 MG capsule  Commonly known as:  HYTRIN  Take 5 mg by mouth at bedtime.     tiZANidine 4 MG tablet  Commonly known as:  ZANAFLEX  Take 1 tablet (4 mg total) by mouth every 6 (six) hours as needed for muscle spasms.            Signed: [place]. Nalany Steedley   PA-C  05/28/2014, 1:42 PM

## 2014-11-04 NOTE — H&P (Signed)
Caleb Dougherty is an 81 y.o. male.    Chief Complaint: left shoulder pain and weakness  HPI: Pt is a 81 y.o. male complaining of left shoulder pain for multiple years. Pain had continually increased since the beginning. X-rays in the clinic show end-stage arthritic changes of the left shoulder. Pt has tried various conservative treatments which have failed to alleviate their symptoms, including injections and therapy. Various options are discussed with the patient. Risks, benefits and expectations were discussed with the patient. Patient understand the risks, benefits and expectations and wishes to proceed with surgery.   PCP:  [name], MD  D/C Plans:  Home   PMH: Past Medical History  Diagnosis Date  . Wears dentures   . Wears glasses     reading  . Hypertension   . BPH (benign prostatic hypertrophy)   . Cataract     02-12-14 left- ready for surgery, but only has had right done  . Impaired hearing     hard of hearing -no hearing aids  . Diabetes mellitus without complication     diabetes x4 yrs-oral med and Lantus used  . Arthritis     osteoarthritis-knees and feet  . Cancer     melanoma(back)-no futher issues. Skin cancers face "frozen"    PSH: Past Surgical History  Procedure Laterality Date  . Colonoscopy    . Cataract extraction Right   . Carpal tunnel release Right 06/23/2013    Procedure: RIGHT CARPAL TUNNEL RELEASE;  Surgeon: Cammie Sickle., MD;  Location: [facility];  Service: Orthopedics;  Laterality: Right;  . Total knee arthroplasty Left 02/23/2014    Procedure: LEFT TOTAL KNEE REPLACEMENT;  Surgeon: Mauri Pole, MD;  Location: [facility];  Service: Orthopedics;  Laterality: Left;  . Total knee arthroplasty Right 05/25/2014    Procedure: RIGHT TOTAL KNEE ARTHROPLASTY;  Surgeon: Mauri Pole, MD;  Location: [facility];  Service: Orthopedics;  Laterality: Right;    Social History:  reports that he has never smoked. He has never used  smokeless tobacco. He reports that he does not drink alcohol or use illicit drugs.  Allergies:  No Known Allergies  Medications: No current facility-administered medications for this encounter.   Current Outpatient Prescriptions  Medication Sig Dispense Refill  . aspirin EC 325 MG EC tablet Take 1 tablet (325 mg total) by mouth 2 (two) times daily. 60 tablet 0  . docusate sodium 100 MG CAPS Take 100 mg by mouth 2 (two) times daily. 10 capsule 0  . ferrous sulfate 325 (65 FE) MG tablet Take 1 tablet (325 mg total) by mouth 3 (three) times daily after meals.  3  . fosinopril (MONOPRIL) 40 MG tablet Take 40 mg by mouth every evening.    . glyBURIDE (DIABETA) 5 MG tablet Take 10 mg by mouth 2 (two) times daily with a meal. Takes 2=10mg     . hydrochlorothiazide (HYDRODIURIL) 25 MG tablet Take 25 mg by mouth every evening.     . insulin glargine (LANTUS) 100 UNIT/ML injection Inject 25 Units into the skin every morning.     . Multiple Vitamin (MULTIVITAMIN WITH MINERALS) TABS tablet Take 1 tablet by mouth every morning.     Marland Kitchen oxyCODONE (OXY IR/ROXICODONE) 5 MG immediate release tablet Take 1-3 tablets (5-15 mg total) by mouth every 4 (four) hours. 120 tablet 0  . polyethylene glycol (MIRALAX / GLYCOLAX) packet Take 17 g by mouth daily as needed for mild constipation. 14 each 0  .  simvastatin (ZOCOR) 40 MG tablet Take 40 mg by mouth every evening.    . terazosin (HYTRIN) 5 MG capsule Take 5 mg by mouth at bedtime.    Marland Kitchen tiZANidine (ZANAFLEX) 4 MG tablet Take 1 tablet (4 mg total) by mouth every 6 (six) hours as needed for muscle spasms. 30 tablet 0    No results found for this or any previous visit (from the past 48 hour(s)). No results found.  ROS: Pain with rom of the left upper extremity  Physical Exam:  Alert and oriented 81 y.o. male in no acute distress Cranial nerves 2-12 intact Cervical spine: full rom with no tenderness, nv intact distally Chest: active breath sounds bilaterally,  no wheeze rhonchi or rales Heart: regular rate and rhythm, no murmur Abd: non tender non distended with active bowel sounds Hip is stable with rom  Left shoulder with restricted rom and weakness nv intact distally No rashes or edema Strength of ER and IR 3/5  Assessment/Plan Assessment: left shoulder cuff insufficiency  Plan: Patient will undergo a left reverse total shoulder by Dr. Veverly Fells at [facility]. Risks benefits and expectations were discussed with the patient. Patient understand risks, benefits and expectations and wishes to proceed.

## 2014-11-08 NOTE — Pre-Procedure Instructions (Signed)
Caleb Dougherty  11/08/2014   Your procedure is scheduled on:  Friday November 19, 2014 at 1:40 PM.  Report to [facility] Admitting at 11:40 AM.  Call this number if you have problems the morning of surgery: [phone]   Remember:   Do not eat food or drink liquids after midnight.   Take these medicines the morning of surgery with A SIP OF WATER: NONE   Do NOT take any diabetic medications the morning of your surgery   Stop taking any ibuprofen, Advil, Motrin, Alleve, Vitamins, Herbal medications on Friday April 15th   Do not wear jewelry.  Do not wear lotions, powders, or cologne.   Men may shave face and neck.  Do not bring valuables to the hospital.  [name] is not responsible for any belongings or valuables.               Contacts, dentures or bridgework may not be worn into surgery.  Leave suitcase in the car. After surgery it may be brought to your room.  For patients admitted to the hospital, discharge time is determined by your treatment team.               Patients discharged the day of surgery will not be allowed to drive home.  Name and phone number of your driver:   Special Instructions: Shower using CHG soap the night before and the morning of your surgery   Please read over the following fact sheets that you were given: Pain Booklet, Coughing and Deep Breathing, MRSA Information and Surgical Site Infection Prevention

## 2014-11-09 ENCOUNTER — Encounter (HOSPITAL_COMMUNITY)
Admission: RE | Admit: 2014-11-09 | Discharge: 2014-11-09 | Disposition: A | Discharge status: Home / Self Care | Payer: Medicare PPO | Source: Ambulatory Visit | Attending: Orthopedic Surgery | Admitting: Orthopedic Surgery

## 2014-11-09 ENCOUNTER — Encounter (HOSPITAL_COMMUNITY): Payer: Self-pay

## 2014-11-09 DIAGNOSIS — Z01810 Encounter for preprocedural cardiovascular examination: Secondary | ICD-10-CM | POA: Diagnosis not present

## 2014-11-09 DIAGNOSIS — Z01812 Encounter for preprocedural laboratory examination: Secondary | ICD-10-CM | POA: Insufficient documentation

## 2014-11-09 HISTORY — DX: Urgency of urination: R39.15

## 2014-11-09 HISTORY — DX: Unspecified hearing loss, unspecified ear: H91.90

## 2014-11-09 LAB — SURGICAL PCR SCREEN
MRSA, PCR: NEGATIVE
Staphylococcus aureus: NEGATIVE

## 2014-11-09 LAB — BASIC METABOLIC PANEL
Anion gap: 11 (ref 5–15)
BUN: 34 mg/dL — ABNORMAL HIGH (ref 6–23)
CALCIUM: 9.4 mg/dL (ref 8.4–10.5)
CO2: 26 mmol/L (ref 19–32)
Chloride: 102 mmol/L (ref 96–112)
Creatinine, Ser: 1.21 mg/dL (ref 0.50–1.35)
GFR calc Af Amer: 62 mL/min — ABNORMAL LOW (ref >90)
GFR, EST NON AFRICAN AMERICAN: 54 mL/min — AB (ref >90)
GLUCOSE: 269 mg/dL — AB (ref 70–99)
Potassium: 4.9 mmol/L (ref 3.5–5.1)
SODIUM: 139 mmol/L (ref 135–145)

## 2014-11-09 LAB — CBC
HEMATOCRIT: 40.9 % (ref 39.0–52.0)
Hemoglobin: 13.7 g/dL (ref 13.0–17.0)
MCH: 29.9 pg (ref 26.0–34.0)
MCHC: 33.5 g/dL (ref 30.0–36.0)
MCV: 89.3 fL (ref 78.0–100.0)
Platelets: 178 10*3/uL (ref 150–400)
RBC: 4.58 MIL/uL (ref 4.22–5.81)
RDW: 12.8 % (ref 11.5–15.5)
WBC: 5.5 10*3/uL (ref 4.0–10.5)

## 2014-11-09 LAB — ABO/RH: ABO/RH(D): A NEG

## 2014-11-09 LAB — TYPE AND SCREEN
ABO/RH(D): A NEG
Antibody Screen: NEGATIVE

## 2014-11-09 NOTE — Progress Notes (Signed)
PCP is [name]. Patient denied having any acute cardiac or pulmonary issues.   Patient is hard of hearing. Wife at chair side during PAT visit.  Patient informed Nurse that his fasting blood glucose is usually around 100.

## 2014-11-10 NOTE — Progress Notes (Signed)
Anesthesia Chart Review:  Patient is a 81 year old male scheduled for left reverse total shoulder arthroplasty on 11/19/14 by Dr. Veverly Fells.  History includes DM2 on insulin, non-smoker, HTN, impaired hearing, arthritis, melanoma, bilateral TKA '15. PCP is Dr. [name] who felt patient was stable for surgery (see Care Everywhere).   Meds include ASA, fosinopril, HCTZ, Lantus, Zocor, Hytrin.  11/09/14 EKG: SR with first degree AVB, right BBB. QRS was increased from 112 ms to 124 ms since his 02/03/14 EKG scanned under Media tab.  Preoperative labs noted. Non-fasting glucose 269. A1C 09/02/14 was 9.7 (down from 9.9; Care Everywhere). I called to speak with patient who was unavailable.  His wife reported his fasting glucose levels have been running high 70's to low 100's. He normally takes his Lantus in the morning. His surgery is not until the afternoon, so they are concerned about hypoglycemia. He is not planning to take Lantus that morning.  I did explain that he may be able to take up to 50% of his dose, but they will see how his glucose is running that morning.  She was told that if there were concerns regarding hypo- or hyperglycemia on the morning of surgery then they could call [facility] for input or arrive early for closer monitoring of his blood sugars.  [facility]/Anesthesiology Phone [phone] 11/10/2014 6:03 PM

## 2014-11-18 MED ORDER — CHLORHEXIDINE GLUCONATE 4 % EX LIQD
60.0000 mL | Freq: Once | CUTANEOUS | Status: DC
  Filled 2014-11-18: qty 60

## 2014-11-18 MED ORDER — CEFAZOLIN SODIUM-DEXTROSE 2-3 GM-% IV SOLR
2.0000 g | INTRAVENOUS | Status: AC
  Administered 2014-11-19: 2 g via INTRAVENOUS
  Filled 2014-11-18: qty 50

## 2014-11-19 ENCOUNTER — Inpatient Hospital Stay (HOSPITAL_COMMUNITY)
Admission: RE | Admit: 2014-11-19 | Discharge: 2014-11-20 | DRG: 483 | Disposition: A | Discharge status: Home / Self Care | Payer: Medicare PPO | Source: Ambulatory Visit | Attending: Orthopedic Surgery | Admitting: Orthopedic Surgery

## 2014-11-19 ENCOUNTER — Inpatient Hospital Stay (HOSPITAL_COMMUNITY): Payer: Medicare PPO | Admitting: Vascular Surgery

## 2014-11-19 ENCOUNTER — Encounter (HOSPITAL_COMMUNITY)
Admission: RE | Disposition: A | Discharge status: Home / Self Care | Payer: Self-pay | Source: Ambulatory Visit | Attending: Orthopedic Surgery

## 2014-11-19 ENCOUNTER — Inpatient Hospital Stay (HOSPITAL_COMMUNITY): Payer: Medicare PPO

## 2014-11-19 ENCOUNTER — Encounter (HOSPITAL_COMMUNITY): Payer: Self-pay | Admitting: *Deleted

## 2014-11-19 ENCOUNTER — Inpatient Hospital Stay (HOSPITAL_COMMUNITY): Payer: Medicare PPO | Admitting: Certified Registered"

## 2014-11-19 DIAGNOSIS — Z79899 Other long term (current) drug therapy: Secondary | ICD-10-CM | POA: Diagnosis not present

## 2014-11-19 DIAGNOSIS — Z794 Long term (current) use of insulin: Secondary | ICD-10-CM

## 2014-11-19 DIAGNOSIS — H9190 Unspecified hearing loss, unspecified ear: Secondary | ICD-10-CM | POA: Diagnosis present

## 2014-11-19 DIAGNOSIS — M75102 Unspecified rotator cuff tear or rupture of left shoulder, not specified as traumatic: Secondary | ICD-10-CM | POA: Diagnosis present

## 2014-11-19 DIAGNOSIS — E119 Type 2 diabetes mellitus without complications: Secondary | ICD-10-CM | POA: Diagnosis present

## 2014-11-19 DIAGNOSIS — M12812 Other specific arthropathies, not elsewhere classified, left shoulder: Principal | ICD-10-CM | POA: Diagnosis present

## 2014-11-19 DIAGNOSIS — I10 Essential (primary) hypertension: Secondary | ICD-10-CM | POA: Diagnosis present

## 2014-11-19 DIAGNOSIS — N400 Enlarged prostate without lower urinary tract symptoms: Secondary | ICD-10-CM | POA: Diagnosis present

## 2014-11-19 DIAGNOSIS — Z96612 Presence of left artificial shoulder joint: Secondary | ICD-10-CM

## 2014-11-19 DIAGNOSIS — Z96653 Presence of artificial knee joint, bilateral: Secondary | ICD-10-CM | POA: Diagnosis present

## 2014-11-19 DIAGNOSIS — M25512 Pain in left shoulder: Secondary | ICD-10-CM | POA: Diagnosis present

## 2014-11-19 DIAGNOSIS — Z7982 Long term (current) use of aspirin: Secondary | ICD-10-CM | POA: Diagnosis not present

## 2014-11-19 DIAGNOSIS — Z96619 Presence of unspecified artificial shoulder joint: Secondary | ICD-10-CM

## 2014-11-19 HISTORY — PX: REVERSE SHOULDER ARTHROPLASTY: SHX5054

## 2014-11-19 LAB — GLUCOSE, CAPILLARY
GLUCOSE-CAPILLARY: 142 mg/dL — AB (ref 70–99)
Glucose-Capillary: 121 mg/dL — ABNORMAL HIGH (ref 70–99)

## 2014-11-19 SURGERY — ARTHROPLASTY, SHOULDER, TOTAL, REVERSE
Anesthesia: General | Site: Shoulder | Laterality: Left

## 2014-11-19 MED ORDER — HYDROMORPHONE HCL 1 MG/ML IJ SOLN
0.2500 mg | INTRAMUSCULAR | Status: DC | PRN
Start: 2014-11-19 — End: 2014-11-19

## 2014-11-19 MED ORDER — BISACODYL 10 MG RE SUPP: 10.0000 mg | Freq: Every day | RECTAL | Status: DC | PRN

## 2014-11-19 MED ORDER — FENTANYL CITRATE (PF) 100 MCG/2ML IJ SOLN
INTRAMUSCULAR | Status: AC
  Administered 2014-11-19: 25 ug via INTRAVENOUS
  Filled 2014-11-19: qty 2

## 2014-11-19 MED ORDER — HYDROMORPHONE HCL 1 MG/ML IJ SOLN
0.5000 mg | INTRAMUSCULAR | Status: DC | PRN
  Administered 2014-11-20 (×2): 0.5 mg via INTRAVENOUS
  Filled 2014-11-19 (×2): qty 1

## 2014-11-19 MED ORDER — 0.9 % SODIUM CHLORIDE (POUR BTL) OPTIME
TOPICAL | Status: DC | PRN
  Administered 2014-11-19: 1000 mL

## 2014-11-19 MED ORDER — MEPERIDINE HCL 25 MG/ML IJ SOLN: 6.2500 mg | INTRAMUSCULAR | Status: DC | PRN

## 2014-11-19 MED ORDER — ONDANSETRON HCL 4 MG/2ML IJ SOLN: 4.0000 mg | Freq: Four times a day (QID) | INTRAMUSCULAR | Status: DC | PRN

## 2014-11-19 MED ORDER — PHENYLEPHRINE HCL 10 MG/ML IJ SOLN
10.0000 mg | INTRAVENOUS | Status: DC | PRN
  Administered 2014-11-19: 25 ug/min via INTRAVENOUS

## 2014-11-19 MED ORDER — SIMVASTATIN 40 MG PO TABS
40.0000 mg | ORAL_TABLET | Freq: Every evening | ORAL | Status: DC
  Administered 2014-11-19: 40 mg via ORAL
  Filled 2014-11-19: qty 1

## 2014-11-19 MED ORDER — MIDAZOLAM HCL 2 MG/2ML IJ SOLN
INTRAMUSCULAR | Status: AC
  Administered 2014-11-19: 2 mg via INTRAVENOUS
  Filled 2014-11-19: qty 2

## 2014-11-19 MED ORDER — MENTHOL 3 MG MT LOZG: 1.0000 | LOZENGE | OROMUCOSAL | Status: DC | PRN

## 2014-11-19 MED ORDER — POLYETHYLENE GLYCOL 3350 17 G PO PACK: 17.0000 g | PACK | Freq: Every day | ORAL | Status: DC | PRN

## 2014-11-19 MED ORDER — FERROUS SULFATE 325 (65 FE) MG PO TABS: 325.0000 mg | ORAL_TABLET | Freq: Three times a day (TID) | ORAL | Status: DC

## 2014-11-19 MED ORDER — ONDANSETRON HCL 4 MG/2ML IJ SOLN
INTRAMUSCULAR | Status: DC | PRN
  Administered 2014-11-19: 4 mg via INTRAVENOUS

## 2014-11-19 MED ORDER — FENTANYL CITRATE (PF) 250 MCG/5ML IJ SOLN
INTRAMUSCULAR | Status: AC
  Filled 2014-11-19: qty 5

## 2014-11-19 MED ORDER — TERAZOSIN HCL 5 MG PO CAPS
5.0000 mg | ORAL_CAPSULE | Freq: Every day | ORAL | Status: DC
  Administered 2014-11-19: 5 mg via ORAL
  Filled 2014-11-19 (×2): qty 1

## 2014-11-19 MED ORDER — ACETAMINOPHEN 650 MG RE SUPP: 650.0000 mg | Freq: Four times a day (QID) | RECTAL | Status: DC | PRN

## 2014-11-19 MED ORDER — GLYBURIDE 5 MG PO TABS
10.0000 mg | ORAL_TABLET | Freq: Two times a day (BID) | ORAL | Status: DC
  Administered 2014-11-20: 10 mg via ORAL
  Filled 2014-11-19 (×3): qty 2

## 2014-11-19 MED ORDER — METOCLOPRAMIDE HCL 5 MG PO TABS: 5.0000 mg | ORAL_TABLET | Freq: Three times a day (TID) | ORAL | Status: DC | PRN

## 2014-11-19 MED ORDER — FENTANYL CITRATE (PF) 100 MCG/2ML IJ SOLN
INTRAMUSCULAR | Status: DC | PRN
  Administered 2014-11-19 (×3): 50 ug via INTRAVENOUS

## 2014-11-19 MED ORDER — METOCLOPRAMIDE HCL 5 MG/ML IJ SOLN: 5.0000 mg | Freq: Three times a day (TID) | INTRAMUSCULAR | Status: DC | PRN

## 2014-11-19 MED ORDER — DOCUSATE SODIUM 100 MG PO CAPS
100.0000 mg | ORAL_CAPSULE | Freq: Two times a day (BID) | ORAL | Status: DC
  Administered 2014-11-19 – 2014-11-20 (×2): 100 mg via ORAL
  Filled 2014-11-19 (×2): qty 1

## 2014-11-19 MED ORDER — OXYCODONE HCL 5 MG PO TABS: 5.0000 mg | ORAL_TABLET | ORAL | Status: DC | PRN

## 2014-11-19 MED ORDER — PROPOFOL 10 MG/ML IV BOLUS: INTRAVENOUS | Status: DC | PRN

## 2014-11-19 MED ORDER — PHENOL 1.4 % MT LIQD: 1.0000 | OROMUCOSAL | Status: DC | PRN

## 2014-11-19 MED ORDER — PROPOFOL 10 MG/ML IV BOLUS
INTRAVENOUS | Status: DC | PRN
  Administered 2014-11-19: 140 mg via INTRAVENOUS
  Administered 2014-11-19: 20 mg via INTRAVENOUS

## 2014-11-19 MED ORDER — ONDANSETRON HCL 4 MG/2ML IJ SOLN: 4.0000 mg | Freq: Once | INTRAMUSCULAR | Status: DC | PRN

## 2014-11-19 MED ORDER — PROPOFOL 10 MG/ML IV BOLUS
INTRAVENOUS | Status: AC
  Filled 2014-11-19: qty 20

## 2014-11-19 MED ORDER — OXYCODONE HCL 5 MG PO TABS: 5.0000 mg | ORAL_TABLET | ORAL | Status: DC

## 2014-11-19 MED ORDER — DEXAMETHASONE SODIUM PHOSPHATE 4 MG/ML IJ SOLN
INTRAMUSCULAR | Status: DC | PRN
  Administered 2014-11-19: 4 mg via INTRAVENOUS

## 2014-11-19 MED ORDER — EPHEDRINE SULFATE 50 MG/ML IJ SOLN
INTRAMUSCULAR | Status: DC | PRN
  Administered 2014-11-19: 5 mg via INTRAVENOUS
  Administered 2014-11-19: 10 mg via INTRAVENOUS
  Administered 2014-11-19: 5 mg via INTRAVENOUS

## 2014-11-19 MED ORDER — ASPIRIN EC 325 MG PO TBEC
325.0000 mg | DELAYED_RELEASE_TABLET | Freq: Two times a day (BID) | ORAL | Status: DC
  Administered 2014-11-19 – 2014-11-20 (×2): 325 mg via ORAL
  Filled 2014-11-19 (×2): qty 1

## 2014-11-19 MED ORDER — BUPIVACAINE-EPINEPHRINE (PF) 0.25% -1:200000 IJ SOLN
INTRAMUSCULAR | Status: AC
  Filled 2014-11-19: qty 30

## 2014-11-19 MED ORDER — TIZANIDINE HCL 4 MG PO TABS: 4.0000 mg | ORAL_TABLET | Freq: Four times a day (QID) | ORAL | Status: DC | PRN

## 2014-11-19 MED ORDER — BUPIVACAINE-EPINEPHRINE (PF) 0.5% -1:200000 IJ SOLN
INTRAMUSCULAR | Status: DC | PRN
  Administered 2014-11-19: 30 mL via PERINEURAL

## 2014-11-19 MED ORDER — PHENYLEPHRINE HCL 10 MG/ML IJ SOLN
INTRAMUSCULAR | Status: DC | PRN
  Administered 2014-11-19 (×2): 80 ug via INTRAVENOUS

## 2014-11-19 MED ORDER — FOSINOPRIL SODIUM 20 MG PO TABS
40.0000 mg | ORAL_TABLET | Freq: Every evening | ORAL | Status: DC
  Administered 2014-11-19: 40 mg via ORAL
  Filled 2014-11-19 (×2): qty 2

## 2014-11-19 MED ORDER — LACTATED RINGERS IV SOLN
INTRAVENOUS | Status: DC
  Administered 2014-11-19 (×3): via INTRAVENOUS

## 2014-11-19 MED ORDER — SODIUM CHLORIDE 0.9 % IV SOLN
INTRAVENOUS | Status: DC
  Administered 2014-11-19: via INTRAVENOUS

## 2014-11-19 MED ORDER — ONDANSETRON HCL 4 MG PO TABS: 4.0000 mg | ORAL_TABLET | Freq: Four times a day (QID) | ORAL | Status: DC | PRN

## 2014-11-19 MED ORDER — LIDOCAINE HCL (CARDIAC) 20 MG/ML IV SOLN
INTRAVENOUS | Status: DC | PRN
  Administered 2014-11-19: 30 mg via INTRAVENOUS

## 2014-11-19 MED ORDER — CEFAZOLIN SODIUM-DEXTROSE 2-3 GM-% IV SOLR
2.0000 g | Freq: Four times a day (QID) | INTRAVENOUS | Status: AC
  Administered 2014-11-19 – 2014-11-20 (×3): 2 g via INTRAVENOUS
  Filled 2014-11-19 (×3): qty 50

## 2014-11-19 MED ORDER — ADULT MULTIVITAMIN W/MINERALS CH
1.0000 | ORAL_TABLET | ORAL | Status: DC
  Administered 2014-11-20: 1 via ORAL
  Filled 2014-11-19: qty 1

## 2014-11-19 MED ORDER — SUCCINYLCHOLINE CHLORIDE 20 MG/ML IJ SOLN
INTRAMUSCULAR | Status: DC | PRN
  Administered 2014-11-19: 100 mg via INTRAVENOUS

## 2014-11-19 MED ORDER — HYDROCHLOROTHIAZIDE 25 MG PO TABS
25.0000 mg | ORAL_TABLET | Freq: Every evening | ORAL | Status: DC
  Administered 2014-11-19: 25 mg via ORAL
  Filled 2014-11-19: qty 1

## 2014-11-19 MED ORDER — INSULIN GLARGINE 100 UNIT/ML ~~LOC~~ SOLN
25.0000 [IU] | Freq: Every morning | SUBCUTANEOUS | Status: DC
  Administered 2014-11-20: 25 [IU] via SUBCUTANEOUS
  Filled 2014-11-19: qty 0.25

## 2014-11-19 MED ORDER — ACETAMINOPHEN 325 MG PO TABS
650.0000 mg | ORAL_TABLET | Freq: Four times a day (QID) | ORAL | Status: DC | PRN
  Administered 2014-11-20: 650 mg via ORAL
  Filled 2014-11-19: qty 2

## 2014-11-19 MED ORDER — NAPROXEN 250 MG PO TABS
500.0000 mg | ORAL_TABLET | Freq: Two times a day (BID) | ORAL | Status: DC
  Administered 2014-11-19 – 2014-11-20 (×2): 500 mg via ORAL
  Filled 2014-11-19 (×2): qty 2

## 2014-11-19 MED ORDER — BUPIVACAINE-EPINEPHRINE 0.25% -1:200000 IJ SOLN
INTRAMUSCULAR | Status: DC | PRN
  Administered 2014-11-19: 7 mL

## 2014-11-19 SURGICAL SUPPLY — 66 items
BIT DRILL 170X2.5X (BIT) IMPLANT
BIT DRILL 5/64X5 DISP (BIT) ×3 IMPLANT
BIT DRL 170X2.5X (BIT)
BLADE SAG 18X100X1.27 (BLADE) ×3 IMPLANT
CAPT SHLDR REVTOTAL 1 ×2 IMPLANT
CLOSURE STERI-STRIP 1/2X4 (GAUZE/BANDAGES/DRESSINGS) ×1
CLOSURE WOUND 1/2 X4 (GAUZE/BANDAGES/DRESSINGS)
CLSR STERI-STRIP ANTIMIC 1/2X4 (GAUZE/BANDAGES/DRESSINGS) ×2 IMPLANT
COVER SURGICAL LIGHT HANDLE (MISCELLANEOUS) ×3 IMPLANT
DRAPE INCISE IOBAN 66X45 STRL (DRAPES) ×6 IMPLANT
DRAPE U-SHAPE 47X51 STRL (DRAPES) ×3 IMPLANT
DRAPE X-RAY CASS 24X20 (DRAPES) IMPLANT
DRILL 2.5 (BIT)
DRSG ADAPTIC 3X8 NADH LF (GAUZE/BANDAGES/DRESSINGS) ×3 IMPLANT
DRSG PAD ABDOMINAL 8X10 ST (GAUZE/BANDAGES/DRESSINGS) ×3 IMPLANT
DURAPREP 26ML APPLICATOR (WOUND CARE) ×3 IMPLANT
ELECT BLADE 4.0 EZ CLEAN MEGAD (MISCELLANEOUS) ×3
ELECT NEEDLE TIP 2.8 STRL (NEEDLE) IMPLANT
ELECT REM PT RETURN 9FT ADLT (ELECTROSURGICAL) ×3
ELECTRODE BLDE 4.0 EZ CLN MEGD (MISCELLANEOUS) ×1 IMPLANT
ELECTRODE REM PT RTRN 9FT ADLT (ELECTROSURGICAL) ×1 IMPLANT
GAUZE SPONGE 4X4 12PLY STRL (GAUZE/BANDAGES/DRESSINGS) ×3 IMPLANT
GLOVE BIOGEL PI ORTHO PRO 7.5 (GLOVE) ×2
GLOVE BIOGEL PI ORTHO PRO SZ8 (GLOVE) ×2
GLOVE ORTHO TXT STRL SZ7.5 (GLOVE) ×3 IMPLANT
GLOVE PI ORTHO PRO STRL 7.5 (GLOVE) ×1 IMPLANT
GLOVE PI ORTHO PRO STRL SZ8 (GLOVE) ×1 IMPLANT
GLOVE SURG ORTHO 8.5 STRL (GLOVE) ×3 IMPLANT
GOWN STRL REUS W/ TWL LRG LVL3 (GOWN DISPOSABLE) ×1 IMPLANT
GOWN STRL REUS W/ TWL XL LVL3 (GOWN DISPOSABLE) ×2 IMPLANT
GOWN STRL REUS W/TWL LRG LVL3 (GOWN DISPOSABLE) ×3
GOWN STRL REUS W/TWL XL LVL3 (GOWN DISPOSABLE) ×6
HANDPIECE INTERPULSE COAX TIP (DISPOSABLE)
KIT BASIN OR (CUSTOM PROCEDURE TRAY) ×3 IMPLANT
KIT ROOM TURNOVER OR (KITS) ×3 IMPLANT
MANIFOLD NEPTUNE II (INSTRUMENTS) ×3 IMPLANT
NDL 1/2 CIR MAYO (NEEDLE) ×1 IMPLANT
NDL HYPO 25GX1X1/2 BEV (NEEDLE) ×1 IMPLANT
NEEDLE 1/2 CIR MAYO (NEEDLE) ×3 IMPLANT
NEEDLE HYPO 25GX1X1/2 BEV (NEEDLE) IMPLANT
NS IRRIG 1000ML POUR BTL (IV SOLUTION) ×3 IMPLANT
PACK SHOULDER (CUSTOM PROCEDURE TRAY) ×3 IMPLANT
PAD ARMBOARD 7.5X6 YLW CONV (MISCELLANEOUS) ×6 IMPLANT
PIN GUIDE 1.2 (PIN) IMPLANT
PIN GUIDE GLENOPHERE 1.5MX300M (PIN) IMPLANT
PIN METAGLENE 2.5 (PIN) IMPLANT
SET HNDPC FAN SPRY TIP SCT (DISPOSABLE) IMPLANT
SLING ARM FOAM STRAP LRG (SOFTGOODS) ×3 IMPLANT
SLING ARM LRG ADULT FOAM STRAP (SOFTGOODS) IMPLANT
SLING ARM MED ADULT FOAM STRAP (SOFTGOODS) IMPLANT
SPONGE LAP 18X18 X RAY DECT (DISPOSABLE) ×3 IMPLANT
SPONGE LAP 4X18 X RAY DECT (DISPOSABLE) ×3 IMPLANT
STRIP CLOSURE SKIN 1/2X4 (GAUZE/BANDAGES/DRESSINGS) ×1 IMPLANT
SUCTION FRAZIER TIP 10 FR DISP (SUCTIONS) ×3 IMPLANT
SUT FIBERWIRE #2 38 T-5 BLUE (SUTURE) ×12
SUT MNCRL AB 4-0 PS2 18 (SUTURE) ×3 IMPLANT
SUT VIC AB 2-0 CT1 27 (SUTURE) ×3
SUT VIC AB 2-0 CT1 TAPERPNT 27 (SUTURE) ×1 IMPLANT
SUTURE FIBERWR #2 38 T-5 BLUE (SUTURE) ×2 IMPLANT
SYR CONTROL 10ML LL (SYRINGE) ×3 IMPLANT
TOWEL OR 17X24 6PK STRL BLUE (TOWEL DISPOSABLE) ×3 IMPLANT
TOWEL OR 17X26 10 PK STRL BLUE (TOWEL DISPOSABLE) ×3 IMPLANT
TOWER CARTRIDGE SMART MIX (DISPOSABLE) IMPLANT
TRAY FOLEY CATH 16FRSI W/METER (SET/KITS/TRAYS/PACK) IMPLANT
WATER STERILE IRR 1000ML POUR (IV SOLUTION) ×3 IMPLANT
YANKAUER SUCT BULB TIP NO VENT (SUCTIONS) IMPLANT

## 2014-11-19 NOTE — Plan of Care (Signed)
Problem: Consults Goal: Diagnosis - Shoulder Surgery Total Shoulder Arthroplasty Left     

## 2014-11-19 NOTE — Brief Op Note (Signed)
11/19/2014  3:12 PM  PATIENT:  Caleb Dougherty  81 y.o. male  PRE-OPERATIVE DIAGNOSIS:  left rotator cuff insufficiency and arthropathy  POST-OPERATIVE DIAGNOSIS:  left rotator cuff insufficiency and arthropathy  PROCEDURE:  Procedure(s): LEFT REVERSE SHOULDER ARTHROPLASTY (Left) DePuy Delta Xtend  SURGEON:  Surgeon(s) and Role:    * Netta Cedars, MD - Primary  PHYSICIAN ASSISTANT:   ASSISTANTS: Ventura Bruns, PA-C   ANESTHESIA:   Regional and general  EBL:  Total I/O In: 1500 [I.V.:1500] Out: -   BLOOD ADMINISTERED:none  DRAINS: none   LOCAL MEDICATIONS USED:  MARCAINE     SPECIMEN:  No Specimen  DISPOSITION OF SPECIMEN:  N/A  COUNTS:  YES  TOURNIQUET:  * No tourniquets in log *  DICTATION: .Other Dictation: Dictation Number [id number]  PLAN OF CARE: Admit to inpatient   PATIENT DISPOSITION:  PACU - hemodynamically stable.   Delay start of Pharmacological VTE agent (>24hrs) due to surgical blood loss or risk of bleeding: not applicable

## 2014-11-19 NOTE — Anesthesia Procedure Notes (Addendum)
Anesthesia Regional Block:  Interscalene brachial plexus block  Pre-Anesthetic Checklist: ,, timeout performed, Correct Patient, Correct Site, Correct Laterality, Correct Procedure, Correct Position, site marked, Risks and benefits discussed,  Surgical consent,  Pre-op evaluation,  At surgeon's request and post-op pain management  Laterality: Left  Prep: chloraprep       Needles:  Injection technique: Single-shot  Needle Type: Other     Needle Length: 9cm 9 cm Needle Gauge: 21 and 21 G  Needle insertion depth: 5 cm   Additional Needles:  Procedures: nerve stimulator Interscalene brachial plexus block Narrative:  Start time: 11/19/2014 12:30 PM End time: 11/19/2014 12:40 PM Injection made incrementally with aspirations every 5 mL.  Performed by: Personally  Anesthesiologist: Lillia Abed  Additional Notes: Monitors applied. Patient sedated. Sterile prep and drape,hand hygiene and sterile gloves were used. Needle position confirmed with evoked response at 0.4 mV.Local anesthetic injected incrementally after negative aspiration.Vascular puncture avoided. No complications. The patient tolerated the procedure well.        Procedure Name: Intubation Date/Time: 11/19/2014 1:30 PM Performed by: Adalberto Ill Pre-anesthesia Checklist: Patient identified, Emergency Drugs available, Suction available, Patient being monitored and Timeout performed Patient Re-evaluated:Patient Re-evaluated prior to inductionOxygen Delivery Method: Circle system utilized Preoxygenation: Pre-oxygenation with 100% oxygen Intubation Type: IV induction Ventilation: Mask ventilation without difficulty Laryngoscope Size: Miller and 2 Grade View: Grade I Tube type: Oral Tube size: 7.5 mm Number of attempts: 1 Airway Equipment and Method: Patient positioned with wedge pillow Placement Confirmation: ETT inserted through vocal cords under direct vision,  positive ETCO2,  CO2 detector and breath sounds  checked- equal and bilateral Secured at: 23 cm Tube secured with: Tape Dental Injury: Teeth and Oropharynx as per pre-operative assessment

## 2014-11-19 NOTE — Anesthesia Preprocedure Evaluation (Signed)
Anesthesia Evaluation  Patient identified by MRN, date of birth, ID band Patient awake    Reviewed: Allergy & Precautions, NPO status , Patient's Chart, lab work & pertinent test results  Airway Mallampati: I  TM Distance: >3 FB Neck ROM: Full    Dental   Pulmonary    Pulmonary exam normal       Cardiovascular hypertension, Pt. on medications     Neuro/Psych    GI/Hepatic   Endo/Other  diabetes, Type 2, Oral Hypoglycemic Agents  Renal/GU      Musculoskeletal   Abdominal   Peds  Hematology   Anesthesia Other Findings   Reproductive/Obstetrics                             Anesthesia Physical Anesthesia Plan  ASA: II  Anesthesia Plan: General   Post-op Pain Management:    Induction: Intravenous  Airway Management Planned: Oral ETT  Additional Equipment:   Intra-op Plan:   Post-operative Plan: Extubation in OR  Informed Consent: I have reviewed the patients History and Physical, chart, labs and discussed the procedure including the risks, benefits and alternatives for the proposed anesthesia with the patient or authorized representative who has indicated his/her understanding and acceptance.     Plan Discussed with: CRNA and Surgeon  Anesthesia Plan Comments:         Anesthesia Quick Evaluation

## 2014-11-19 NOTE — Transfer of Care (Signed)
Immediate Anesthesia Transfer of Care Note  Patient: Caleb Dougherty  Procedure(s) Performed: Procedure(s): LEFT REVERSE SHOULDER ARTHROPLASTY (Left)  Patient Location: PACU  Anesthesia Type:General  Level of Consciousness: awake, oriented and patient cooperative  Airway & Oxygen Therapy: Patient Spontanous Breathing and Patient connected to nasal cannula oxygen  Post-op Assessment: Report given to RN and Post -op Vital signs reviewed and stable  Post vital signs: Reviewed  Last Vitals:  Filed Vitals:   11/19/14 1214  BP: 101/83  Pulse: 67  Temp:   Resp: 15    Complications: No apparent anesthesia complications

## 2014-11-19 NOTE — Op Note (Signed)
NAME:  Caleb Dougherty, Caleb Dougherty NO.:  [id number]  MEDICAL RECORD NO.:  [id number]  LOCATION:  5N06C                        FACILITY:  [facility]  PHYSICIAN:  Doran Heater. Veverly Fells, M.D. DATE OF BIRTH:  1929/05/16  DATE OF PROCEDURE:  11/19/2014 DATE OF DISCHARGE:                              OPERATIVE REPORT   PREOPERATIVE DIAGNOSIS:  Left shoulder rotator cuff tear arthropathy.  POSTOPERATIVE DIAGNOSIS:  Left shoulder rotator cuff tear arthropathy.  PROCEDURE PERFORMED:  Left shoulder reverse total shoulder arthroplasty using DePuy Delta Xtend prosthesis.  ATTENDING SURGEON:  Doran Heater. Veverly Fells, MD.  ASSISTANT:  Abbott Pao. Dixon, PA-C, who was scrubbed the entire procedure and necessary for satisfactory completion of surgery.  ANESTHESIA:  General anesthesia was used plus interscalene block.  ESTIMATED BLOOD LOSS:  200 mL.  FLUID REPLACEMENT:  1200 mL crystalloids.  INSTRUMENT COUNTS:  Correct.  COMPLICATIONS:  There were no complications.  ANTIBIOTICS:  Perioperative antibiotics were given.  INDICATIONS:  The patient is an 81 year old male with worsening left shoulder pain and dysfunction secondary to rotator cuff tear arthropathy.  The patient has had progressive functional loss to the point where it is interfering with activities of daily living.  The patient desires operative treatment with reverse shoulder arthroplasty to deal with his severe rotator cuff tear arthropathy.  Risks and benefits of surgery were discussed.  Informed consent was obtained.  DESCRIPTION OF PROCEDURE:  After an adequate level of anesthesia achieved, the patient was positioned in the modified beach-chair position.  Left shoulder was correctly identified and sterilely prepped and draped in usual manner.  Time-out was called.  After, we called our time-out, we entered the shoulder in standard deltopectoral incision starting at the coracoid process extending down to the anterior  humerus. Dissection down to subcutaneous tissues using needle-tip Bovie identified the cephalic vein took it laterally with the deltoid.  The pectoralis taken medially.  Conjoined tendon identified and retracted medially.  Subscapularis taken off subperiosteally off the lesser tuberosity and tagged for repair.  The inferior capsule released off the inferior humeral neck.  Bone-on-bone arthritis was noted.  The rotator cuff was torn.  Biceps was released.  The head was delivered out of the wound and entered using a reamer.  We were able to ream up to a size 12 diameter for the shaft and then we did our epi 1 left, metaphyseal reamer and reamed for the DePuy Delta Xtend prosthesis, we took a 12 body with a size epi 1 left metaphysis set on the 0 setting and I placed a 20 degrees of retroversion.  We impacted that into position.  Once we had that trial prosthesis in place, we then went ahead and retracted the humerus posteriorly.  We did a 360 degree capsulectomy, carefully protected the axillary nerve.  We freed up the subscapularis from the inner side of the coracoid and off the capsule.  We removed the remaining cartilage on the glenoid surface, placed our center guide pin, reamed for the metaglene and drilled out the center peg and impacted the metaglene in position for the Delta Xtend prosthesis.  Once the metaglene was in place, placed a 48 screw inferiorly and locked it  in a 36 into the base of the coracoid and locked it and then 18 anterior- posterior nonlocked.  We had nice stability to the metaglene.  We placed our 42 standard glenoid sphere in place and we were careful to protect the axillary nerve and keep it other way.  We had good clearance inferiorly with that inferior part of the glenoid sphere.  Next, we placed a 42+ 3 trial and had nice snapping fit for the tensioning.  We had nice tension on the conjoint tendon.  No gapping with external rotation.  Negative sulcus.  We  thoroughly irrigated the wound.  We then removed the trial components, placed a #2 FiberWire suture through drill holes in the humerus at the lesser tuberosity and repair of the subscap. We then used impaction grafting technique to insert the hydroxyapatite coated size 12 body epi 1 left metaphyseal modular component, getting 20 degrees of retroversion impacted that in place, had nice stability of that stem.  We then did our 42+ 3 poly and inserted in place impacting that and then reducing the shoulder.  Again careful to make sure the axillary nerve was free and clear.  We then repaired the subscap anatomically to bone and then irrigated thoroughly again.  We are pleased with the excursion and externally rotated to 30 degrees and that did not seem to limit motion.  We repaired deltopectoral interval with 0 Vicryl suture, followed by 2-0 Vicryl subcutaneous closure and 4-0 Monocryl for skin.  Steri-Strips applied followed by sterile dressing. The patient tolerated the surgery well.     Doran Heater. Veverly Fells, M.D.     SRN/MEDQ  D:  11/19/2014  T:  11/19/2014  Job:  [id number]

## 2014-11-19 NOTE — Discharge Instructions (Addendum)
Ice to the shoulder as much as possible.  Keep the elbow propped so that the patient can see his own elbow.  DO NOT PUSH OUT OF A CHAIR  Keep incision covered and dry for one week, then ok to get wet in the shower.  Do exercises 4 times per day.  Wear sling out of the home or while up walking around.. Watch balance!!!!  Follow up in two weeks  [phone]  Change bandage tomorrow to pink gel bandage, then change to new one in three days, turn the square so it is a diamond and it will cover and seal the wound

## 2014-11-19 NOTE — Interval H&P Note (Signed)
History and Physical Interval Note:  11/19/2014 12:35 PM  Caleb Dougherty  has presented today for surgery, with the diagnosis of left rotator cuff insufficency  The various methods of treatment have been discussed with the patient and family. After consideration of risks, benefits and other options for treatment, the patient has consented to  Procedure(s): LEFT REVERSE SHOULDER ARTHROPLASTY (Left) as a surgical intervention .  The patient's history has been reviewed, patient examined, no change in status, stable for surgery.  I have reviewed the patient's chart and labs.  Questions were answered to the patient's satisfaction.     [name],STEVEN R

## 2014-11-19 NOTE — Anesthesia Postprocedure Evaluation (Signed)
Anesthesia Post Note  Patient: Caleb Dougherty  Procedure(s) Performed: Procedure(s) (LRB): LEFT REVERSE SHOULDER ARTHROPLASTY (Left)  Anesthesia type: general  Patient location: PACU  Post pain: Pain level controlled  Post assessment: Patient's Cardiovascular Status Stable  Last Vitals:  Filed Vitals:   11/19/14 1643  BP:   Pulse:   Temp: 36.5 C  Resp:     Post vital signs: Reviewed and stable  Level of consciousness: sedated  Complications: No apparent anesthesia complications

## 2014-11-20 LAB — BASIC METABOLIC PANEL
Anion gap: 8 (ref 5–15)
BUN: 37 mg/dL — ABNORMAL HIGH (ref 6–23)
CALCIUM: 8.2 mg/dL — AB (ref 8.4–10.5)
CO2: 21 mmol/L (ref 19–32)
CREATININE: 1.34 mg/dL (ref 0.50–1.35)
Chloride: 104 mmol/L (ref 96–112)
GFR calc Af Amer: 55 mL/min — ABNORMAL LOW (ref >90)
GFR calc non Af Amer: 47 mL/min — ABNORMAL LOW (ref >90)
GLUCOSE: 364 mg/dL — AB (ref 70–99)
Potassium: 5.1 mmol/L (ref 3.5–5.1)
Sodium: 133 mmol/L — ABNORMAL LOW (ref 135–145)

## 2014-11-20 LAB — GLUCOSE, CAPILLARY
GLUCOSE-CAPILLARY: 324 mg/dL — AB (ref 70–99)
GLUCOSE-CAPILLARY: 300 mg/dL — AB (ref 70–99)
Glucose-Capillary: 382 mg/dL — ABNORMAL HIGH (ref 70–99)

## 2014-11-20 LAB — HEMOGLOBIN AND HEMATOCRIT, BLOOD
HCT: 33 % — ABNORMAL LOW (ref 39.0–52.0)
Hemoglobin: 11.2 g/dL — ABNORMAL LOW (ref 13.0–17.0)

## 2014-11-20 MED ORDER — INSULIN ASPART 100 UNIT/ML ~~LOC~~ SOLN: 0.0000 [IU] | Freq: Every day | SUBCUTANEOUS | Status: DC

## 2014-11-20 MED ORDER — INSULIN ASPART 100 UNIT/ML ~~LOC~~ SOLN
0.0000 [IU] | Freq: Three times a day (TID) | SUBCUTANEOUS | Status: DC
  Administered 2014-11-20: 15 [IU] via SUBCUTANEOUS

## 2014-11-20 MED ORDER — INSULIN ASPART 100 UNIT/ML ~~LOC~~ SOLN
4.0000 [IU] | Freq: Three times a day (TID) | SUBCUTANEOUS | Status: DC
  Administered 2014-11-20: 4 [IU] via SUBCUTANEOUS

## 2014-11-20 NOTE — Evaluation (Signed)
Occupational Therapy Evaluation and Discharge  Patient Details Name: Caleb Dougherty MRN: [id number] DOB: 05-10-1929 Today's Date: 11/20/2014    History of Present Illness Pt is an 81 y.o. Male s/p Left Reverse TSA.    Clinical Impression   PTA pt lived at home and was independent and very active. Pt requires min-mod A for UB ADLs due to decreased ROM, however wife was present for education and will be at home to assist. Pt participated in therapeutic exercises with good return demo. No further acute OT needs. Pt is safe to d/c home.     Follow Up Recommendations  No OT follow up;Supervision - Intermittent    Equipment Recommendations  None recommended by OT    Recommendations for Other Services       Precautions / Restrictions Precautions Precautions: Shoulder Type of Shoulder Precautions: sling for comfort and sleep; NWB, AROM elbow, wrist, hand; AAROM/PROM FF, AB, ER with "ADL focus" Shoulder Interventions: Shoulder sling/immobilizer;For comfort (and sleep) Precaution Booklet Issued: Yes (comment) Precaution Comments: Educated pt and wife on precautions and incorporating into ADLs.  Required Braces or Orthoses: Sling Restrictions Weight Bearing Restrictions: Yes LUE Weight Bearing: Non weight bearing      Mobility Bed Mobility Overal bed mobility: Modified Independent             General bed mobility comments: use of bed rail on right. Educated on bed m obility at home  Transfers Overall transfer level: Modified independent                         ADL Overall ADL's : Needs assistance/impaired Eating/Feeding: Set up;Sitting Eating/Feeding Details (indicate cue type and reason): assist to open containers and cut food Grooming: Set up;Supervision/safety;Standing   Upper Body Bathing: Minimal assitance;Sitting   Lower Body Bathing: Supervison/ safety;Set up;Sit to/from stand   Upper Body Dressing : Moderate assistance;Sitting;With caregiver  independent assisting   Lower Body Dressing: Supervision/safety;Set up;Sit to/from stand   Toilet Transfer: Supervision/safety;Ambulation   Toileting- Clothing Manipulation and Hygiene: Supervision/safety;Sit to/from stand       Functional mobility during ADLs: Supervision/safety       Vision Additional Comments: No change from baseline          Pertinent Vitals/Pain Pain Assessment: No/denies pain     Hand Dominance Right   Extremity/Trunk Assessment Upper Extremity Assessment Upper Extremity Assessment: LUE deficits/detail LUE Deficits / Details: reverse TSA; protocol listed above LUE: Unable to fully assess due to immobilization LUE Coordination: decreased gross motor   Lower Extremity Assessment Lower Extremity Assessment: Overall WFL for tasks assessed   Cervical / Trunk Assessment Cervical / Trunk Assessment: Normal   Communication Communication Communication: No difficulties   Cognition Arousal/Alertness: Awake/alert Behavior During Therapy: WFL for tasks assessed/performed Overall Cognitive Status: Within Functional Limits for tasks assessed                        Exercises Exercises: Shoulder     11/20/14 0800  Shoulder Exercises  Shoulder Flexion AAROM;PROM;Left;10 reps;Supine (with wife assist)  Shoulder ABduction AAROM;PROM;Left;10 reps;Supine  Shoulder External Rotation AAROM;PROM;Left;10 reps;Supine  Elbow Flexion AROM;Left;10 reps;Supine  Elbow Extension AROM;Left;10 reps;Supine  Wrist Flexion AROM;Left;10 reps;Supine  Wrist Extension AROM;Left;10 reps;Supine  Digit Composite Flexion AROM;Left;10 reps;Supine  Composite Extension AROM;Left;10 reps;Supine      Shoulder Instructions Shoulder Instructions Donning/doffing shirt without moving shoulder: Moderate assistance;Caregiver independent with task Method for sponge bathing under operated UE: Minimal assistance  Donning/doffing sling/immobilizer: Moderate assistance;Caregiver  independent with task Correct positioning of sling/immobilizer: Independent ROM for elbow, wrist and digits of operated UE: Independent Sling wearing schedule (on at all times/off for ADL's): Independent Proper positioning of operated UE when showering: Independent Positioning of UE while sleeping: [place] expects to be discharged to:: Private residence Living Arrangements: Spouse/significant other Available Help at Discharge: Family Type of Home: House Home Access: Ramped entrance;Stairs to enter Technical brewer of Steps: 2 Entrance Stairs-Rails: None Home Layout: One level     Bathroom Shower/Tub: Occupational psychologist: [place] - single point;Walker - 2 wheels          Prior Functioning/Environment Level of Independence: Independent        Comments: very active    OT Diagnosis: Generalized weakness;Acute pain    End of Session Equipment Utilized During Treatment: Other (comment) (sling) Nurse Communication: Mobility status  Activity Tolerance: Patient tolerated treatment well Patient left: in chair;with call bell/phone within reach;with family/visitor present   Time: 0832-0904 OT Time Calculation (min): 32 min Charges:  OT General Charges $OT Visit: 1 Procedure OT Evaluation $Initial OT Evaluation Tier I: 1 Procedure OT Treatments $Therapeutic Exercise: 8-22 mins G-Codes:    Caleb Dougherty Dec 19, 2014, 10:44 AM  Caleb Dougherty, OTR/L Occupational Therapist [phone] (pager)

## 2014-11-20 NOTE — Discharge Summary (Signed)
Physician Discharge Summary   Patient ID: Caleb Dougherty MRN: [id number] DOB/AGE: 1928/08/02 81 y.o.  Admit date: 11/19/2014 Discharge date: 11/20/2014  Admission Diagnoses:  Active Problems:   S/P shoulder replacement   Discharge Diagnoses:  Same   Surgeries: Procedure(s): LEFT REVERSE SHOULDER ARTHROPLASTY on 11/19/2014   Consultants: OT  Discharged Condition: Stable  Hospital Course: COLA HIGHFILL is an 81 y.o. male who was admitted 11/19/2014 with a chief complaint of left shoulder pain and loss of function, and found to have a diagnosis of left shoulder rotator cuff tear arthropathy.  They were brought to the operating room on 11/19/2014 and underwent the above named procedures.    The patient had an uncomplicated hospital course and was stable for discharge.  Recent vital signs:  Filed Vitals:   11/20/14 0635  BP: 135/52  Pulse: 67  Temp: 98.4 F (36.9 C)  Resp:     Recent laboratory studies:  Results for orders placed or performed during the hospital encounter of 11/19/14  Glucose, capillary  Result Value Ref Range   Glucose-Capillary 121 (H) 70 - 99 mg/dL  Glucose, capillary  Result Value Ref Range   Glucose-Capillary 142 (H) 70 - 99 mg/dL   Comment 1 Notify RN    Comment 2 Document in Chart   Hemoglobin and hematocrit, blood  Result Value Ref Range   Hemoglobin 11.2 (L) 13.0 - 17.0 g/dL   HCT 33.0 (L) 39.0 - 52.0 %  Glucose, capillary  Result Value Ref Range   Glucose-Capillary 324 (H) 70 - 99 mg/dL  Glucose, capillary  Result Value Ref Range   Glucose-Capillary 300 (H) 70 - 99 mg/dL    Discharge Medications:     Medication List    TAKE these medications        aspirin 325 MG EC tablet  Take 1 tablet (325 mg total) by mouth 2 (two) times daily.     DSS 100 MG Caps  Take 100 mg by mouth 2 (two) times daily.     ferrous sulfate 325 (65 FE) MG tablet  Take 1 tablet (325 mg total) by mouth 3 (three) times daily after meals.     fosinopril 40 MG tablet  Commonly known as:  MONOPRIL  Take 40 mg by mouth every evening.     glyBURIDE 5 MG tablet  Commonly known as:  DIABETA  Take 10 mg by mouth 2 (two) times daily with a meal. Takes 2=10mg      hydrochlorothiazide 25 MG tablet  Commonly known as:  HYDRODIURIL  Take 25 mg by mouth every evening.     insulin glargine 100 UNIT/ML injection  Commonly known as:  LANTUS  Inject 25 Units into the skin every morning.     multivitamin with minerals Tabs tablet  Take 1 tablet by mouth every morning.     naproxen 250 MG tablet  Commonly known as:  NAPROSYN  Take 500 mg by mouth 2 (two) times daily.     oxyCODONE 5 MG immediate release tablet  Commonly known as:  Oxy IR/ROXICODONE  Take 1-3 tablets (5-15 mg total) by mouth every 4 (four) hours.     oxyCODONE 5 MG immediate release tablet  Commonly known as:  ROXICODONE  Take 1 tablet (5 mg total) by mouth every 4 (four) hours as needed for severe pain.     polyethylene glycol packet  Commonly known as:  MIRALAX / GLYCOLAX  Take 17 g by mouth daily as needed for mild constipation.  simvastatin 40 MG tablet  Commonly known as:  ZOCOR  Take 40 mg by mouth every evening.     terazosin 5 MG capsule  Commonly known as:  HYTRIN  Take 5 mg by mouth at bedtime.     tiZANidine 4 MG tablet  Commonly known as:  ZANAFLEX  Take 1 tablet (4 mg total) by mouth every 6 (six) hours as needed for muscle spasms.     tiZANidine 4 MG tablet  Commonly known as:  ZANAFLEX  Take 1 tablet (4 mg total) by mouth every 6 (six) hours as needed for muscle spasms.        Diagnostic Studies: Dg Shoulder Left Port  11/19/2014   CLINICAL DATA:  Status post left shoulder replacement.  EXAM: LEFT SHOULDER - 1 VIEW  COMPARISON:  None.  FINDINGS: The patient has undergone left reverse shoulder arthroplasty. The humeral head component appears normally located over the glenoid component. No acute fracture.  IMPRESSION: 1. Status post  reverse shoulder arthroplasty. 2. No adverse features.   Electronically Signed   By: Nolon Nations M.D.   On: 11/19/2014 16:02    Disposition: home        Follow-up Information    Follow up with Joden Bonsall,STEVEN R, MD. Call in 2 weeks.   Specialty:  Orthopedic Surgery   Why:  [phone]   Contact information:   [address] [place] [address] [phone]        Signed: Augustin Schooling 11/20/2014, 7:51 AM

## 2014-11-20 NOTE — Plan of Care (Signed)
Problem: Consults Goal: Diagnosis - Shoulder Surgery Reverse Total Shoulder Arthroplasty  Left

## 2014-11-20 NOTE — Progress Notes (Signed)
Patient indicates he has a productive cough due to allergies for 2-3 weeks.  Recent coughing episode resulted in blood tinged sputum.

## 2014-11-20 NOTE — Progress Notes (Signed)
Orthopedics Progress Note  Subjective: Patient feels fine this morning and would like to go home today.  Objective:  Filed Vitals:   11/20/14 0635  BP: 135/52  Pulse: 67  Temp: 98.4 F (36.9 C)  Resp:     General: Awake and alert  Musculoskeletal: left shoulder dressing clean and dry and intact. Neurovascularly intact  Lab Results  Component Value Date   WBC 5.5 11/09/2014   HGB 11.2* 11/20/2014   HCT 33.0* 11/20/2014   MCV 89.3 11/09/2014   PLT 178 11/09/2014       Component Value Date/Time   NA 139 11/09/2014 1018   K 4.9 11/09/2014 1018   CL 102 11/09/2014 1018   CO2 26 11/09/2014 1018   GLUCOSE 269* 11/09/2014 1018   BUN 34* 11/09/2014 1018   CREATININE 1.21 11/09/2014 1018   CALCIUM 9.4 11/09/2014 1018   GFRNONAA 54* 11/09/2014 1018   GFRAA 62* 11/09/2014 1018    Lab Results  Component Value Date   INR 1.13 05/21/2014   INR 1.09 02/12/2014    Assessment/Plan: POD #1 s/p Procedure(s): LEFT REVERSE SHOULDER ARTHROPLASTY Stable this morning.  Will need to treat his elevated blood sugar and get therapy in prior to D/C  Doran Heater. Veverly Fells, MD 11/20/2014 7:48 AM

## 2014-11-22 ENCOUNTER — Encounter (HOSPITAL_COMMUNITY): Payer: Self-pay | Admitting: Orthopedic Surgery

## 2014-11-22 NOTE — Progress Notes (Signed)
Utilization review completed.  

## 2014-12-04 ENCOUNTER — Emergency Department: Payer: Medicare PPO

## 2014-12-04 DIAGNOSIS — E119 Type 2 diabetes mellitus without complications: Secondary | ICD-10-CM | POA: Diagnosis not present

## 2014-12-04 DIAGNOSIS — I10 Essential (primary) hypertension: Secondary | ICD-10-CM | POA: Insufficient documentation

## 2014-12-04 DIAGNOSIS — Z79899 Other long term (current) drug therapy: Secondary | ICD-10-CM | POA: Diagnosis not present

## 2014-12-04 DIAGNOSIS — Y92009 Unspecified place in unspecified non-institutional (private) residence as the place of occurrence of the external cause: Secondary | ICD-10-CM | POA: Diagnosis not present

## 2014-12-04 DIAGNOSIS — Z7982 Long term (current) use of aspirin: Secondary | ICD-10-CM | POA: Diagnosis not present

## 2014-12-04 DIAGNOSIS — Y9389 Activity, other specified: Secondary | ICD-10-CM | POA: Insufficient documentation

## 2014-12-04 DIAGNOSIS — Z792 Long term (current) use of antibiotics: Secondary | ICD-10-CM | POA: Insufficient documentation

## 2014-12-04 DIAGNOSIS — Y998 Other external cause status: Secondary | ICD-10-CM | POA: Insufficient documentation

## 2014-12-04 DIAGNOSIS — S60552A Superficial foreign body of left hand, initial encounter: Secondary | ICD-10-CM | POA: Diagnosis present

## 2014-12-04 DIAGNOSIS — Z791 Long term (current) use of non-steroidal anti-inflammatories (NSAID): Secondary | ICD-10-CM | POA: Insufficient documentation

## 2014-12-04 DIAGNOSIS — X58XXXA Exposure to other specified factors, initial encounter: Secondary | ICD-10-CM | POA: Insufficient documentation

## 2014-12-04 DIAGNOSIS — Z794 Long term (current) use of insulin: Secondary | ICD-10-CM | POA: Diagnosis not present

## 2014-12-04 NOTE — ED Notes (Signed)
Pt presents to ER alert and in NAD. pt reports he accidently shot a nail from a nail gun into his left hand, no bleeding noted.

## 2014-12-05 ENCOUNTER — Emergency Department
Admission: EM | Admit: 2014-12-05 | Discharge: 2014-12-05 | Disposition: A | Discharge status: Home / Self Care | Payer: Medicare PPO | Attending: Emergency Medicine | Admitting: Emergency Medicine

## 2014-12-05 DIAGNOSIS — S60552A Superficial foreign body of left hand, initial encounter: Secondary | ICD-10-CM

## 2014-12-05 MED ORDER — LIDOCAINE HCL (PF) 1 % IJ SOLN
INTRAMUSCULAR | Status: AC
  Filled 2014-12-05: qty 5

## 2014-12-05 MED ORDER — CEPHALEXIN 500 MG PO CAPS: 500.0000 mg | ORAL_CAPSULE | Freq: Four times a day (QID) | ORAL | Status: AC

## 2014-12-05 NOTE — Discharge Instructions (Signed)
While we are able to see the nail that was injected into her hand on x-ray and by ultrasound we are unable to remove this foreign body in the emergency department today. This was discussed with Dr. Roland Rack at [facility]. He'll be lead to see you in the office and arrange for removal of this finishing nail, more likely in the operating room. Take Keflex as prescribed. Call Dr. Nicholaus Bloom office for an appointment time. Return to the emergency department if you have redness increased swelling or any other urgent concerns.

## 2014-12-05 NOTE — ED Notes (Signed)
Supplies at bedside for MD; pt waiting patiently for MD assessment and removal of foreign body

## 2014-12-05 NOTE — ED Notes (Signed)
MD at bedside. 

## 2014-12-05 NOTE — ED Provider Notes (Signed)
[facility] Emergency Department Provider Note  ____________________________________________  Time seen: 5:20 AM  I have reviewed the triage vital signs and the nursing notes.   HISTORY  Chief Complaint Foreign Body in Skin  finishing nail injected into left hand    HPI Caleb Dougherty is a 81 y.o. male who was working on a project at home. He picked up a nail gun fires finishing nails but the nail gun discharged firing a finishing nail into his left hand on the ulnar side this past evening.. These finishing nails do not have any head to them. The insertion hole is very small. The patient felt he could feel the nail initially but now with swelling he cannot feel the tip of it. There is no bleeding at this time. The patient is overall comfortable.    Past Medical History  Diagnosis Date  . Wears dentures   . Wears glasses     reading  . Hypertension   . BPH (benign prostatic hypertrophy)   . Cataract     02-12-14 left- ready for surgery, but only has had right done  . Impaired hearing     hard of hearing -no hearing aids  . Diabetes mellitus without complication     diabetes x4 yrs-oral med and Lantus used  . Arthritis     osteoarthritis-knees and feet  . Cancer     melanoma(back)-no futher issues. Skin cancers face "frozen"  . HOH (hard of hearing)     wears bilateral hearing aids  . Urgency of urination     Patient Active Problem List   Diagnosis Date Noted  . 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 DJD (degenerative joint disease) of knee 05/25/2014  . Overweight (BMI 25.0-29.9) 02/24/2014  . S/P left TKA 02/23/2014    Past Surgical History  Procedure Laterality Date  . Colonoscopy    . Cataract extraction Right   . Carpal tunnel release Right 06/23/2013    Procedure: RIGHT CARPAL TUNNEL RELEASE;  Surgeon: Cammie Sickle., MD;  Location: [facility];  Service: Orthopedics;  Laterality: Right;   . Total knee arthroplasty Left 02/23/2014    Procedure: LEFT TOTAL KNEE REPLACEMENT;  Surgeon: Mauri Pole, MD;  Location: [facility];  Service: Orthopedics;  Laterality: Left;  . Total knee arthroplasty Right 05/25/2014    Procedure: RIGHT TOTAL KNEE ARTHROPLASTY;  Surgeon: Mauri Pole, MD;  Location: [facility];  Service: Orthopedics;  Laterality: Right;  . Reverse shoulder arthroplasty Left 11/19/2014    Procedure: LEFT REVERSE SHOULDER ARTHROPLASTY;  Surgeon: Netta Cedars, MD;  Location: [facility];  Service: Orthopedics;  Laterality: Left;    Current Outpatient Rx  Name  Route  Sig  Dispense  Refill  . acetaminophen-codeine (TYLENOL #3) 300-30 MG per tablet   Oral   Take 1 tablet by mouth 3 (three) times a week.         Marland Kitchen aspirin EC 81 MG tablet   Oral   Take 81 mg by mouth daily.         Marland Kitchen aspirin EC 325 MG EC tablet   Oral   Take 1 tablet (325 mg total) by mouth 2 (two) times daily. Patient not taking: Reported on 12/05/2014   60 tablet   0     Take for 4 weeks, then resume regular dose.   . cephALEXin (KEFLEX) 500 MG capsule   Oral   Take 1 capsule (500  mg total) by mouth 4 (four) times daily.   24 capsule   0   . docusate sodium 100 MG CAPS   Oral   Take 100 mg by mouth 2 (two) times daily. Patient not taking: Reported on 11/08/2014   10 capsule   0   . ferrous sulfate 325 (65 FE) MG tablet   Oral   Take 1 tablet (325 mg total) by mouth 3 (three) times daily after meals. Patient not taking: Reported on 11/08/2014      3   . fosinopril (MONOPRIL) 40 MG tablet   Oral   Take 40 mg by mouth every evening.         . glyBURIDE (DIABETA) 5 MG tablet   Oral   Take 10 mg by mouth 2 (two) times daily with a meal. Takes 2=10mg          . hydrochlorothiazide (HYDRODIURIL) 25 MG tablet   Oral   Take 25 mg by mouth every evening.          . insulin glargine (LANTUS) 100 UNIT/ML injection   Subcutaneous   Inject 25 Units into the skin every morning.          .  Multiple Vitamin (MULTIVITAMIN WITH MINERALS) TABS tablet   Oral   Take 1 tablet by mouth every morning.          . naproxen (NAPROSYN) 250 MG tablet   Oral   Take 500 mg by mouth 2 (two) times daily.         Marland Kitchen oxyCODONE (OXY IR/ROXICODONE) 5 MG immediate release tablet   Oral   Take 1-3 tablets (5-15 mg total) by mouth every 4 (four) hours. Patient not taking: Reported on 11/08/2014   120 tablet   0   . oxyCODONE (ROXICODONE) 5 MG immediate release tablet   Oral   Take 1 tablet (5 mg total) by mouth every 4 (four) hours as needed for severe pain. Patient not taking: Reported on 12/05/2014   60 tablet   0   . polyethylene glycol (MIRALAX / GLYCOLAX) packet   Oral   Take 17 g by mouth daily as needed for mild constipation. Patient not taking: Reported on 11/08/2014   14 each   0   . simvastatin (ZOCOR) 40 MG tablet   Oral   Take 40 mg by mouth every evening.         . terazosin (HYTRIN) 5 MG capsule   Oral   Take 5 mg by mouth at bedtime.         Marland Kitchen tiZANidine (ZANAFLEX) 4 MG tablet   Oral   Take 1 tablet (4 mg total) by mouth every 6 (six) hours as needed for muscle spasms. Patient not taking: Reported on 11/08/2014   30 tablet   0   . tiZANidine (ZANAFLEX) 4 MG tablet   Oral   Take 1 tablet (4 mg total) by mouth every 6 (six) hours as needed for muscle spasms. Patient not taking: Reported on 12/05/2014   30 tablet   0     Allergies Review of patient's allergies indicates no known allergies.  No family history on file.  Social History History  Substance Use Topics  . Smoking status: Never Smoker   . Smokeless tobacco: Never Used  . Alcohol Use: No    Review of Systems Constitutional: Negative for fever. ENT: Negative for sore throat. Cardiovascular: Negative for chest pain. Respiratory: Negative for shortness of breath. Gastrointestinal: Negative for  abdominal pain, vomiting and diarrhea. Genitourinary: Negative for dysuria. Musculoskeletal:  Notable for foreign body to the left hand as noted above area and see history of present illness  Skin: Positive for foreign bodies history of present illness Neurological: Negative for headaches   10-point ROS otherwise negative.  ____________________________________________   PHYSICAL EXAM:  VITAL SIGNS: ED Triage Vitals  Enc Vitals Group     BP 12/04/14 2143 162/72 mmHg     Pulse Rate 12/04/14 2143 76     Resp 12/04/14 2143 18     Temp 12/04/14 2143 96.4 F (35.8 C)     Temp Source 12/04/14 2143 Oral     SpO2 12/04/14 2143 96 %     Weight 12/04/14 2143 171 lb (77.565 kg)     Height 12/04/14 2143 5\' 7"  (1.702 m)     Head Cir --      Peak Flow --      Pain Score 12/05/14 0219 0     Pain Loc --      Pain Edu? --      Excl. in [place]? --     Constitutional: Alert and oriented. Well appearing and in no distress. ENT   Head: Normocephalic and atraumatic.   Nose: No congestion/rhinnorhea.   Mouth/Throat: Mucous membranes are moist. Cardiovascular: Normal rate, regular rhythm. Respiratory: Normal respiratory effort without tachypnea. Breath sounds are clear and equal bilaterally. No wheezes/rales/rhonchi. Gastrointestinal: Soft and nontender. No distention.  Musculoskeletal: The patient points to his left hand in the soft at how the ulnar side reporting this is where the finishing nail was injected. There is a very small spot that is no scleral ED insertion point. There is no exit wound. There is minimal swelling to the area. There is no erythema. The patient has good range of motion and function. Neurologic:  Normal speech and language. No gross focal neurologic deficits are appreciated.  Skin:  Skin is warm, dry. No rash noted. Psychiatric: Mood and affect are normal. Speech and behavior are normal.  ____________________________________________  _____________________________________    RADIOLOGY  Foreign body consistent with a metallic finishing nail noted and left  hand.  ____________________________________________   PROCEDURES  Procedure(s) performed: None  Critical Care performed: No  ____________________________________________   INITIAL IMPRESSION / ASSESSMENT AND PLAN / ED COURSE  With the finishing nail stuck in the left hand we discussed options for removal of the foreign body.  Procedure: I cleaned the hand off with Betadine and then attempted to visualize the foreign body with ultrasound. This is fairly easy to do and I could see the orientation of the finishing nail. From an anatomical position, the finishing nail had entered the palmar surface of the hand pointing superior medially. On ultrasound it appeared to be approximately half centimeter below the surface. I infiltrated 1% lidocaine at the insertion point and down to the area of the finishing nail. I then made a small half centimeter incision with a #11 scalpel towards the area of the entrance wound. This is all well tolerated by the patient. I was unable to find the finishing nail. I made multiple attempts including expanding the incision to almost 1 cm. While doing this I thought I had made contact with the finishing nail. I attempted to clamp onto it with a hemostat and I believe unsuccessful with that, however, with the proximal end of the nail still caught in tissue is unable to remove it. I called orthopedics for further guidance and to seek additional  care. I spoke with Dr. Roland Rack who was very helpful. He advised me to close the wound and he'll be glad to see the patient in the office on Monday. Dr. Roland Rack reports that he handles these situations in the operating rooms sometimes with fluoroscopy.  I cleaned the area once again and placed Steri-Strips over the small laceration there was good approximation of the cut tissue.  Due to the foreign body and the location I did place the patient on antibiotics for 7 days.  ____________________________________________   FINAL CLINICAL  IMPRESSION(S) / ED DIAGNOSES  Final diagnoses:  Foreign body in hand, left, initial encounter      Ahmed Prima, MD 12/05/14 [phone]

## 2014-12-07 ENCOUNTER — Ambulatory Visit: Payer: Medicare PPO | Admitting: Certified Registered Nurse Anesthetist

## 2014-12-07 ENCOUNTER — Encounter: Payer: Self-pay | Admitting: *Deleted

## 2014-12-07 ENCOUNTER — Ambulatory Visit
Admission: RE | Admit: 2014-12-07 | Discharge: 2014-12-07 | Disposition: A | Discharge status: Home / Self Care | Payer: Medicare PPO | Source: Ambulatory Visit | Attending: Surgery | Admitting: Surgery

## 2014-12-07 ENCOUNTER — Encounter
Admission: RE | Disposition: A | Discharge status: Home / Self Care | Payer: Self-pay | Source: Ambulatory Visit | Attending: Surgery

## 2014-12-07 DIAGNOSIS — Z823 Family history of stroke: Secondary | ICD-10-CM | POA: Diagnosis not present

## 2014-12-07 DIAGNOSIS — Z7982 Long term (current) use of aspirin: Secondary | ICD-10-CM | POA: Diagnosis not present

## 2014-12-07 DIAGNOSIS — I10 Essential (primary) hypertension: Secondary | ICD-10-CM | POA: Diagnosis not present

## 2014-12-07 DIAGNOSIS — Z9660 Presence of unspecified orthopedic joint implant: Secondary | ICD-10-CM | POA: Diagnosis not present

## 2014-12-07 DIAGNOSIS — Z803 Family history of malignant neoplasm of breast: Secondary | ICD-10-CM | POA: Insufficient documentation

## 2014-12-07 DIAGNOSIS — Z833 Family history of diabetes mellitus: Secondary | ICD-10-CM | POA: Diagnosis not present

## 2014-12-07 DIAGNOSIS — W294XXA Contact with nail gun, initial encounter: Secondary | ICD-10-CM | POA: Insufficient documentation

## 2014-12-07 DIAGNOSIS — Z800 Family history of malignant neoplasm of digestive organs: Secondary | ICD-10-CM | POA: Diagnosis not present

## 2014-12-07 DIAGNOSIS — E785 Hyperlipidemia, unspecified: Secondary | ICD-10-CM | POA: Insufficient documentation

## 2014-12-07 DIAGNOSIS — S61442A Puncture wound with foreign body of left hand, initial encounter: Secondary | ICD-10-CM | POA: Insufficient documentation

## 2014-12-07 DIAGNOSIS — Z794 Long term (current) use of insulin: Secondary | ICD-10-CM | POA: Insufficient documentation

## 2014-12-07 DIAGNOSIS — Z79899 Other long term (current) drug therapy: Secondary | ICD-10-CM | POA: Insufficient documentation

## 2014-12-07 DIAGNOSIS — E118 Type 2 diabetes mellitus with unspecified complications: Secondary | ICD-10-CM | POA: Insufficient documentation

## 2014-12-07 DIAGNOSIS — S61441A Puncture wound with foreign body of right hand, initial encounter: Secondary | ICD-10-CM | POA: Diagnosis present

## 2014-12-07 HISTORY — PX: INCISION AND DRAINAGE WOUND WITH FOREIGN BODY REMOVAL: SHX5635

## 2014-12-07 LAB — GLUCOSE, CAPILLARY: GLUCOSE-CAPILLARY: 86 mg/dL (ref 70–99)

## 2014-12-07 SURGERY — INCISION AND DRAINAGE WOUND WITH FOREIGN BODY REMOVAL
Anesthesia: Regional | Laterality: Left

## 2014-12-07 MED ORDER — PROPOFOL INFUSION 10 MG/ML OPTIME
INTRAVENOUS | Status: DC | PRN
  Administered 2014-12-07: 25 ug/kg/min via INTRAVENOUS

## 2014-12-07 MED ORDER — CEFAZOLIN SODIUM-DEXTROSE 2-3 GM-% IV SOLR
INTRAVENOUS | Status: AC
  Administered 2014-12-07: 2 g via INTRAVENOUS
  Filled 2014-12-07: qty 50

## 2014-12-07 MED ORDER — LIDOCAINE HCL (PF) 1 % IJ SOLN
INTRAMUSCULAR | Status: AC
  Filled 2014-12-07: qty 30

## 2014-12-07 MED ORDER — ONDANSETRON HCL 4 MG/2ML IJ SOLN: 4.0000 mg | Freq: Four times a day (QID) | INTRAMUSCULAR | Status: DC | PRN

## 2014-12-07 MED ORDER — FENTANYL CITRATE (PF) 100 MCG/2ML IJ SOLN: 25.0000 ug | INTRAMUSCULAR | Status: DC | PRN

## 2014-12-07 MED ORDER — POTASSIUM CHLORIDE IN NACL 20-0.9 MEQ/L-% IV SOLN: INTRAVENOUS | Status: DC

## 2014-12-07 MED ORDER — ONDANSETRON HCL 4 MG/2ML IJ SOLN: 4.0000 mg | Freq: Once | INTRAMUSCULAR | Status: DC | PRN

## 2014-12-07 MED ORDER — SODIUM CHLORIDE 0.9 % IV SOLN
INTRAVENOUS | Status: DC
  Administered 2014-12-07: 12:00:00 via INTRAVENOUS

## 2014-12-07 MED ORDER — BUPIVACAINE HCL (PF) 0.5 % IJ SOLN
INTRAMUSCULAR | Status: AC
  Filled 2014-12-07: qty 30

## 2014-12-07 MED ORDER — METOCLOPRAMIDE HCL 10 MG PO TABS: 5.0000 mg | ORAL_TABLET | Freq: Three times a day (TID) | ORAL | Status: DC | PRN

## 2014-12-07 MED ORDER — LIDOCAINE HCL (PF) 0.5 % IJ SOLN
INTRAMUSCULAR | Status: AC
  Filled 2014-12-07: qty 50

## 2014-12-07 MED ORDER — CEFAZOLIN SODIUM-DEXTROSE 2-3 GM-% IV SOLR: 2.0000 g | Freq: Once | INTRAVENOUS | Status: DC

## 2014-12-07 MED ORDER — METOCLOPRAMIDE HCL 5 MG/ML IJ SOLN: 5.0000 mg | Freq: Three times a day (TID) | INTRAMUSCULAR | Status: DC | PRN

## 2014-12-07 MED ORDER — ONDANSETRON HCL 4 MG PO TABS: 4.0000 mg | ORAL_TABLET | Freq: Four times a day (QID) | ORAL | Status: DC | PRN

## 2014-12-07 MED ORDER — FENTANYL CITRATE (PF) 100 MCG/2ML IJ SOLN
INTRAMUSCULAR | Status: DC | PRN
  Administered 2014-12-07: 50 ug via INTRAVENOUS

## 2014-12-07 MED ORDER — BUPIVACAINE HCL (PF) 0.5 % IJ SOLN
INTRAMUSCULAR | Status: DC | PRN
  Administered 2014-12-07: 6 mL

## 2014-12-07 MED ORDER — OXYCODONE HCL 5 MG PO TABS: 5.0000 mg | ORAL_TABLET | ORAL | Status: DC | PRN

## 2014-12-07 SURGICAL SUPPLY — 23 items
BANDAGE ELASTIC 2 CLIP ST LF (GAUZE/BANDAGES/DRESSINGS) ×2 IMPLANT
BNDG ESMARK 4X12 TAN STRL LF (GAUZE/BANDAGES/DRESSINGS) ×3 IMPLANT
CANISTER SUCT 1200ML W/VALVE (MISCELLANEOUS) ×3 IMPLANT
CHLORAPREP W/TINT 26ML (MISCELLANEOUS) ×3 IMPLANT
CORD BIP STRL DISP 12FT (MISCELLANEOUS) ×3 IMPLANT
FORCEPS JEWEL BIP 4-3/4 STR (INSTRUMENTS) ×3 IMPLANT
GAUZE PETRO XEROFOAM 1X8 (MISCELLANEOUS) ×3 IMPLANT
GAUZE SPONGE 4X4 12PLY STRL (GAUZE/BANDAGES/DRESSINGS) ×3 IMPLANT
GAUZE SPONGES 4X4 ×3 IMPLANT
GLOVE BIO SURGEON STRL SZ8 (GLOVE) ×6 IMPLANT
GLOVE INDICATOR 8.0 STRL GRN (GLOVE) ×3 IMPLANT
GOWN STRL REUS W/ TWL LRG LVL3 (GOWN DISPOSABLE) ×1 IMPLANT
GOWN STRL REUS W/ TWL XL LVL3 (GOWN DISPOSABLE) ×1 IMPLANT
GOWN STRL REUS W/TWL LRG LVL3 (GOWN DISPOSABLE) ×3
GOWN STRL REUS W/TWL XL LVL3 (GOWN DISPOSABLE) ×3
NS IRRIG 500ML POUR BTL (IV SOLUTION) ×3 IMPLANT
PACK EXTREMITY ARMC (MISCELLANEOUS) ×3 IMPLANT
PAD CAST CTTN 4X4 STRL (SOFTGOODS) ×1 IMPLANT
PADDING CAST COTTON 4X4 STRL (SOFTGOODS) ×3
STOCKINETTE 48X4 2 PLY STRL (GAUZE/BANDAGES/DRESSINGS) ×1 IMPLANT
STOCKINETTE STRL 4IN 9604848 (GAUZE/BANDAGES/DRESSINGS) ×3 IMPLANT
STRAP SAFETY BODY (MISCELLANEOUS) ×3 IMPLANT
SUT PROLENE 4 0 PS 2 18 (SUTURE) ×3 IMPLANT

## 2014-12-07 NOTE — Discharge Instructions (Addendum)
Keep dressing dry and intact. Keep hand elevated above heart level. Apply ice to affected area frequently. Return for follow-up in 10-14 days or as scheduled.General Anesthesia, Care After Refer to this sheet in the next few weeks. These instructions provide you with information on caring for yourself after your procedure. Your health care provider may also give you more specific instructions. Your treatment has been planned according to current medical practices, but problems sometimes occur. Call your health care provider if you have any problems or questions after your procedure. WHAT TO EXPECT AFTER THE PROCEDURE After the procedure, it is typical to experience:  Sleepiness.  Nausea and vomiting. HOME CARE INSTRUCTIONS  For the first 24 hours after general anesthesia:  Have a responsible person with you.  Do not drive a car. If you are alone, do not take public transportation.  Do not drink alcohol.  Do not take medicine that has not been prescribed by your health care provider.  Do not sign important papers or make important decisions.  You may resume a normal diet and activities as directed by your health care provider.  Change bandages (dressings) as directed.  If you have questions or problems that seem related to general anesthesia, call the hospital and ask for the anesthetist or anesthesiologist on call. SEEK MEDICAL CARE IF:  You have nausea and vomiting that continue the day after anesthesia.  You develop a rash. SEEK IMMEDIATE MEDICAL CARE IF:   You have difficulty breathing.  You have chest pain.  You have any allergic problems. Document Released: 10/22/2000 Document Revised: 07/21/2013 Document Reviewed: 01/29/2013 [facility] Patient Information 2015 [place], [place]. This information is not intended to replace advice given to you by your health care provider. Make sure you discuss any questions you have with your health care provider.

## 2014-12-07 NOTE — Op Note (Signed)
12/07/2014  2:33 PM  Patient:   Caleb Dougherty  Pre-Op Diagnosis:   Retained foreign body left hand.  Post-Op Diagnosis:   Same.  Procedure:   Wound exploration with excision of retained foreign body left hand.  Surgeon:   Pascal Lux, MD  Assistant:   None  Anesthesia:   Bier block  Findings:   As above. The nail did not penetrate the muscle belly of the hypothenar eminence.  Complications:   None  EBL:   1 cc  Fluids:   500 cc crystalloid  TT:   31 minutes at 300 mmHg  Drains:   None  Closure:   4-0 Prolene interrupted sutures  Implants:   None  Brief Clinical Note:   The patient is an 81 year old male who was trying to build a picture frame at home when a nail gun accidentally drove a finishing nail into the left hyperthenar eminence of his left hand. An attempt was made to have it removed by the ER physician in the emergency room 2 days ago, but this was unsuccessful. He presents at this time for expiration of the wound and removal of the retained foreign body.  Procedure:   The patient was brought into the operating room and lain in the supine position. After adequate IV sedation was achieved, a Bier block was placed by the anesthesiologist and the tourniquet inflated 300 mmHg. The left hand and upper extremity were prepped with ChloraPrep solution before being draped sterilely. Preoperative antibiotics were administered. The position of the retained foreign body was identified using Orthoscan imaging. A 1.5 cm incision was made over the nail in line with its course while incorporating the stab incision made by the ER physician. The incision was carried down through the subcutaneous tissues. After careful probing and further use of the Orthoscan device in AP and oblique projections, the metallic finishing nail was identified and retrieved. The wound was copiously irrigated with sterile saline solution before the wound was closed using 4-0 Prolene interrupted sutures. A  total of 6 cc of 0.5% plain bupivacaine was injected in and around the incision site to help with postoperative analgesia before a sterile bulky dressing was applied to the hand. The patient was then awakened and returned to the recovery room in satisfactory condition after tolerating the procedure well.

## 2014-12-07 NOTE — Anesthesia Preprocedure Evaluation (Addendum)
Anesthesia Evaluation  Patient identified by MRN, date of birth, ID band Patient awake    Reviewed: Allergy & Precautions, NPO status , Patient's Chart, lab work & pertinent test results  Airway Mallampati: II   Neck ROM: Full    Dental   Pulmonary neg pulmonary ROS,  breath sounds clear to auscultation  Pulmonary exam normal       Cardiovascular hypertension, Rhythm:Regular Rate:Normal     Neuro/Psych negative neurological ROS  negative psych ROS   GI/Hepatic negative GI ROS, Neg liver ROS,   Endo/Other  diabetes, Well Controlled, Type 2  Renal/GU negative Renal ROS Bladder dysfunction  Urgency of urination and BPH    Musculoskeletal  (+) Arthritis -, Osteoarthritis,    Abdominal Normal abdominal exam  (+)   Peds negative pediatric ROS (+)  Hematology negative hematology ROS (+)   Anesthesia Other Findings   Reproductive/Obstetrics                            Anesthesia Physical Anesthesia Plan  ASA: III  Anesthesia Plan: Bier Block   Post-op Pain Management:    Induction:   Airway Management Planned: Nasal Cannula  Additional Equipment:   Intra-op Plan:   Post-operative Plan: Extubation in OR  Informed Consent: I have reviewed the patients History and Physical, chart, labs and discussed the procedure including the risks, benefits and alternatives for the proposed anesthesia with the patient or authorized representative who has indicated his/her understanding and acceptance.     Plan Discussed with: CRNA and Surgeon  Anesthesia Plan Comments:        Anesthesia Quick Evaluation

## 2014-12-07 NOTE — Anesthesia Postprocedure Evaluation (Signed)
  Anesthesia Post-op Note  Patient: Caleb Dougherty  Procedure(s) Performed: Procedure(s): INCISION AND DRAINAGE WOUND WITH FOREIGN BODY REMOVAL (Left)  Anesthesia type:Bier Block  Patient location: PACU  Post pain: Pain level controlled  Post assessment: Post-op Vital signs reviewed, Patient's Cardiovascular Status Stable, Respiratory Function Stable, Patent Airway and No signs of Nausea or vomiting  Post vital signs: Reviewed and stable  Last Vitals:  Filed Vitals:   12/07/14 1604  BP: 135/52  Pulse: 59  Temp:   Resp: 16    Level of consciousness: awake, alert  and patient cooperative  Complications: No apparent anesthesia complications

## 2014-12-07 NOTE — H&P (Signed)
Paper H&P to be scanned into permanent record. H&P reviewed. No changes. 

## 2014-12-07 NOTE — Anesthesia Procedure Notes (Signed)
Procedure Name: MAC Performed by: Demetrius Charity Pre-anesthesia Checklist: Patient identified, Suction available, Patient being monitored, Timeout performed and Emergency Drugs available Oxygen Delivery Method: Simple face mask

## 2014-12-07 NOTE — Transfer of Care (Signed)
Immediate Anesthesia Transfer of Care Note  Patient: Caleb Dougherty  Procedure(s) Performed: Procedure(s): INCISION AND DRAINAGE WOUND WITH FOREIGN BODY REMOVAL (Left)  Patient Location: PACU  Anesthesia Type:MAC and Bier block  Level of Consciousness: awake, alert  and oriented  Airway & Oxygen Therapy: Patient Spontanous Breathing and Patient connected to face mask oxygen  Post-op Assessment: Report given to RN, Post -op Vital signs reviewed and stable and Patient moving all extremities X 4  Post vital signs: Reviewed and stable  Last Vitals:  Filed Vitals:   12/07/14 1443  BP:   Pulse: 59  Temp:   Resp: 8    Complications: No apparent anesthesia complications

## 2014-12-09 ENCOUNTER — Encounter: Payer: Self-pay | Admitting: Surgery

## 2015-10-13 IMAGING — CR DG HAND COMPLETE 3+V*L*
1 series · 3 of 3 positions shown · non-contrast
Comparison: None.

CLINICAL DATA: Accident with pneumatic Domenech with foreign body

EXAM:
LEFT HAND - COMPLETE 3+ VIEW

[Series 1: dg hand complete left · 0.14mm/px · 3 of 3 slices shown]
[im 1/3]
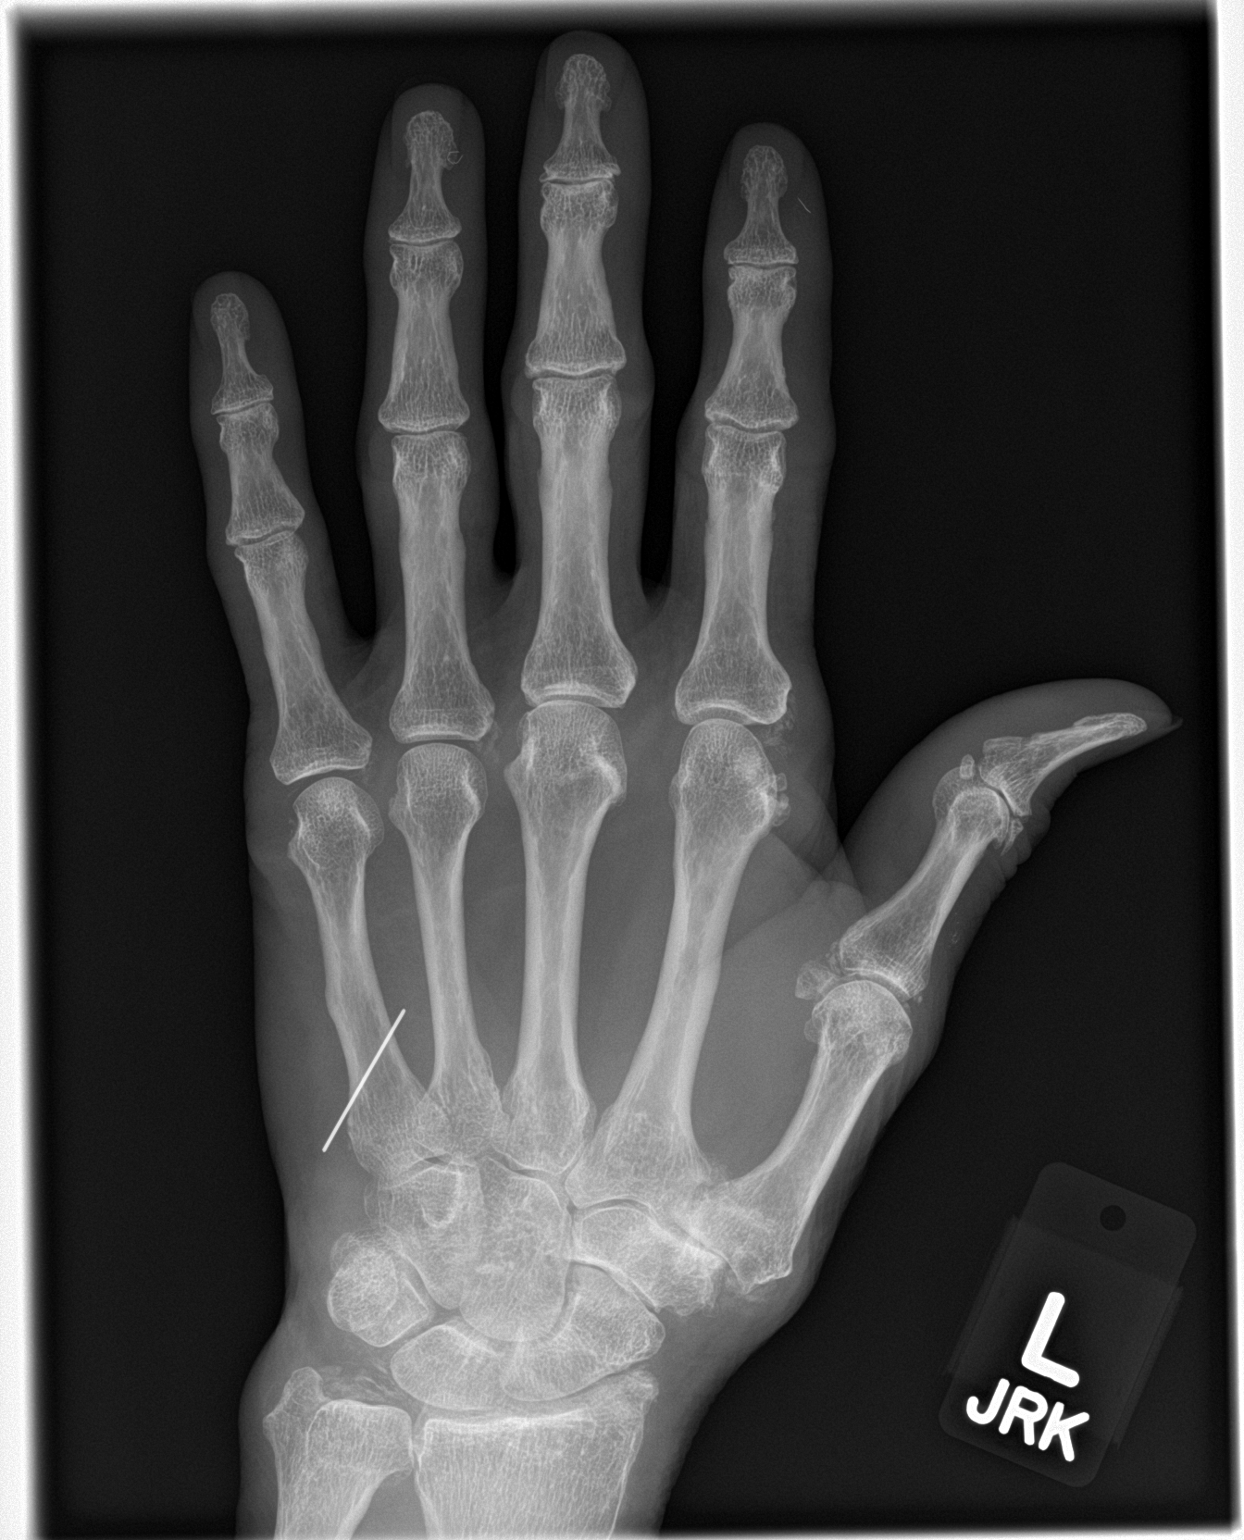
[im 2/3]
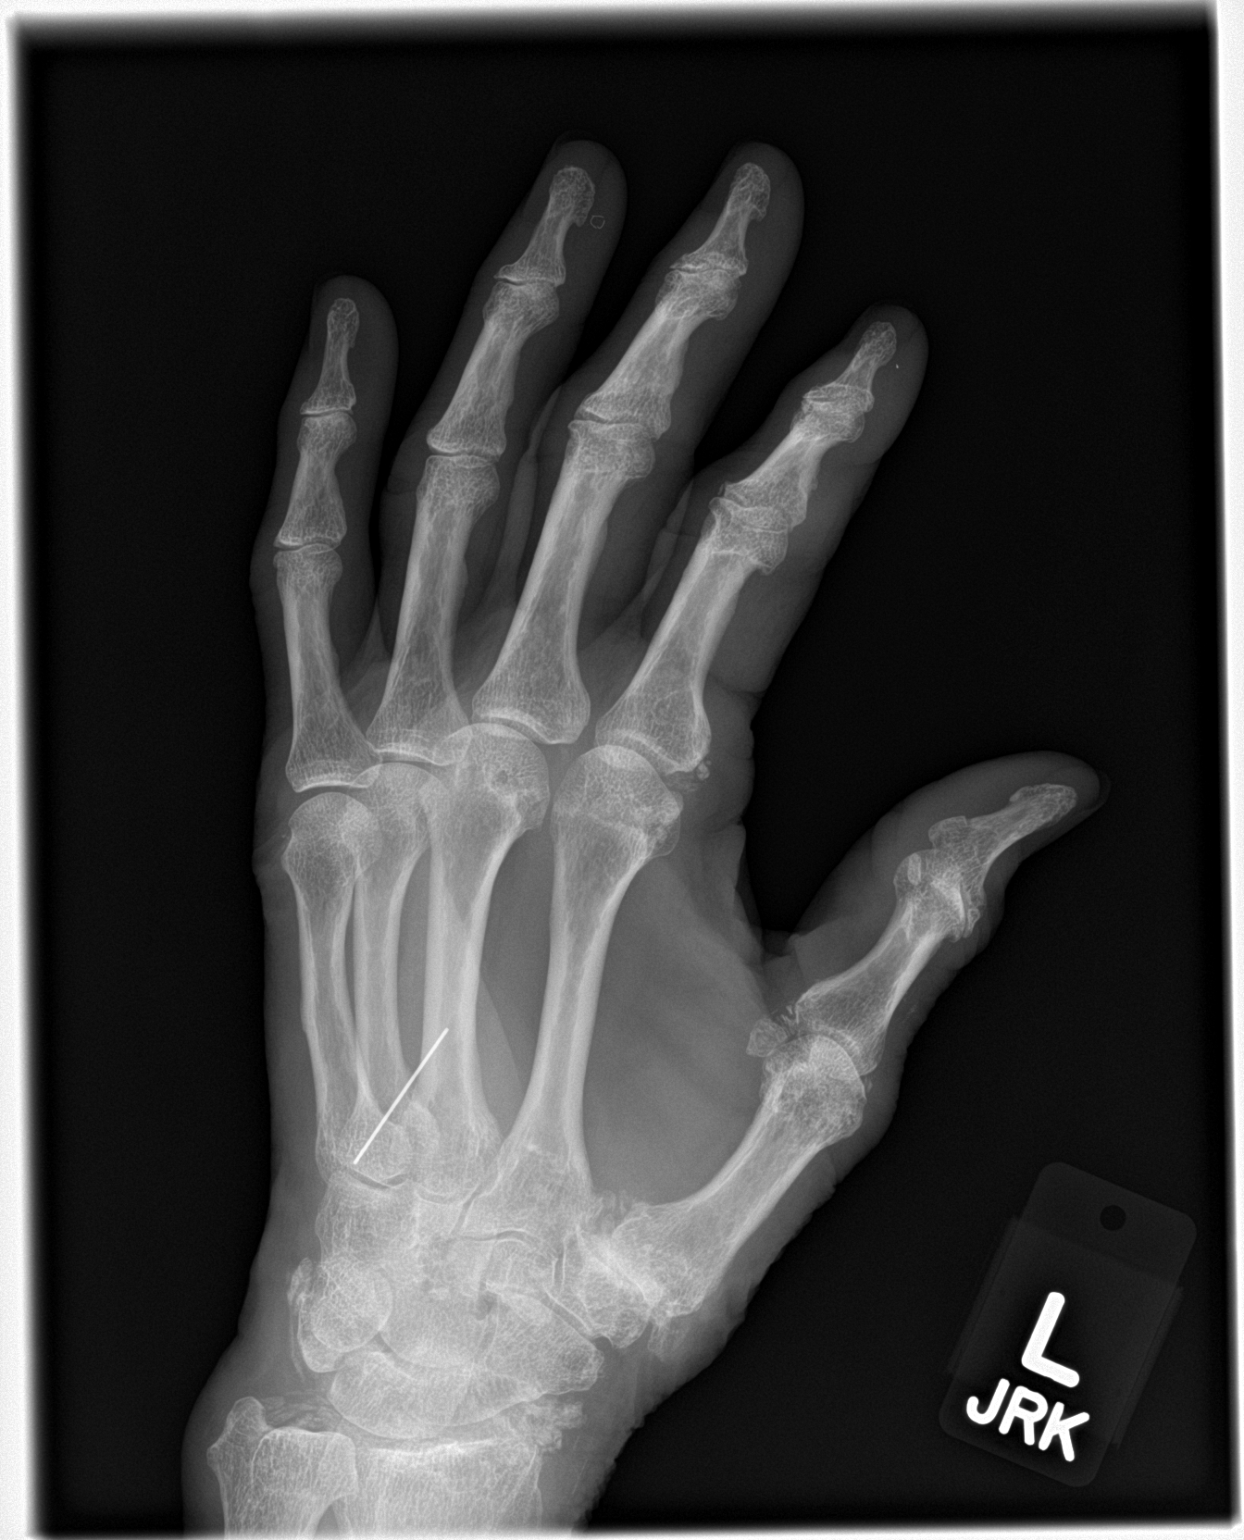
[im 3/3]
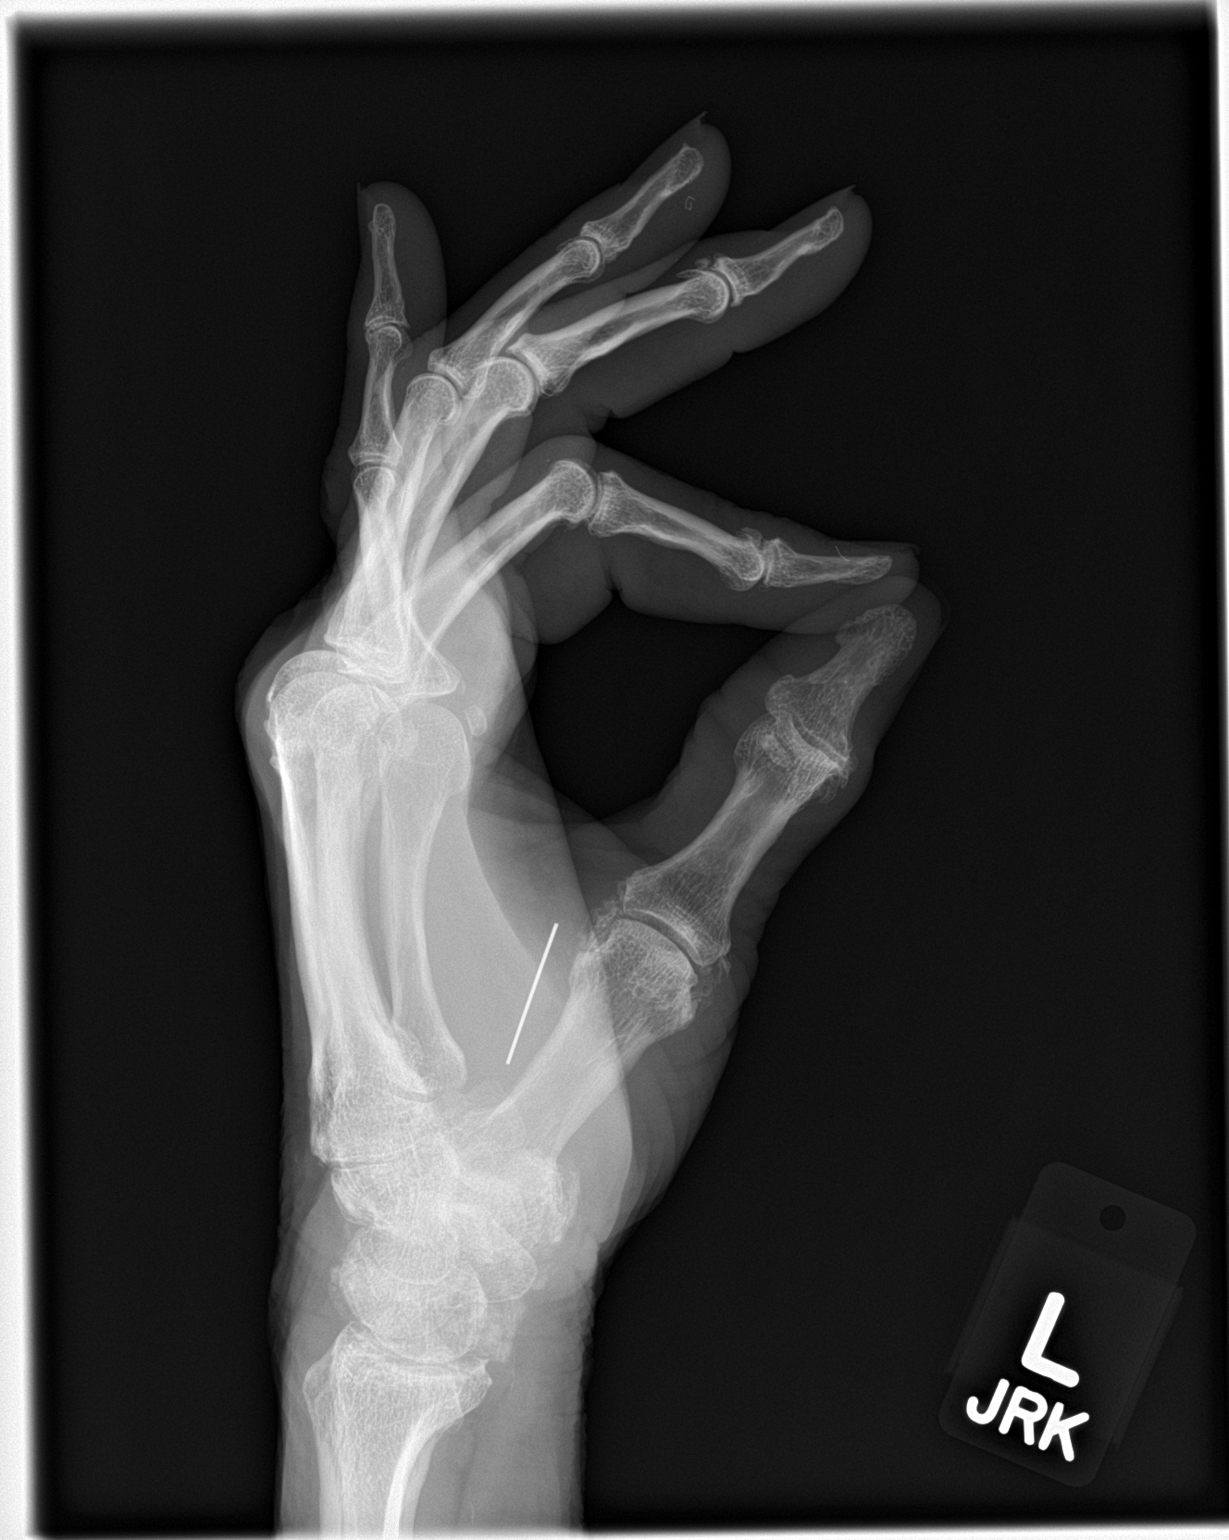

[3 of 3 positions shown; findings below may reference images not displayed]

FINDINGS: No acute fracture or dislocation is noted. A linear metallic foreign
body is noted in the soft tissues of the hand adjacent to the fifth
metacarpal anteriorly. Degenerative changes in the carpal bones as
well as the interphalangeal joints are seen. No other focal
abnormality is noted.
IMPRESSION: Radiopaque foreign body as described.

## 2019-03-24 ENCOUNTER — Other Ambulatory Visit: Payer: Self-pay

## 2019-03-24 ENCOUNTER — Encounter: Payer: Self-pay | Admitting: *Deleted

## 2019-03-26 ENCOUNTER — Other Ambulatory Visit: Payer: Self-pay

## 2019-03-26 ENCOUNTER — Other Ambulatory Visit
Admission: RE | Admit: 2019-03-26 | Discharge: 2019-03-26 | Disposition: A | Discharge status: Home / Self Care | Payer: Medicare Other | Source: Ambulatory Visit | Attending: Ophthalmology | Admitting: Ophthalmology

## 2019-03-26 DIAGNOSIS — Z01812 Encounter for preprocedural laboratory examination: Secondary | ICD-10-CM | POA: Insufficient documentation

## 2019-03-26 DIAGNOSIS — Z20828 Contact with and (suspected) exposure to other viral communicable diseases: Secondary | ICD-10-CM | POA: Diagnosis not present

## 2019-03-26 LAB — SARS CORONAVIRUS 2 (TAT 6-24 HRS): SARS Coronavirus 2: NEGATIVE

## 2019-03-26 NOTE — Discharge Instructions (Signed)

## 2019-03-29 NOTE — Anesthesia Preprocedure Evaluation (Addendum)
Anesthesia Evaluation  Patient identified by MRN, date of birth, ID band Patient awake    Reviewed: Allergy & Precautions, NPO status , Patient's Chart, lab work & pertinent test results, reviewed documented beta blocker date and time   History of Anesthesia Complications Negative for: history of anesthetic complications  Airway Mallampati: III  TM Distance: >3 FB Neck ROM: Limited    Dental  (+) Lower Dentures   Pulmonary    breath sounds clear to auscultation       Cardiovascular Exercise Tolerance: Good METS (4): 3 - Mets hypertension, (-) angina Rhythm:Regular Rate:Normal     Neuro/Psych    GI/Hepatic neg GERD  ,  Endo/Other  diabetes  Renal/GU      Musculoskeletal  (+) Arthritis ,   Abdominal   Peds  Hematology  (+) anemia ,   Anesthesia Other Findings   Reproductive/Obstetrics                            Anesthesia Physical Anesthesia Plan  ASA: III  Anesthesia Plan: MAC   Post-op Pain Management:    Induction: Intravenous  PONV Risk Score and Plan:   Airway Management Planned: Nasal Cannula  Additional Equipment:   Intra-op Plan:   Post-operative Plan:   Informed Consent: I have reviewed the patients History and Physical, chart, labs and discussed the procedure including the risks, benefits and alternatives for the proposed anesthesia with the patient or authorized representative who has indicated his/her understanding and acceptance.       Plan Discussed with: CRNA and Anesthesiologist  Anesthesia Plan Comments:        Anesthesia Quick Evaluation   Active Ambulatory Problems    Diagnosis Date Noted  . S/P left TKA 02/23/2014  . Overweight (BMI 25.0-29.9) 02/24/2014  . 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 DJD (degenerative joint disease) of knee 05/25/2014  . 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   Resolved Ambulatory Problems    Diagnosis Date Noted  .  Postoperative anemia due to acute blood loss 02/24/2014   Past Medical History:  Diagnosis Date  . Arthritis   . BPH (benign prostatic hypertrophy)   . Cancer ([place])   . Cataract   . Diabetes mellitus without complication ([place])   . HOH (hard of hearing)   . Hypertension   . Impaired hearing   . Urgency of urination   . Wears dentures   . Wears glasses     CBC    Component Value Date/Time   WBC 5.5 11/09/2014 1019   RBC 4.58 11/09/2014 1019   HGB 11.2 (L) 11/20/2014 0546   HCT 33.0 (L) 11/20/2014 0546   PLT 178 11/09/2014 1019   MCV 89.3 11/09/2014 1019   MCH 29.9 11/09/2014 1019   MCHC 33.5 11/09/2014 1019   RDW 12.8 11/09/2014 1019    CMP     Component Value Date/Time   NA 133 (L) 11/20/2014 0546   K 5.1 11/20/2014 0546   CL 104 11/20/2014 0546   CO2 21 11/20/2014 0546   GLUCOSE 364 (H) 11/20/2014 0546   BUN 37 (H) 11/20/2014 0546   CREATININE 1.34 11/20/2014 0546   CALCIUM 8.2 (L) 11/20/2014 0546   GFRNONAA 47 (L) 11/20/2014 0546   GFRAA 55 (L) 11/20/2014 0546    COAGS    Component Value Date/Time   INR 1.13 05/21/2014 0920    I have seen and consented the patient, BACH ROCCHI. I have answered all  of his questions regarding anesthesia. he is appropriately NPO.   Josephina Shih, MD Anesthesia

## 2019-03-30 ENCOUNTER — Ambulatory Visit
Admission: RE | Admit: 2019-03-30 | Discharge: 2019-03-30 | Disposition: A | Discharge status: Home / Self Care | Payer: Medicare Other | Source: Ambulatory Visit | Attending: Ophthalmology | Admitting: Ophthalmology

## 2019-03-30 ENCOUNTER — Encounter
Admission: RE | Disposition: A | Discharge status: Home / Self Care | Payer: Self-pay | Source: Ambulatory Visit | Attending: Ophthalmology

## 2019-03-30 ENCOUNTER — Ambulatory Visit: Payer: Medicare Other | Admitting: Anesthesiology

## 2019-03-30 DIAGNOSIS — H2512 Age-related nuclear cataract, left eye: Secondary | ICD-10-CM | POA: Diagnosis present

## 2019-03-30 DIAGNOSIS — Z85828 Personal history of other malignant neoplasm of skin: Secondary | ICD-10-CM | POA: Diagnosis not present

## 2019-03-30 DIAGNOSIS — Z79899 Other long term (current) drug therapy: Secondary | ICD-10-CM | POA: Diagnosis not present

## 2019-03-30 DIAGNOSIS — Z96659 Presence of unspecified artificial knee joint: Secondary | ICD-10-CM | POA: Diagnosis not present

## 2019-03-30 DIAGNOSIS — Z794 Long term (current) use of insulin: Secondary | ICD-10-CM | POA: Insufficient documentation

## 2019-03-30 DIAGNOSIS — I10 Essential (primary) hypertension: Secondary | ICD-10-CM | POA: Diagnosis not present

## 2019-03-30 DIAGNOSIS — Z7982 Long term (current) use of aspirin: Secondary | ICD-10-CM | POA: Insufficient documentation

## 2019-03-30 DIAGNOSIS — E119 Type 2 diabetes mellitus without complications: Secondary | ICD-10-CM | POA: Insufficient documentation

## 2019-03-30 HISTORY — PX: CATARACT EXTRACTION W/PHACO: SHX586

## 2019-03-30 LAB — GLUCOSE, CAPILLARY
Glucose-Capillary: 146 mg/dL — ABNORMAL HIGH (ref 70–99)
Glucose-Capillary: 147 mg/dL — ABNORMAL HIGH (ref 70–99)

## 2019-03-30 SURGERY — PHACOEMULSIFICATION, CATARACT, WITH IOL INSERTION
Anesthesia: Monitor Anesthesia Care | Site: Eye | Laterality: Left

## 2019-03-30 MED ORDER — SODIUM HYALURONATE 10 MG/ML IO SOLN
INTRAOCULAR | Status: DC | PRN
  Administered 2019-03-30: 0.55 mL via INTRAOCULAR

## 2019-03-30 MED ORDER — MIDAZOLAM HCL 2 MG/2ML IJ SOLN
INTRAMUSCULAR | Status: DC | PRN
  Administered 2019-03-30: 1 mg via INTRAVENOUS

## 2019-03-30 MED ORDER — FENTANYL CITRATE (PF) 100 MCG/2ML IJ SOLN
INTRAMUSCULAR | Status: DC | PRN
  Administered 2019-03-30: 50 ug via INTRAVENOUS

## 2019-03-30 MED ORDER — LIDOCAINE HCL (PF) 2 % IJ SOLN
INTRAOCULAR | Status: DC | PRN
  Administered 2019-03-30: 08:00:00 1 mL via INTRAOCULAR

## 2019-03-30 MED ORDER — ARMC OPHTHALMIC DILATING DROPS
1.0000 "application " | OPHTHALMIC | Status: DC | PRN
  Administered 2019-03-30 (×3): 1 via OPHTHALMIC

## 2019-03-30 MED ORDER — TETRACAINE HCL 0.5 % OP SOLN
1.0000 [drp] | OPHTHALMIC | Status: DC | PRN
  Administered 2019-03-30 (×3): 1 [drp] via OPHTHALMIC

## 2019-03-30 MED ORDER — MOXIFLOXACIN HCL 0.5 % OP SOLN
OPHTHALMIC | Status: DC | PRN
  Administered 2019-03-30: 0.2 mL via OPHTHALMIC

## 2019-03-30 MED ORDER — SODIUM HYALURONATE 23 MG/ML IO SOLN
INTRAOCULAR | Status: DC | PRN
  Administered 2019-03-30: 0.6 mL via INTRAOCULAR

## 2019-03-30 MED ORDER — EPINEPHRINE PF 1 MG/ML IJ SOLN
INTRAOCULAR | Status: DC | PRN
  Administered 2019-03-30: 123 mL via OPHTHALMIC

## 2019-03-30 SURGICAL SUPPLY — 19 items
CANNULA ANT/CHMB 27G (MISCELLANEOUS) ×2 IMPLANT
CANNULA ANT/CHMB 27GA (MISCELLANEOUS) ×6 IMPLANT
DISSECTOR HYDRO NUCLEUS 50X22 (MISCELLANEOUS) ×3 IMPLANT
GLOVE SURG LX 7.5 STRW (GLOVE) ×2
GLOVE SURG LX STRL 7.5 STRW (GLOVE) ×1 IMPLANT
GLOVE SURG SYN 8.5  E (GLOVE) ×2
GLOVE SURG SYN 8.5 E (GLOVE) ×1 IMPLANT
GLOVE SURG SYN 8.5 PF PI (GLOVE) ×1 IMPLANT
GOWN STRL REUS W/ TWL LRG LVL3 (GOWN DISPOSABLE) ×2 IMPLANT
GOWN STRL REUS W/TWL LRG LVL3 (GOWN DISPOSABLE) ×6
LENS IOL TECNIS ITEC 21.5 (Intraocular Lens) ×2 IMPLANT
MARKER SKIN DUAL TIP RULER LAB (MISCELLANEOUS) ×3 IMPLANT
PACK DR. KING ARMS (PACKS) ×3 IMPLANT
PACK EYE AFTER SURG (MISCELLANEOUS) ×3 IMPLANT
PACK OPTHALMIC (MISCELLANEOUS) ×3 IMPLANT
SYR 3ML LL SCALE MARK (SYRINGE) ×3 IMPLANT
SYR TB 1ML LUER SLIP (SYRINGE) ×3 IMPLANT
WATER STERILE IRR 250ML POUR (IV SOLUTION) ×3 IMPLANT
WIPE NON LINTING 3.25X3.25 (MISCELLANEOUS) ×3 IMPLANT

## 2019-03-30 NOTE — Transfer of Care (Signed)
Immediate Anesthesia Transfer of Care Note  Patient: Caleb Dougherty  Procedure(s) Performed: CATARACT EXTRACTION PHACO AND INTRAOCULAR LENS PLACEMENT (IOC) LEFT DIABETES  02:21.8  19.2%  28.18 (Left Eye)  Patient Location: PACU  Anesthesia Type: MAC  Level of Consciousness: awake, alert  and patient cooperative  Airway and Oxygen Therapy: Patient Spontanous Breathing and Patient connected to supplemental oxygen  Post-op Assessment: Post-op Vital signs reviewed, Patient's Cardiovascular Status Stable, Respiratory Function Stable, Patent Airway and No signs of Nausea or vomiting  Post-op Vital Signs: Reviewed and stable  Complications: No apparent anesthesia complications

## 2019-03-30 NOTE — Anesthesia Postprocedure Evaluation (Signed)
Anesthesia Post Note  Patient: Caleb Dougherty  Procedure(s) Performed: CATARACT EXTRACTION PHACO AND INTRAOCULAR LENS PLACEMENT (IOC) LEFT DIABETES  02:21.8  19.2%  28.18 (Left Eye)  Patient location during evaluation: PACU Anesthesia Type: MAC Level of consciousness: awake and alert Pain management: pain level controlled Vital Signs Assessment: post-procedure vital signs reviewed and stable Respiratory status: spontaneous breathing, nonlabored ventilation, respiratory function stable and patient connected to nasal cannula oxygen Cardiovascular status: stable and blood pressure returned to baseline Postop Assessment: no apparent nausea or vomiting Anesthetic complications: no Comments: Intraop, patient found to have bundle branch block w/ prolonged PR interval & PACs. This rhythm was found prior to administering anesthesia and persisted throughout the case. The patient's BP remained stable. EKG from 2016 showed NSR. In the PACU, a 12 lead EKG was obtained that showed the same rhythm. The patient denied any chest pain or SOB. The patient and the patient's responsible adult were informed of the abnormal EKG and were instructed to follow up with his PCP for further evaluation.    Harsha Yusko A  Burnetta Kohls

## 2019-03-30 NOTE — Anesthesia Procedure Notes (Signed)
Procedure Name: MAC Date/Time: 03/30/2019 7:39 AM Performed by: Georga Bora, CRNA Pre-anesthesia Checklist: Patient identified, Emergency Drugs available, Suction available, Patient being monitored and Timeout performed Patient Re-evaluated:Patient Re-evaluated prior to induction Oxygen Delivery Method: Nasal cannula

## 2019-03-30 NOTE — Op Note (Signed)
OPERATIVE NOTE  JOWEL MCCORMAC [id number] 03/30/2019   PREOPERATIVE DIAGNOSIS:  Nuclear sclerotic cataract left eye.  H25.12   POSTOPERATIVE DIAGNOSIS:    Nuclear sclerotic cataract left eye.     PROCEDURE:  Phacoemusification with posterior chamber intraocular lens placement of the left eye   LENS:   Implant Name Type Inv. Item Serial No. Manufacturer Lot No. LRB No. Used Action  LENS IOL DIOP 21.5 - [id number] Intraocular Lens LENS IOL DIOP 21.5 [id number]  Left 1 Implanted       PCB00 +21.5   ULTRASOUND TIME: 2 minutes 21 seconds.  CDE 28.18   SURGEON:  Benay Pillow, MD, MPH   ANESTHESIA:  Topical with tetracaine drops augmented with 1% preservative-free intracameral lidocaine.  ESTIMATED BLOOD LOSS: <1 mL   COMPLICATIONS:  None.   DESCRIPTION OF PROCEDURE:  The patient was identified in the holding room and transported to the operating room and placed in the supine position under the operating microscope.  The left eye was identified as the operative eye and it was prepped and draped in the usual sterile ophthalmic fashion.   A 1.0 millimeter clear-corneal paracentesis was made at the 5:00 position. 0.5 ml of preservative-free 1% lidocaine with epinephrine was injected into the anterior chamber.  The anterior chamber was filled with Healon 5 viscoelastic.  A 2.4 millimeter keratome was used to make a near-clear corneal incision at the 2:00 position.  A curvilinear capsulorrhexis was made with a cystotome and capsulorrhexis forceps.  Balanced salt solution was used to hydrodissect and hydrodelineate the nucleus.   Phacoemulsification was then used in stop and chop fashion to remove the lens nucleus and epinucleus.    The lens was very dense and very large and required many chopping maneuvers and high phaco energy to remove.  Additional healon was placed mid phaco to protect the corneal endothelium.  The remaining cortex was then removed using the irrigation and  aspiration handpiece. Healon was then placed into the capsular bag to distend it for lens placement.  A lens was then injected into the capsular bag.  The remaining viscoelastic was aspirated.   Wounds were hydrated with balanced salt solution.  The anterior chamber was inflated to a physiologic pressure with balanced salt solution.  Intracameral vigamox 0.1 mL undiltued was injected into the eye and a drop placed onto the ocular surface.  No wound leaks were noted.  The patient was taken to the recovery room in stable condition without complications of anesthesia or surgery  Benay Pillow 03/30/2019, 8:05 AM

## 2019-03-30 NOTE — H&P (Signed)

## 2020-11-01 ENCOUNTER — Encounter (HOSPITAL_COMMUNITY): Payer: Self-pay | Admitting: Radiology

## 2020-11-01 ENCOUNTER — Inpatient Hospital Stay (HOSPITAL_COMMUNITY): Payer: Medicare Other

## 2020-11-01 ENCOUNTER — Inpatient Hospital Stay (HOSPITAL_COMMUNITY)
Admission: EM | Admit: 2020-11-01 | Discharge: 2020-11-08 | DRG: 304 | Disposition: A | Discharge status: Home / Self Care | Payer: Medicare Other | Attending: Internal Medicine | Admitting: Internal Medicine

## 2020-11-01 ENCOUNTER — Emergency Department (HOSPITAL_COMMUNITY): Payer: Medicare Other

## 2020-11-01 ENCOUNTER — Other Ambulatory Visit: Payer: Self-pay

## 2020-11-01 DIAGNOSIS — I248 Other forms of acute ischemic heart disease: Secondary | ICD-10-CM | POA: Diagnosis present

## 2020-11-01 DIAGNOSIS — R4701 Aphasia: Secondary | ICD-10-CM | POA: Diagnosis present

## 2020-11-01 DIAGNOSIS — Z79899 Other long term (current) drug therapy: Secondary | ICD-10-CM

## 2020-11-01 DIAGNOSIS — E785 Hyperlipidemia, unspecified: Secondary | ICD-10-CM | POA: Diagnosis present

## 2020-11-01 DIAGNOSIS — F0390 Unspecified dementia without behavioral disturbance: Secondary | ICD-10-CM | POA: Diagnosis present

## 2020-11-01 DIAGNOSIS — F05 Delirium due to known physiological condition: Secondary | ICD-10-CM | POA: Diagnosis not present

## 2020-11-01 DIAGNOSIS — J61 Pneumoconiosis due to asbestos and other mineral fibers: Secondary | ICD-10-CM | POA: Diagnosis present

## 2020-11-01 DIAGNOSIS — I4510 Unspecified right bundle-branch block: Secondary | ICD-10-CM | POA: Diagnosis present

## 2020-11-01 DIAGNOSIS — Z20822 Contact with and (suspected) exposure to covid-19: Secondary | ICD-10-CM | POA: Diagnosis present

## 2020-11-01 DIAGNOSIS — R339 Retention of urine, unspecified: Secondary | ICD-10-CM

## 2020-11-01 DIAGNOSIS — R29810 Facial weakness: Secondary | ICD-10-CM | POA: Diagnosis present

## 2020-11-01 DIAGNOSIS — I10 Essential (primary) hypertension: Secondary | ICD-10-CM | POA: Diagnosis present

## 2020-11-01 DIAGNOSIS — R4702 Dysphasia: Secondary | ICD-10-CM | POA: Diagnosis present

## 2020-11-01 DIAGNOSIS — R918 Other nonspecific abnormal finding of lung field: Secondary | ICD-10-CM | POA: Diagnosis present

## 2020-11-01 DIAGNOSIS — I161 Hypertensive emergency: Secondary | ICD-10-CM | POA: Diagnosis present

## 2020-11-01 DIAGNOSIS — G9340 Encephalopathy, unspecified: Secondary | ICD-10-CM

## 2020-11-01 DIAGNOSIS — R4182 Altered mental status, unspecified: Secondary | ICD-10-CM | POA: Diagnosis present

## 2020-11-01 DIAGNOSIS — M542 Cervicalgia: Secondary | ICD-10-CM | POA: Diagnosis not present

## 2020-11-01 DIAGNOSIS — R509 Fever, unspecified: Secondary | ICD-10-CM

## 2020-11-01 DIAGNOSIS — I5030 Unspecified diastolic (congestive) heart failure: Secondary | ICD-10-CM

## 2020-11-01 DIAGNOSIS — E119 Type 2 diabetes mellitus without complications: Secondary | ICD-10-CM | POA: Diagnosis present

## 2020-11-01 DIAGNOSIS — E440 Moderate protein-calorie malnutrition: Secondary | ICD-10-CM | POA: Diagnosis present

## 2020-11-01 DIAGNOSIS — N170 Acute kidney failure with tubular necrosis: Secondary | ICD-10-CM | POA: Diagnosis not present

## 2020-11-01 DIAGNOSIS — G0481 Other encephalitis and encephalomyelitis: Secondary | ICD-10-CM | POA: Diagnosis present

## 2020-11-01 DIAGNOSIS — I169 Hypertensive crisis, unspecified: Secondary | ICD-10-CM

## 2020-11-01 DIAGNOSIS — G8191 Hemiplegia, unspecified affecting right dominant side: Secondary | ICD-10-CM | POA: Diagnosis present

## 2020-11-01 DIAGNOSIS — N179 Acute kidney failure, unspecified: Secondary | ICD-10-CM | POA: Diagnosis not present

## 2020-11-01 DIAGNOSIS — E538 Deficiency of other specified B group vitamins: Secondary | ICD-10-CM | POA: Diagnosis present

## 2020-11-01 DIAGNOSIS — Z794 Long term (current) use of insulin: Secondary | ICD-10-CM

## 2020-11-01 DIAGNOSIS — Z7984 Long term (current) use of oral hypoglycemic drugs: Secondary | ICD-10-CM

## 2020-11-01 DIAGNOSIS — R778 Other specified abnormalities of plasma proteins: Secondary | ICD-10-CM | POA: Diagnosis not present

## 2020-11-01 DIAGNOSIS — Z823 Family history of stroke: Secondary | ICD-10-CM

## 2020-11-01 DIAGNOSIS — I639 Cerebral infarction, unspecified: Secondary | ICD-10-CM

## 2020-11-01 DIAGNOSIS — R262 Difficulty in walking, not elsewhere classified: Secondary | ICD-10-CM | POA: Diagnosis present

## 2020-11-01 DIAGNOSIS — Z96653 Presence of artificial knee joint, bilateral: Secondary | ICD-10-CM | POA: Diagnosis present

## 2020-11-01 DIAGNOSIS — Z820 Family history of epilepsy and other diseases of the nervous system: Secondary | ICD-10-CM

## 2020-11-01 DIAGNOSIS — Z96612 Presence of left artificial shoulder joint: Secondary | ICD-10-CM | POA: Diagnosis present

## 2020-11-01 DIAGNOSIS — Z6825 Body mass index (BMI) 25.0-25.9, adult: Secondary | ICD-10-CM

## 2020-11-01 DIAGNOSIS — Z7982 Long term (current) use of aspirin: Secondary | ICD-10-CM

## 2020-11-01 DIAGNOSIS — G9341 Metabolic encephalopathy: Secondary | ICD-10-CM | POA: Diagnosis present

## 2020-11-01 DIAGNOSIS — R269 Unspecified abnormalities of gait and mobility: Secondary | ICD-10-CM | POA: Diagnosis not present

## 2020-11-01 DIAGNOSIS — Z66 Do not resuscitate: Secondary | ICD-10-CM | POA: Diagnosis not present

## 2020-11-01 DIAGNOSIS — R911 Solitary pulmonary nodule: Secondary | ICD-10-CM | POA: Diagnosis not present

## 2020-11-01 DIAGNOSIS — Z85820 Personal history of malignant melanoma of skin: Secondary | ICD-10-CM

## 2020-11-01 DIAGNOSIS — I160 Hypertensive urgency: Secondary | ICD-10-CM | POA: Diagnosis not present

## 2020-11-01 DIAGNOSIS — Z888 Allergy status to other drugs, medicaments and biological substances status: Secondary | ICD-10-CM

## 2020-11-01 LAB — URINALYSIS, ROUTINE W REFLEX MICROSCOPIC
Bacteria, UA: NONE SEEN
Bilirubin Urine: NEGATIVE
Glucose, UA: >=500 mg/dL — AB
Ketones, ur: 20 mg/dL — AB
Leukocytes,Ua: NEGATIVE
Nitrite: NEGATIVE
Protein, ur: 100 mg/dL — AB
Specific Gravity, Urine: 1.024 (ref 1.005–1.030)
pH: 5 (ref 5.0–8.0)

## 2020-11-01 LAB — DIFFERENTIAL
Abs Immature Granulocytes: 0.03 10*3/uL (ref 0.00–0.07)
Basophils Absolute: 0.1 10*3/uL (ref 0.0–0.1)
Basophils Relative: 1 %
Eosinophils Absolute: 0.1 10*3/uL (ref 0.0–0.5)
Eosinophils Relative: 2 %
Immature Granulocytes: 1 %
Lymphocytes Relative: 23 %
Lymphs Abs: 1.5 10*3/uL (ref 0.7–4.0)
Monocytes Absolute: 0.3 10*3/uL (ref 0.1–1.0)
Monocytes Relative: 5 %
Neutro Abs: 4.4 10*3/uL (ref 1.7–7.7)
Neutrophils Relative %: 68 %

## 2020-11-01 LAB — COMPREHENSIVE METABOLIC PANEL
ALT: 21 U/L (ref 0–44)
AST: 21 U/L (ref 15–41)
Albumin: 3.5 g/dL (ref 3.5–5.0)
Alkaline Phosphatase: 30 U/L — ABNORMAL LOW (ref 38–126)
Anion gap: 9 (ref 5–15)
BUN: 25 mg/dL — ABNORMAL HIGH (ref 8–23)
CO2: 26 mmol/L (ref 22–32)
Calcium: 9.3 mg/dL (ref 8.9–10.3)
Chloride: 106 mmol/L (ref 98–111)
Creatinine, Ser: 1.23 mg/dL (ref 0.61–1.24)
GFR, Estimated: 56 mL/min — ABNORMAL LOW (ref >60)
Glucose, Bld: 239 mg/dL — ABNORMAL HIGH (ref 70–99)
Potassium: 5 mmol/L (ref 3.5–5.1)
Sodium: 141 mmol/L (ref 135–145)
Total Bilirubin: 1.5 mg/dL — ABNORMAL HIGH (ref 0.3–1.2)
Total Protein: 6.6 g/dL (ref 6.5–8.1)

## 2020-11-01 LAB — VITAMIN B12: Vitamin B-12: 189 pg/mL (ref 180–914)

## 2020-11-01 LAB — ECHOCARDIOGRAM COMPLETE
Area-P 1/2: 5.54 cm2
Height: 67 in
S' Lateral: 3.1 cm
Weight: 2560 oz

## 2020-11-01 LAB — TROPONIN I (HIGH SENSITIVITY)
Troponin I (High Sensitivity): 47 ng/L — ABNORMAL HIGH (ref <18)
Troponin I (High Sensitivity): 97 ng/L — ABNORMAL HIGH (ref <18)
Troponin I (High Sensitivity): 113 ng/L (ref <18)

## 2020-11-01 LAB — CBC
HCT: 38.9 % — ABNORMAL LOW (ref 39.0–52.0)
Hemoglobin: 12.8 g/dL — ABNORMAL LOW (ref 13.0–17.0)
MCH: 30.7 pg (ref 26.0–34.0)
MCHC: 32.9 g/dL (ref 30.0–36.0)
MCV: 93.3 fL (ref 80.0–100.0)
Platelets: 171 10*3/uL (ref 150–400)
RBC: 4.17 MIL/uL — ABNORMAL LOW (ref 4.22–5.81)
RDW: 13.2 % (ref 11.5–15.5)
WBC: 6.4 10*3/uL (ref 4.0–10.5)
nRBC: 0 % (ref 0.0–0.2)

## 2020-11-01 LAB — I-STAT CHEM 8, ED
BUN: 28 mg/dL — ABNORMAL HIGH (ref 8–23)
Calcium, Ion: 1.15 mmol/L (ref 1.15–1.40)
Chloride: 107 mmol/L (ref 98–111)
Creatinine, Ser: 1.1 mg/dL (ref 0.61–1.24)
Glucose, Bld: 231 mg/dL — ABNORMAL HIGH (ref 70–99)
HCT: 36 % — ABNORMAL LOW (ref 39.0–52.0)
Hemoglobin: 12.2 g/dL — ABNORMAL LOW (ref 13.0–17.0)
Potassium: 4.8 mmol/L (ref 3.5–5.1)
Sodium: 142 mmol/L (ref 135–145)
TCO2: 25 mmol/L (ref 22–32)

## 2020-11-01 LAB — GLUCOSE, CAPILLARY
Glucose-Capillary: 273 mg/dL — ABNORMAL HIGH (ref 70–99)
Glucose-Capillary: 261 mg/dL — ABNORMAL HIGH (ref 70–99)

## 2020-11-01 LAB — TSH: TSH: 1.473 u[IU]/mL (ref 0.350–4.500)

## 2020-11-01 LAB — APTT: aPTT: 32 seconds (ref 24–36)

## 2020-11-01 LAB — PROTIME-INR
INR: 1.2 (ref 0.8–1.2)
Prothrombin Time: 14.5 seconds (ref 11.4–15.2)

## 2020-11-01 LAB — CBG MONITORING, ED: Glucose-Capillary: 218 mg/dL — ABNORMAL HIGH (ref 70–99)

## 2020-11-01 LAB — SARS CORONAVIRUS 2 (TAT 6-24 HRS): SARS Coronavirus 2: NEGATIVE

## 2020-11-01 LAB — AMMONIA: Ammonia: 10 umol/L (ref 9–35)

## 2020-11-01 MED ORDER — VANCOMYCIN HCL 1250 MG/250ML IV SOLN
1250.0000 mg | INTRAVENOUS | Status: DC
  Administered 2020-11-02: 1250 mg via INTRAVENOUS
  Filled 2020-11-01 (×2): qty 250

## 2020-11-01 MED ORDER — NITROGLYCERIN IN D5W 200-5 MCG/ML-% IV SOLN
0.0000 ug/min | INTRAVENOUS | Status: DC
  Administered 2020-11-01: 5 ug/min via INTRAVENOUS
  Filled 2020-11-01: qty 250

## 2020-11-01 MED ORDER — STROKE: EARLY STAGES OF RECOVERY BOOK: Freq: Once | Status: DC

## 2020-11-01 MED ORDER — ACETAMINOPHEN 160 MG/5ML PO SOLN: 650.0000 mg | ORAL | Status: DC | PRN

## 2020-11-01 MED ORDER — VANCOMYCIN HCL 1500 MG/300ML IV SOLN
1500.0000 mg | Freq: Once | INTRAVENOUS | Status: AC
  Administered 2020-11-01: 1500 mg via INTRAVENOUS
  Filled 2020-11-01: qty 300

## 2020-11-01 MED ORDER — ENOXAPARIN SODIUM 40 MG/0.4ML ~~LOC~~ SOLN
40.0000 mg | SUBCUTANEOUS | Status: DC
  Administered 2020-11-01: 40 mg via SUBCUTANEOUS
  Filled 2020-11-01: qty 0.4

## 2020-11-01 MED ORDER — ONDANSETRON HCL 4 MG/2ML IJ SOLN
4.0000 mg | INTRAMUSCULAR | Status: AC
  Administered 2020-11-01: 4 mg via INTRAVENOUS
  Filled 2020-11-01: qty 2

## 2020-11-01 MED ORDER — ACETAMINOPHEN 650 MG RE SUPP
650.0000 mg | RECTAL | Status: DC | PRN
  Administered 2020-11-01 (×2): 650 mg via RECTAL
  Filled 2020-11-01 (×2): qty 1

## 2020-11-01 MED ORDER — IOHEXOL 350 MG/ML SOLN
100.0000 mL | Freq: Once | INTRAVENOUS | Status: AC | PRN
  Administered 2020-11-01: 100 mL via INTRAVENOUS

## 2020-11-01 MED ORDER — INSULIN GLARGINE 100 UNIT/ML ~~LOC~~ SOLN
20.0000 [IU] | Freq: Every morning | SUBCUTANEOUS | Status: DC
  Administered 2020-11-01 – 2020-11-04 (×4): 20 [IU] via SUBCUTANEOUS
  Filled 2020-11-01 (×5): qty 0.2

## 2020-11-01 MED ORDER — SODIUM CHLORIDE 0.9 % IV SOLN
2.0000 g | Freq: Two times a day (BID) | INTRAVENOUS | Status: DC
  Administered 2020-11-01 – 2020-11-04 (×6): 2 g via INTRAVENOUS
  Filled 2020-11-01 (×2): qty 2
  Filled 2020-11-01 (×3): qty 20
  Filled 2020-11-01: qty 2
  Filled 2020-11-01 (×2): qty 20

## 2020-11-01 MED ORDER — SODIUM CHLORIDE 0.9% FLUSH
3.0000 mL | Freq: Once | INTRAVENOUS | Status: AC
Start: 2020-11-01 — End: 2020-11-01
  Administered 2020-11-01: 3 mL via INTRAVENOUS

## 2020-11-01 MED ORDER — SODIUM CHLORIDE 0.9 % IV SOLN
2.0000 g | Freq: Four times a day (QID) | INTRAVENOUS | Status: DC
  Administered 2020-11-01 – 2020-11-04 (×11): 2 g via INTRAVENOUS
  Filled 2020-11-01 (×2): qty 2000
  Filled 2020-11-01: qty 2
  Filled 2020-11-01 (×8): qty 2000
  Filled 2020-11-01: qty 2
  Filled 2020-11-01: qty 2000

## 2020-11-01 MED ORDER — DEXTROSE 5 % IV SOLN
10.0000 mg/kg | Freq: Two times a day (BID) | INTRAVENOUS | Status: DC
  Administered 2020-11-01 – 2020-11-04 (×7): 725 mg via INTRAVENOUS
  Filled 2020-11-01 (×12): qty 14.5

## 2020-11-01 MED ORDER — ACETAMINOPHEN 325 MG PO TABS
650.0000 mg | ORAL_TABLET | ORAL | Status: DC | PRN
  Filled 2020-11-01: qty 2

## 2020-11-01 NOTE — ED Triage Notes (Signed)
Pt BIB [facility] for Code Stroke. Per family, LSN was 2100 last night. On EMS arrival, pt found to have L sided facial droop and aphasia. Pt is normally ambulatory and able to care for himself. Wife is a poor historian and has hx of dementia, stated he attempted to get out of bed and fell back into bed.  EMS VS 190/97, CBG 197.

## 2020-11-01 NOTE — Progress Notes (Signed)
  Echocardiogram 2D Echocardiogram has been performed.  Caleb Dougherty 11/01/2020, 2:45 PM

## 2020-11-01 NOTE — ED Notes (Signed)
Attempted to call report

## 2020-11-01 NOTE — Code Documentation (Signed)
Patient was LKW on 4/4 at 2100 per family at his usual independent baseline. Hx: HTN, DM, Cataract surgery. Pt lives with his wife who has dementia. Wife called family this am after pt was not acting right and had trouble getting back from the bathroom. Pt's son who lives across the street came over and found patient aphasic, weak on the right, and with a facial droop. [facility] arrived and activated the code stroke. Pt's BP 190/97 and CBG 197 in route. Pt taken to [facility] and met by the stroke team, EDP and cleared for scans. Pt taken for CT/CTA/CTP. Results below. Pt's NIHSS 21 (see stroke documentation for details). Pt is outside the window for TPA and was negative for an LVO per MD therefore no acute treatment given. Care Plan: Q2x12 then Q4 vitals/neuro checks. Hand off with Raquel Sarna RN.   "IMPRESSION: No large vessel occlusion or hemodynamically significant stenosis in the neck.  No proximal intracranial vessel occlusion. Possible mild atherosclerotic irregularity of left M3 MCA branch in the region of the frontal operculum.  Perfusion imaging demonstrates no evidence of core infarct or [place]umb[place]."   Gabryelle Whitmoyer, Rande Brunt, RN  Stroke Response Nurse

## 2020-11-01 NOTE — Progress Notes (Signed)
   11/01/20 2146  Assess: MEWS Score  Temp (!) 100.6 F (38.1 C)  BP (!) 144/73  Pulse Rate 94  ECG Heart Rate 96  Resp 13  Level of Consciousness Alert  SpO2 96 %  O2 Device Room Air  Assess: MEWS Score  MEWS Temp 1  MEWS Systolic 0  MEWS Pulse 0  MEWS RR 1  MEWS LOC 0  MEWS Score 2  MEWS Score Color Yellow  Assess: if the MEWS score is Yellow or Red  Were vital signs taken at a resting state? Yes  Focused Assessment No change from prior assessment  Early Detection of Sepsis Score *See Row Information* Low  MEWS guidelines implemented *See Row Information* Yes  Treat  Pain Scale PAINAD  Breathing 0  Negative Vocalization 0  Facial Expression 0  Body Language 1  Consolability 1  PAINAD Score 2  Take Vital Signs  Increase Vital Sign Frequency  Yellow: Q 2hr X 2 then Q 4hr X 2, if remains yellow, continue Q 4hrs  Escalate  MEWS: Escalate Yellow: discuss with charge nurse/RN and consider discussing with provider and RRT  Notify: Charge Nurse/RN  Name of Charge Nurse/RN Notified Dana, RN  Date Charge Nurse/RN Notified 11/01/20  Time Charge Nurse/RN Notified 2226  Document  Patient Outcome Other (Comment) (stable in department)  Progress note created (see row info) Yes

## 2020-11-01 NOTE — ED Notes (Signed)
Notified Dr. Darrick Meigs of rectal temp of 102.9. 650 mg of tylenol PR given. No new orders received at this time.

## 2020-11-01 NOTE — Progress Notes (Signed)
Paged by RN for an abnormal rhythm noted on telemetry. EKG showed a RBBB and TW changes that was not present on one from earlier this morning.  I spoke with Dr. Virgina Jock with cardiology who did not feel that this needed further evaluation as it was present on his EKG from 2020.  Plan: trend serial trops

## 2020-11-01 NOTE — ED Notes (Signed)
EEG at bedside, EEG tech came to desk to tell this RN that pt had an episode where he sneezed and monitor appeared to have no activity. Notified MD. Pt had episode of brown emesis. Pt alert upon assessment, LOC unchanged since arrival. Orders placed per MD. Cardiac strip provided to MD. Pt resting at this time.

## 2020-11-01 NOTE — Consult Note (Addendum)
Neurology Consultation  Reason for Consult: Code Stroke Referring Physician: Judge Stall MD  CC: Left sided weakness and facial droop  History is obtained from: EMS and son  HPI: Caleb Dougherty is a 87 y.o. male who was LKW at 2100 on 4/4. At that time he went to bed. He woke up and went to the bathroom but afterwards was unable to get back to bed due to weakness. Per wife he got up around 7 AM to use the bathroom, however, per family she has dementia and so cannot rely on her time frame.  Called son and he reports that he was over at his parents house last night and he was in his usual state of health at that time. He left when his parents went to bed around 2100. He reports that when he got to his parents house this morning there were signs that he had gotten out of bed and used the bathroom and then returned to bed. He reports his mother told him she had to move over in the bed for him to sit down when he could not return to his side due to his weakness. He says he does believe his mother in this detail but that she does not have a reliable time frame so cannot trust her time of 7 AM.  On further questioning, the son reports that the patient has been off and off in terms of his mentation over the past 2 weeks.  This all started after March 26 when he got his booster shot at the [facility].  He has seen his primary care and was presumably being treated for UTI probably leading to this sort of encephalopathy.  That said, he was talking and making jokes yesterday at night when the family visited him.   LKW: 2100 4/4 tpa given?: no, outside of window Thrombectomy: No LVO Modified Rankin: 3  ROS: Unable to obtain due to altered mental status.   Past Medical History:  Diagnosis Date  . Arthritis    osteoarthritis-knees and feet  . BPH (benign prostatic hypertrophy)   . Cancer [facility])    melanoma(back)-no futher issues. Skin cancers face "frozen"  . Cataract    02-12-14 left- ready for  surgery, but only has had right done  . Diabetes mellitus without complication ([place])    diabetes x4 yrs-oral med and Lantus used  . HOH (hard of hearing)    wears bilateral hearing aids  . Hypertension   . Impaired hearing    hard of hearing -no hearing aids  . Urgency of urination   . Wears dentures    full lower  . Wears glasses    reading    No family history on file.   Social History:   reports that he has never smoked. He has never used smokeless tobacco. He reports that he does not drink alcohol and does not use drugs.  Medications No current facility-administered medications for this encounter.  Current Outpatient Medications:  .  aspirin EC 81 MG tablet, Take 81 mg by mouth daily., Disp: , Rfl:  .  fosinopril (MONOPRIL) 40 MG tablet, Take 40 mg by mouth every evening., Disp: , Rfl:  .  glyBURIDE (DIABETA) 5 MG tablet, Take 10 mg by mouth 2 (two) times daily with a meal. Takes 2=10mg , Disp: , Rfl:  .  hydrochlorothiazide (HYDRODIURIL) 25 MG tablet, Take 25 mg by mouth every evening. , Disp: , Rfl:  .  insulin glargine (LANTUS) 100 UNIT/ML injection,  Inject 30 Units into the skin every morning. , Disp: , Rfl:  .  losartan (COZAAR) 25 MG tablet, Take 25 mg by mouth daily., Disp: , Rfl:  .  Multiple Vitamin (MULTIVITAMIN WITH MINERALS) TABS tablet, Take 1 tablet by mouth every morning. , Disp: , Rfl:  .  simvastatin (ZOCOR) 40 MG tablet, Take 40 mg by mouth every evening., Disp: , Rfl:  .  terazosin (HYTRIN) 5 MG capsule, Take 5 mg by mouth at bedtime., Disp: , Rfl:    Exam: Current vital signs: BP (!) 212/85 (BP Location: Right Arm)   Pulse 88   Temp 98.7 F (37.1 C) (Oral)   Resp 14   SpO2 99%  Vital signs in last 24 hours: Temp:  [98.7 F (37.1 C)] 98.7 F (37.1 C) (04/05 1003) Pulse Rate:  [88] 88 (04/05 1003) Resp:  [14] 14 (04/05 1003) BP: (212)/(85) 212/85 (04/05 1003) SpO2:  [99 %] 99 % (04/05 1003)  General: alert and awake, elderly caucasian male, no  apparent distress  Lungs: Symmetrical Chest rise, no labored breathing  Cardio: Regular Rate and Rhythm  Abdomen: Soft, non-tender  Neuro: Alert, awake,Global Aphasia present. Unable to follow any commands. Cranial Nerves: Pupils-left millimeter, right 3 mm and irregular-likely postsurgical.  Both reactive to light. Extraocular movement examination shows left gaze preference with ability to look at least till midline or may be a little past midline but not all the way to the right. Does not blink to threat from the right side. Interestingly has a possibly subtle left lower facial weakness. Motor: Able hold up Right arm and leg against gravity once lifted but they both drift down.  Left leg can be held vertically without any drift.  Left upper extremity also can be held vertically without any drift. Sensory: Minimal response to noxious stimulation in right upper extremity.  Grimacing and withdrawal in all other fours. Cerebellar: could not test due to not following commands Gait: deferred NIH stroke scale 21  Labs I have reviewed labs in epic and the results pertinent to this consultation are:   CBC    Component Value Date/Time   WBC 6.4 11/01/2020 0918   RBC 4.17 (L) 11/01/2020 0918   HGB 12.2 (L) 11/01/2020 0924   HCT 36.0 (L) 11/01/2020 0924   PLT 171 11/01/2020 0918   MCV 93.3 11/01/2020 0918   MCH 30.7 11/01/2020 0918   MCHC 32.9 11/01/2020 0918   RDW 13.2 11/01/2020 0918   LYMPHSABS 1.5 11/01/2020 0918   MONOABS 0.3 11/01/2020 0918   EOSABS 0.1 11/01/2020 0918   BASOSABS 0.1 11/01/2020 0918    CMP     Component Value Date/Time   NA 142 11/01/2020 0924   K 4.8 11/01/2020 0924   CL 107 11/01/2020 0924   CO2 21 11/20/2014 0546   GLUCOSE 231 (H) 11/01/2020 0924   BUN 28 (H) 11/01/2020 0924   CREATININE 1.10 11/01/2020 0924   CALCIUM 8.2 (L) 11/20/2014 0546   GFRNONAA 47 (L) 11/20/2014 0546   GFRAA 55 (L) 11/20/2014 0546    Imaging I have reviewed the images  obtained:  CT  Head Code Stroke: There is no acute intracranial hemorrhage or evidence of acute infarction. ASPECT score is 10. Age-indeterminate small vessel infarcts or prominent perivascular spaces of the basal ganglia bilaterally. Chronic microvascular ischemic changes.   CT Angio Head and Neck Code Stroke: No large vessel occlusion or hemodynamically significant stenosis in the neck. No proximal intracranial vessel occlusion. Possible mild atherosclerotic  irregularity of left M3 MCA branch in the region of the frontal operculum. Perfusion imaging demonstrates no evidence of core infarct or penumbra.   CT Cerebral Perfusion: No large vessel occlusion or hemodynamically significant stenosis in the neck. No proximal intracranial vessel occlusion. Possible mild atherosclerotic irregularity of left M3 MCA branch in the region of the frontal operculum. Perfusion imaging demonstrates no evidence of core infarct or penumbra.   Assessment: 87 yr old male with PMHx of HTN and type II DM who presents with AMS and right sided weakness.  Clinical exam consistent with right hemiparesis and leftward gaze concerning for a large left hemispheric stroke but the CT imaging is unremarkable and his CT angiography shows no emergent LVO. Not a candidate for IV TPA due to being outside the window-last known normal sometime last night at 9 PM. Not a candidate for thrombectomy due to no LVO on vessel imaging. Son reports a subacute decline in his mentation with waxing and waning mentation.  Less likely to be due to a stroke. Differentials at this time, include, seizure with extended postictal state, versus CNS infection versus autoimmune encephalitis.  Recommendations: - Hold Aspirin for now - STAT EEG ordered-reviewed-no seizures-continuous generalized slowing. - Urinalysis - Stroke Work up - 2D Echo, lipid panel, A1C - Glycemic control and monitoring per SSI -Admit to hospitalist - No permissive HTN  required--reduce blood pressure per HTN Emergency guidelines goal is normotensive.  Reduce by 20% today and so on for goal normotension. - Meningitic and encephalitic coverage-include Listeria coverage. -Pending other work-up, will consider LP tomorrow. -Check B12, TSH, ammonia  Plan discussed with Dr. Sabra Heck and Dr. Koleen Distance from the internal medicine service  Fatima Sanger MD Resident    Attending Neurohospitalist Addendum Patient seen and examined with APP/Resident. Agree with the history and physical as documented above. Agree with the plan as documented, which I helped formulate. I have independently reviewed the chart, obtained history, review of systems and examined the patient.I have personally reviewed pertinent head/neck/spine imaging (CT/MRI). Please feel free to call with any questions. --- Amie Portland, MD [facility] Pager: [phone] If 7pm to 7am, please call on call as listed on AMION.  CRITICAL CARE ATTESTATION Performed by: Amie Portland, MD Total critical care time: 34minutes Critical care time was exclusive of separately billable procedures and treating other patients and/or supervising APPs/Residents/Students Critical care was necessary to treat or prevent imminent or life-threatening deterioration due to strokelike symptoms, seizures, possible CNS infection This patient is critically ill and at significant risk for neurological worsening and/or death and care requires constant monitoring. Critical care was time spent personally by me on the following activities: development of treatment plan with patient and/or surrogate as well as nursing, discussions with consultants, evaluation of patient's response to treatment, examination of patient, obtaining history from patient or surrogate, ordering and performing treatments and interventions, ordering and review of laboratory studies, ordering and review of radiographic studies, pulse oximetry,  re-evaluation of patient's condition, participation in multidisciplinary rounds and medical decision making of high complexity in the care of this patient.

## 2020-11-01 NOTE — Procedures (Signed)
Patient Name: Caleb Dougherty  MRN: [id number]  Epilepsy Attending: Lora Havens  Referring Physician/Provider: Dr Lestine Mount Date: 11/01/2020 Duration:   Patient history: 87 year old male with new onset sudden speech disturbance.  EEG to evaluate for seizures.  Level of alertness: Awake  AEDs during EEG study: None  Technical aspects: This EEG study was done with scalp electrodes positioned according to the 10-20 International system of electrode placement. Electrical activity was acquired at a sampling rate of 500Hz  and reviewed with a high frequency filter of 70Hz  and a low frequency filter of 1Hz . EEG data were recorded continuously and digitally stored.   Description: No posterior dominant rhythm was seen. EEG showed continuous generalized 3 to 6 Hz theta-delta slowing. Hyperventilation and photic stimulation were not performed.    Of note, parts of study were difficult to interpret due to significant movement artifact.  ABNORMALITY - Continuous slow, generalized  IMPRESSION: This technically difficult study is suggestive of moderatediffuse encephalopathy, nonspecific etiology. No seizures or epileptiform discharges were seen throughout the recording.   Fredna Stricker Barbra Sarks

## 2020-11-01 NOTE — ED Notes (Signed)
Notified MD of change in NIH, pts decreased ability to follow commands. Per MD, same found on MDs initial assessment, no need to follow up with neuro.

## 2020-11-01 NOTE — H&P (Signed)
Date: 11/01/2020               Patient Name:  KYLOR VALVERDE MRN: [id number]  DOB: May 04, 1929 Age / Sex: 87 y.o., male   PCP: [name], MD         Medical Service: Internal Medicine Teaching Service         Attending Physician: Dr. Velna Ochs, MD    First Contact: Dr. Bridgett Larsson Pager: [phone]  Second Contact: Dr. Charleen Kirks Pager: [phone]       After Hours (After 5p/  First Contact Pager: [phone]  weekends / holidays): Second Contact Pager: [phone]   Chief Complaint: confusion   History of Present Illness: Mr. Housey is an 87 y/o gentleman with history of HTN and type II DM who presents with AMS and weakness.  History was obtained by the son at bedside due to patient's aphasia. He was last seen by his son around 9 pm last night when he went to bed. When his son returned to his house around 8:15 this morning, he found him laying in the bed sideways. He was not able to speak or get up. His wife reports that he got up and went to the bathroom an hour before and mentioned that he did not think he could get back to bed. Son reports his mother is demented and questions if her timeline is accurate.   Son reports they have noticed a subtle decline in Mr. Gingerich cognition and functional status over the last few months. He is followed by Dr. Delice Lesch at the [facility]. He had an appointment with her on 3/22 to address memory concerns. A SLUMS screening test was done with a score of 11-13, indicating he had dementia. He was started on Aricept. He also received his COVID booster shot that day. His son noted a significant change the following day with worsening weakness and confusion. This persisted through 3/30 when he presented to the [facility] in [place]. Labs were unremarkable. He was able to answer all questions appropriately at this visit and didn't have any focal neurologic deficits.   No witnessed seizure like activity. Denies fevers, chills, URI symptoms, chest pain, shortness of  breath, abdominal pain, changes in bowel or bladder habits.    Meds:   Aspirin 81 mg daily  Fosinopril 40 mg daily Glyburide 5 mg daily  HCTZ 25 mg daily Lantus 25 units every morning Losartan 25 mg daily  Metformin 500 mg BID Simvastatin 40 mg daily    Allergies: Allergies as of 11/01/2020  . (No Known Allergies)   Past Medical History:  Diagnosis Date  . Arthritis    osteoarthritis-knees and feet  . BPH (benign prostatic hypertrophy)   . Cancer [facility])    melanoma(back)-no futher issues. Skin cancers face "frozen"  . Cataract    02-12-14 left- ready for surgery, but only has had right done  . Diabetes mellitus without complication ([place])    diabetes x4 yrs-oral med and Lantus used  . HOH (hard of hearing)    wears bilateral hearing aids  . Hypertension   . Impaired hearing    hard of hearing -no hearing aids  . Urgency of urination   . Wears dentures    full lower  . Wears glasses    reading    Family History: several siblings had strokes. Negative for CAD  Social History: lives independently with his wife. Son lives across the street and assists as needed. Denies tobacco, EtOH or illicit  drug use  Review of Systems: A complete ROS was negative except as per HPI.   Physical Exam: Blood pressure (!) 207/107, pulse 87, temperature 98.7 F (37.1 C), temperature source Oral, resp. rate 18, height 5\' 7"  (1.702 m), weight 72.6 kg, SpO2 99 %. General: awake, disoriented, NAD HEENT: [place]/AT. right pupil minimally larger than left, reactive to light  Neck: supple CV: RRR Pulm: normal work of breathing on room air; lungs CTAB Abd: soft, non-distended, non-tender Ext: warm and well perfused; no edema Neuro: awake, disoriented, completely aphasic, does not follow any commands. Does not hold up any of his limbs   EKG: personally reviewed my interpretation is NSR with first degree AV block, PACs. No ischemic changes    Assessment & Plan by Problem: Active Problems:    Altered mental status  Mr. Heckmann is an 87 y/o gentleman with history of HTN and type II DM who presents with AMS and weakness. He was admitted for further work-up and management   Acute encephalopathy -patient brought in as a code stroke. CT imaging was negative for LVO or hemorrhage. It did show a possible L MCA M3 branch occlusion, but exam findings are not consistent with this. Unable to get MRI due to metal object in his head -labs do not show any electrolyte derangements.  -most likely etiologies at this point are hypertensive emergency given his systolic BP sustaining >606 and was normal at his urgent care appt on 3/30. Other possibility is a viral encephalitis with report of acute worsening after COVID booster. Do not suspect bacterial meningitis as he is afebrile and does not show any meningeal signs on exam. Appreciate neurology consult. They have recommended to empirically treat both potential etiologies with broad spectrum antibiotic and antiviral coverage for meningitis/encephalitis, as well as treat hypertensive emergency with goal of 20% reduction each day. There is a system wide shortage of LP trays which why we are not delaying initiation of therapy.  -will start nitro drip with SBP goal of 160  -initiate empiric coverage for viral and bacterial meningitis/encephalitis  Type II DM -currently NPO; decrease home lantus to 20 units daily   HTN -treating potential hypertensive emergency as above   Diet: NPO DVT ppx: SCDs until decision on LP is made CODE: I discussed with patient's son, Gwynneth Macleod. He has advanced directive documents that state patient would wish for a natural death. I explained that the best way to reflect this wish in the hospital would be to make him a DNR. He wanted to discuss with his brother, [name], who is the [facility] prior to finalizing this decision. FULL CODE for now.   Dispo: Admit patient to Inpatient with expected length of stay greater than 2  midnights.  SignedDelice Bison, DO 11/01/2020, 11:55 AM  Pager: [phone] After 5pm on weekdays and 1pm on weekends: On Call pager: [phone]

## 2020-11-01 NOTE — ED Provider Notes (Signed)
[facility] EMERGENCY DEPARTMENT Provider Note   CSN: [id number] Arrival date & time: 11/01/20  0177  An emergency department physician performed an initial assessment on this suspected stroke patient at 0915.  History Chief Complaint  Patient presents with  . Code Stroke    Caleb Dougherty is a 87 y.o. male.  HPI   This patient is a very pleasant 87 year old male presenting as a code stroke from home, the paramedics are the primary historians as the patient is aphasic.  They report that this patient was last seen at 9:00 last night to be normal, he was having difficulty walking this morning and collapsed onto the bed unable to use the left side, not being able to talk and having some left-sided facial droop.  The patient has no significant history of ischemic cerebral disease per the medical record, he does appear to be a diabetic and takes medications including aspirin, glyburide, hydrochlorothiazide, Lantus, simvastatin and Terazosin.  The patient is not able to give any information, he does not talk.  There was no report of any bleeding or seizure activity.  I saw the patient immediately upon arrival with EMS and neurology.  Past Medical History:  Diagnosis Date  . Arthritis    osteoarthritis-knees and feet  . BPH (benign prostatic hypertrophy)   . Cancer [facility])    melanoma(back)-no futher issues. Skin cancers face "frozen"  . Cataract    02-12-14 left- ready for surgery, but only has had right done  . Diabetes mellitus without complication ([place])    diabetes x4 yrs-oral med and Lantus used  . HOH (hard of hearing)    wears bilateral hearing aids  . Hypertension   . Impaired hearing    hard of hearing -no hearing aids  . Urgency of urination   . Wears dentures    full lower  . Wears glasses    reading    Patient Active Problem List   Diagnosis Date Noted  . 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 DJD (degenerative joint  disease) of knee 05/25/2014  . Overweight (BMI 25.0-29.9) 02/24/2014  . S/P left TKA 02/23/2014    Past Surgical History:  Procedure Laterality Date  . CARPAL TUNNEL RELEASE Right 06/23/2013   Procedure: RIGHT CARPAL TUNNEL RELEASE;  Surgeon: Cammie Sickle., MD;  Location: [place];  Service: Orthopedics;  Laterality: Right;  . CATARACT EXTRACTION Right   . CATARACT EXTRACTION W/PHACO Left 03/30/2019   Procedure: CATARACT EXTRACTION PHACO AND INTRAOCULAR LENS PLACEMENT (IOC) LEFT DIABETES  02:21.8  19.2%  28.18;  Surgeon: Eulogio Bear, MD;  Location: [place];  Service: Ophthalmology;  Laterality: Left;  diabetic - insulin  . COLONOSCOPY    . INCISION AND DRAINAGE WOUND WITH FOREIGN BODY REMOVAL Left 12/07/2014   Procedure: INCISION AND DRAINAGE WOUND WITH FOREIGN BODY REMOVAL;  Surgeon: Corky Mull, MD;  Location: [facility];  Service: Orthopedics;  Laterality: Left;  . REVERSE SHOULDER ARTHROPLASTY Left 11/19/2014   Procedure: LEFT REVERSE SHOULDER ARTHROPLASTY;  Surgeon: Netta Cedars, MD;  Location: [place];  Service: Orthopedics;  Laterality: Left;  . TOTAL KNEE ARTHROPLASTY Left 02/23/2014   Procedure: LEFT TOTAL KNEE REPLACEMENT;  Surgeon: Mauri Pole, MD;  Location: [facility];  Service: Orthopedics;  Laterality: Left;  . TOTAL KNEE ARTHROPLASTY Right 05/25/2014   Procedure: RIGHT TOTAL KNEE ARTHROPLASTY;  Surgeon: Mauri Pole, MD;  Location: [facility];  Service: Orthopedics;  Laterality: Right;       No family history on file.  Social History   Tobacco Use  . Smoking status: Never Smoker  . Smokeless tobacco: Never Used  Vaping Use  . Vaping Use: Never used  Substance Use Topics  . Alcohol use: No  . Drug use: No    Home Medications Prior to Admission medications   Medication Sig Start Date End Date Taking? Authorizing Provider  aspirin EC 81 MG tablet Take 81 mg by mouth daily.    [provider]  fosinopril (MONOPRIL) 40 MG  tablet Take 40 mg by mouth every evening.    [provider]  glyBURIDE (DIABETA) 5 MG tablet Take 10 mg by mouth 2 (two) times daily with a meal. Takes 2=10mg     [provider]  hydrochlorothiazide (HYDRODIURIL) 25 MG tablet Take 25 mg by mouth every evening.     [provider]  insulin glargine (LANTUS) 100 UNIT/ML injection Inject 30 Units into the skin every morning.     [provider]  losartan (COZAAR) 25 MG tablet Take 25 mg by mouth daily.    [provider]  Multiple Vitamin (MULTIVITAMIN WITH MINERALS) TABS tablet Take 1 tablet by mouth every morning.     [provider]  simvastatin (ZOCOR) 40 MG tablet Take 40 mg by mouth every evening.    [provider]  terazosin (HYTRIN) 5 MG capsule Take 5 mg by mouth at bedtime.    [provider]    Allergies    Patient has no known allergies.  Review of Systems   Review of Systems  All other systems reviewed and are negative.   Physical Exam Updated Vital Signs BP (!) 207/107 (BP Location: Right Arm)   Pulse 87   Temp 98.7 F (37.1 C) (Oral)   Resp 18   SpO2 99%   Physical Exam Vitals and nursing note reviewed.  Constitutional:      General: He is in acute distress.     Appearance: He is well-developed. He is ill-appearing.  HENT:     Head: Normocephalic and atraumatic.     Mouth/Throat:     Pharynx: No oropharyngeal exudate.  Eyes:     General: No scleral icterus.       Right eye: No discharge.        Left eye: No discharge.     Conjunctiva/sclera: Conjunctivae normal.     Comments: Right pupil is abnormally, left pupil is 1 mm  Neck:     Thyroid: No thyromegaly.     Vascular: No JVD.  Cardiovascular:     Rate and Rhythm: Normal rate and regular rhythm.     Heart sounds: Normal heart sounds. No murmur heard. No friction rub. No gallop.   Pulmonary:     Effort: Pulmonary effort is normal. No respiratory distress.     Breath sounds: Normal  breath sounds. No wheezing or rales.  Abdominal:     General: Bowel sounds are normal. There is no distension.     Palpations: Abdomen is soft. There is no mass.     Tenderness: There is no abdominal tenderness.  Musculoskeletal:        General: No tenderness. Normal range of motion.     Cervical back: Normal range of motion and neck supple.     Right lower leg: Edema present.     Left lower leg: Edema present.     Comments: 1+ edema at the ankles  bilaterally, symmetrical  Lymphadenopathy:     Cervical: No cervical adenopathy.  Skin:    General: Skin is warm and dry.     Findings: No erythema or rash.  Neurological:     Mental Status: He is alert.     Comments: The patient has some weakness and numbness of his right upper and lower extremities.  He has normal strength left upper and lower, he seems to have no neglect on the right side, he is unable to talk, he tries to [facility] but nothing comes out.  There does appear to be a subtle facial droop however when I look at the picture from him on previous visits he also has appears a left-sided droop  Psychiatric:        Behavior: Behavior normal.     ED Results / Procedures / Treatments   Labs (all labs ordered are listed, but only abnormal results are displayed) Labs Reviewed  CBC - Abnormal; Notable for the following components:      Result Value   RBC 4.17 (*)    Hemoglobin 12.8 (*)    HCT 38.9 (*)    All other components within normal limits  COMPREHENSIVE METABOLIC PANEL - Abnormal; Notable for the following components:   Glucose, Bld 239 (*)    BUN 25 (*)    Alkaline Phosphatase 30 (*)    Total Bilirubin 1.5 (*)    GFR, Estimated 56 (*)    All other components within normal limits  I-STAT CHEM 8, ED - Abnormal; Notable for the following components:   BUN 28 (*)    Glucose, Bld 231 (*)    Hemoglobin 12.2 (*)    HCT 36.0 (*)    All other components within normal limits  CBG MONITORING, ED - Abnormal; Notable for the  following components:   Glucose-Capillary 218 (*)    All other components within normal limits  PROTIME-INR  APTT  DIFFERENTIAL  URINALYSIS, ROUTINE W REFLEX MICROSCOPIC    EKG EKG Interpretation  Date/Time:  Tuesday November 01 2020 09:54:01 EDT Ventricular Rate:  94 PR Interval:  183 QRS Duration: 144 QT Interval:  395 QTC Calculation: 494 R Axis:   -86 Text Interpretation: First-degree AV block with what appears to be sinus rhythm, no change in QRS with, compared with last EKG bradycardia no longer seen Confirmed by Noemi Chapel [phone]) on 11/01/2020 9:57:38 AM   Radiology CT CEREBRAL PERFUSION W CONTRAST  Result Date: 11/01/2020 CLINICAL DATA:  Code stroke follow-up, left-sided facial droop and aphasia EXAM: CT ANGIOGRAPHY HEAD AND NECK CT PERFUSION BRAIN TECHNIQUE: Multidetector CT imaging of the head and neck was performed using the standard protocol during bolus administration of intravenous contrast. Multiplanar CT image reconstructions and MIPs were obtained to evaluate the vascular anatomy. Carotid stenosis measurements (when applicable) are obtained utilizing NASCET criteria, using the distal internal carotid diameter as the denominator. Multiphase CT imaging of the brain was performed following IV bolus contrast injection. Subsequent parametric perfusion maps were calculated using RAPID software. CONTRAST:  124mL OMNIPAQUE IOHEXOL 350 MG/ML SOLN COMPARISON:  None. FINDINGS: CTA NECK Aortic arch: Great vessel origins are patent. Right carotid system: Patent. Calcified plaque at the common carotid bifurcation and ICA origin causing minimal stenosis. Left carotid system: Patent. Calcified plaque at the common carotid bifurcation and ICA origin causing minimal stenosis. Vertebral arteries: Patent and codominant.  No stenosis. Skeleton: Degenerative changes of the cervical spine. Prominent retrodental soft tissue partially effacing the ventral subarachnoid space. Other neck:  Unremarkable.  Upper chest: Bilateral calcified pleural plaques. Dependent atelectatic changes. Review of the MIP images confirms the above findings CTA HEAD Anterior circulation: Intracranial internal carotid arteries are patent. Calcified plaque along the distal cavernous and paraclinoid portions causing mild to moderate stenosis. Middle cerebral arteries are patent. There is possible mild irregularity of left M3 MCA branch in the region of the frontal operculum. Anterior cerebral arteries are patent. Posterior circulation: Intracranial vertebral arteries, basilar artery, and posterior cerebral arteries are patent. Major cerebellar artery origins are patent. Bilateral posterior communicating arteries are present. Venous sinuses: Patent as allowed by contrast bolus timing. Review of the MIP images confirms the above findings CT Brain Perfusion Findings: CBF (<30%) Volume: 69mL Perfusion (Tmax>6.0s) volume: 88mL Mismatch Volume: 73mL Infarction Location: None. IMPRESSION: No large vessel occlusion or hemodynamically significant stenosis in the neck. No proximal intracranial vessel occlusion. Possible mild atherosclerotic irregularity of left M3 MCA branch in the region of the frontal operculum. Perfusion imaging demonstrates no evidence of core infarct or penumbra. Initial results were discussed by telephone at the time of interpretation on 11/01/2020 at 9:43 am to provider Dr. Rory Percy, who verbally acknowledged these results. Electronically Signed   By: Macy Mis M.D.   On: 11/01/2020 09:58   CT HEAD CODE STROKE WO CONTRAST  Result Date: 11/01/2020 CLINICAL DATA:  Code stroke. EXAM: CT HEAD WITHOUT CONTRAST TECHNIQUE: Contiguous axial images were obtained from the base of the skull through the vertex without intravenous contrast. COMPARISON:  None. FINDINGS: Brain: There is no acute intracranial hemorrhage, mass effect, or edema. Gray-white differentiation is preserved. Age-indeterminate small vessel infarcts or prominent  perivascular spaces of the lentiform nuclei bilaterally. Additional patchy hypoattenuation in the supratentorial white matter is nonspecific but may reflect mild to moderate chronic microvascular ischemic changes. Prominence of the ventricles and sulci reflects generalized parenchymal volume loss. Vascular: No hyperdense vessel. There is intracranial atherosclerotic calcification at the skull base. Skull: Unremarkable Sinuses/Orbits: Minor mucosal thickening. Bilateral lens replacements. Other: Minimal left mastoid tip opacification. Radiopaque foreign body of the anterior left frontal scalp. ASPECTS ([employer] Stroke Program Early CT Score) - Ganglionic level infarction (caudate, lentiform nuclei, internal capsule, insula, M1-M3 cortex): 7 - Supraganglionic infarction (M4-M6 cortex): 3 Total score (0-10 with 10 being normal): 10 IMPRESSION: There is no acute intracranial hemorrhage or evidence of acute infarction. ASPECT score is 10. Age-indeterminate small vessel infarcts or prominent perivascular spaces of the basal ganglia bilaterally. Chronic microvascular ischemic changes. These results were communicated to Dr. Rory Percy at 9:33 am on 11/01/2020 by text page via the [facility] messaging system. Electronically Signed   By: Macy Mis M.D.   On: 11/01/2020 09:38   CT ANGIO HEAD CODE STROKE  Result Date: 11/01/2020 CLINICAL DATA:  Code stroke follow-up, left-sided facial droop and aphasia EXAM: CT ANGIOGRAPHY HEAD AND NECK CT PERFUSION BRAIN TECHNIQUE: Multidetector CT imaging of the head and neck was performed using the standard protocol during bolus administration of intravenous contrast. Multiplanar CT image reconstructions and MIPs were obtained to evaluate the vascular anatomy. Carotid stenosis measurements (when applicable) are obtained utilizing NASCET criteria, using the distal internal carotid diameter as the denominator. Multiphase CT imaging of the brain was performed following IV bolus contrast injection.  Subsequent parametric perfusion maps were calculated using RAPID software. CONTRAST:  132mL OMNIPAQUE IOHEXOL 350 MG/ML SOLN COMPARISON:  None. FINDINGS: CTA NECK Aortic arch: Great vessel origins are patent. Right carotid system: Patent. Calcified plaque at the common carotid bifurcation and ICA origin causing minimal  stenosis. Left carotid system: Patent. Calcified plaque at the common carotid bifurcation and ICA origin causing minimal stenosis. Vertebral arteries: Patent and codominant.  No stenosis. Skeleton: Degenerative changes of the cervical spine. Prominent retrodental soft tissue partially effacing the ventral subarachnoid space. Other neck: Unremarkable. Upper chest: Bilateral calcified pleural plaques. Dependent atelectatic changes. Review of the MIP images confirms the above findings CTA HEAD Anterior circulation: Intracranial internal carotid arteries are patent. Calcified plaque along the distal cavernous and paraclinoid portions causing mild to moderate stenosis. Middle cerebral arteries are patent. There is possible mild irregularity of left M3 MCA branch in the region of the frontal operculum. Anterior cerebral arteries are patent. Posterior circulation: Intracranial vertebral arteries, basilar artery, and posterior cerebral arteries are patent. Major cerebellar artery origins are patent. Bilateral posterior communicating arteries are present. Venous sinuses: Patent as allowed by contrast bolus timing. Review of the MIP images confirms the above findings CT Brain Perfusion Findings: CBF (<30%) Volume: 2mL Perfusion (Tmax>6.0s) volume: 49mL Mismatch Volume: 21mL Infarction Location: None. IMPRESSION: No large vessel occlusion or hemodynamically significant stenosis in the neck. No proximal intracranial vessel occlusion. Possible mild atherosclerotic irregularity of left M3 MCA branch in the region of the frontal operculum. Perfusion imaging demonstrates no evidence of core infarct or penumbra. Initial  results were discussed by telephone at the time of interpretation on 11/01/2020 at 9:43 am to provider Dr. Rory Percy, who verbally acknowledged these results. Electronically Signed   By: Macy Mis M.D.   On: 11/01/2020 09:58   CT ANGIO NECK CODE STROKE  Result Date: 11/01/2020 CLINICAL DATA:  Code stroke follow-up, left-sided facial droop and aphasia EXAM: CT ANGIOGRAPHY HEAD AND NECK CT PERFUSION BRAIN TECHNIQUE: Multidetector CT imaging of the head and neck was performed using the standard protocol during bolus administration of intravenous contrast. Multiplanar CT image reconstructions and MIPs were obtained to evaluate the vascular anatomy. Carotid stenosis measurements (when applicable) are obtained utilizing NASCET criteria, using the distal internal carotid diameter as the denominator. Multiphase CT imaging of the brain was performed following IV bolus contrast injection. Subsequent parametric perfusion maps were calculated using RAPID software. CONTRAST:  137mL OMNIPAQUE IOHEXOL 350 MG/ML SOLN COMPARISON:  None. FINDINGS: CTA NECK Aortic arch: Great vessel origins are patent. Right carotid system: Patent. Calcified plaque at the common carotid bifurcation and ICA origin causing minimal stenosis. Left carotid system: Patent. Calcified plaque at the common carotid bifurcation and ICA origin causing minimal stenosis. Vertebral arteries: Patent and codominant.  No stenosis. Skeleton: Degenerative changes of the cervical spine. Prominent retrodental soft tissue partially effacing the ventral subarachnoid space. Other neck: Unremarkable. Upper chest: Bilateral calcified pleural plaques. Dependent atelectatic changes. Review of the MIP images confirms the above findings CTA HEAD Anterior circulation: Intracranial internal carotid arteries are patent. Calcified plaque along the distal cavernous and paraclinoid portions causing mild to moderate stenosis. Middle cerebral arteries are patent. There is possible mild  irregularity of left M3 MCA branch in the region of the frontal operculum. Anterior cerebral arteries are patent. Posterior circulation: Intracranial vertebral arteries, basilar artery, and posterior cerebral arteries are patent. Major cerebellar artery origins are patent. Bilateral posterior communicating arteries are present. Venous sinuses: Patent as allowed by contrast bolus timing. Review of the MIP images confirms the above findings CT Brain Perfusion Findings: CBF (<30%) Volume: 41mL Perfusion (Tmax>6.0s) volume: 33mL Mismatch Volume: 65mL Infarction Location: None. IMPRESSION: No large vessel occlusion or hemodynamically significant stenosis in the neck. No proximal intracranial vessel occlusion. Possible mild atherosclerotic irregularity  of left M3 MCA branch in the region of the frontal operculum. Perfusion imaging demonstrates no evidence of core infarct or penumbra. Initial results were discussed by telephone at the time of interpretation on 11/01/2020 at 9:43 am to provider Dr. Rory Percy, who verbally acknowledged these results. Electronically Signed   By: Macy Mis M.D.   On: 11/01/2020 09:58    Procedures .Critical Care Performed by: Noemi Chapel, MD Authorized by: Noemi Chapel, MD   Critical care provider statement:    Critical care time (minutes):  35   Critical care time was exclusive of:  Separately billable procedures and treating other patients and teaching time   Critical care was necessary to treat or prevent imminent or life-threatening deterioration of the following conditions:  CNS failure or compromise   Critical care was time spent personally by me on the following activities:  Blood draw for specimens, development of treatment plan with patient or surrogate, discussions with consultants, evaluation of patient's response to treatment, examination of patient, obtaining history from patient or surrogate, ordering and performing treatments and interventions, ordering and review of  laboratory studies, ordering and review of radiographic studies, pulse oximetry, re-evaluation of patient's condition and review of old charts     Medications Ordered in ED Medications  ondansetron (ZOFRAN) injection 4 mg (has no administration in time range)  sodium chloride flush (NS) 0.9 % injection 3 mL (3 mLs Intravenous Given 11/01/20 1008)  iohexol (OMNIPAQUE) 350 MG/ML injection 100 mL (100 mLs Intravenous Contrast Given 11/01/20 0944)    ED Course  I have reviewed the triage vital signs and the nursing notes.  Pertinent labs & imaging results that were available during my care of the patient were reviewed by me and considered in my medical decision making (see chart for details).  Clinical Course as of 11/01/20 1101  Tue Nov 01, 2020  0952 Per neurology no obvious obvious large vessel occlusion on angiogram and perfusion studies appear unremarkable.  On repeat exam at 950 the patient still has right-sided weakness, neglect of the right side, not able to even say his name, left side still seems to be moving appropriately, he does not follow commands very well [BM]  0952 Labs show normal renal function [BM]    Clinical Course User Index [BM] Noemi Chapel, MD   MDM Rules/Calculators/A&P                          This patient is critically ill with what appears to be an acute ischemic stroke, would consider other etiologies as well including seizure, hemorrhage, electrolytes, infection though though seem less likely with such a significant change.  This is likely middle cerebral artery territory.  He is going to the scanner, code stroke, neurology at the bedside.  Chart reviewed, patient recently seen within the last week at primary care physician's office due to some issues with memory and some more difficulty with ambulation using a walker at this point.  Recently started on Aricept.  The changes from today are significantly more intense with unilateral weakness.  Per neurology EEG  will be ordered, medical admission recommended.  EKG performed on November 01, 2020 at 9:54 AM shows normal sinus rhythm, occasional PACs, widened QRS, no ST elevation or depression, no atrial fibrillation.  The patient is currently getting his EEG, he is vomiting profusely, dentures taken out, suctioned, patient seems to be better, Zofran given.  Discussed care with admitting team, internal medicine teaching  service, they will come see the patient for admission  Final Clinical Impression(s) / ED Diagnoses Final diagnoses:  Acute ischemic stroke [facility]  Acute encephalopathy  Hypertensive crisis    Rx / DC Orders ED Discharge Orders    None       Noemi Chapel, MD 11/01/20 1101

## 2020-11-01 NOTE — Progress Notes (Signed)
STAT EEG complete, Results pending

## 2020-11-01 NOTE — Progress Notes (Signed)
Subjective:   Caleb Dougherty is able to answer some questions today. Still having some speech difficulty but much improved. Comprehension is fairly good, but has a hard time following some commands. He denies any acute pain, including head, neck or abdominal pain. Last BM yesterday in the ED.    Both sons are at bedside and updated regarding plan, including infectious work up and plan for MRI.  Clarified CODE STATUS.  Discussed with son, Caleb Dougherty, yesterday who felt that his father would want to be DNR, but left as full code since his brother, Caleb Dougherty, his healthcare power attorney.  Caleb Dougherty, is present today and agrees that his father would want to be DNR.  Have changed CODE STATUS, and will revisit with patient as patient improves.  Objective:  Vital signs in last 24 hours: Vitals:   11/01/20 2329 11/02/20 0126 11/02/20 0300 11/02/20 0747  BP: (!) 141/59 134/61 139/61 137/61  Pulse: 80 66 60 65  Resp: 15 16 15 17   Temp: 98.2 F (36.8 C) 99.7 F (37.6 C) 98.9 F (37.2 C) 97.8 F (36.6 C)  TempSrc: Oral Oral Oral Oral  SpO2: 94% 96% 96% 97%  Weight:      Height:        Physical Exam Constitutional: no acute distress, difficulty speaking with stuttering and dysphasia, comprehension is largely intact but does have hard time following some commands Head: atraumatic ENT: external ears normal, neck is supple with no pain on range of motion Eyes: EOMI, pupils are asymmetric likely due to prior injury and surgeries Cardiovascular: regular rate and rhythm, normal heart sounds Pulmonary: effort normal, lungs clear to ascultation bilaterally Abdominal: flat, nontender, no rebound tenderness, bowel sounds normal Skin: warm and dry, no foot ulcers, rash over his shins without broken skin Neurological: alert, unable to assess orientation due to difficulty speaking, unable to assess all cranial nerves but face seems symmetric, extremities seem somewhat weak but symmetric throughout, could be due  to his advanced age, negative Romberg Psychiatric: normal mood and affect  DG CHEST PORT 1 VIEW  Result Date: 11/02/2020 CLINICAL DATA:  Fever, possible meningitis EXAM: PORTABLE CHEST 1 VIEW COMPARISON:  None. FINDINGS: Cardiac and mediastinal contours are within normal limits. Atherosclerotic calcifications visualized in the transverse aorta. Scattered nodular opacities bilaterally are nonspecific. Linear airspace opacities in the right lung base most consistent with atelectasis or scarring. No pulmonary edema, pleural effusion or pneumothorax. No convincing evidence of pneumonia. Left reverse shoulder arthroplasty. No acute osseous abnormality. IMPRESSION: 1. Scattered bilateral pulmonary nodules are nonspecific. Recommend further evaluation with noncontrast enhanced chest CT. 2. Right basilar atelectasis versus scarring. 3. Aortic atherosclerosis. Electronically Signed   By: Jacqulynn Cadet M.D.   On: 11/02/2020 08:07   ECHOCARDIOGRAM COMPLETE  Result Date: 11/01/2020    ECHOCARDIOGRAM REPORT   Patient Name:   Caleb Dougherty Date of Exam: 11/01/2020 Medical Rec #:  [id number]       Height:       67.0 in Accession #:    [id number]      Weight:       160.0 lb Date of Birth:  May 24, 1929       BSA:          1.839 m Patient Age:    87 years        BP:           214/94 mmHg Patient Gender: M  HR:           74 bpm. Exam Location:  Inpatient Procedure: 2D Echo, Cardiac Doppler and Color Doppler Indications:    CVA  History:        Patient has no prior history of Echocardiogram examinations.                 Signs/Symptoms:Altered Mental Status; Risk Factors:Hypertension                 and Diabetes.  Sonographer:    Dustin Flock Referring Phys: [place]  1. Left ventricular ejection fraction, by estimation, is 60 to 65%. The left ventricle has normal function. The left ventricle has no regional wall motion abnormalities. Left ventricular diastolic parameters are consistent  with Grade I diastolic dysfunction (impaired relaxation).  2. Right ventricular systolic function is normal. The right ventricular size is normal. There is mildly elevated pulmonary artery systolic pressure.  3. Left atrial size was mildly dilated.  4. The mitral valve is normal in structure. No evidence of mitral valve regurgitation. No evidence of mitral stenosis.  5. The aortic valve is normal in structure. Aortic valve regurgitation is not visualized. No aortic stenosis is present.  6. The inferior vena cava is dilated in size with <50% respiratory variability, suggesting right atrial pressure of 15 mmHg. FINDINGS  Left Ventricle: Left ventricular ejection fraction, by estimation, is 60 to 65%. The left ventricle has normal function. The left ventricle has no regional wall motion abnormalities. The left ventricular internal cavity size was normal in size. There is  no left ventricular hypertrophy. Left ventricular diastolic parameters are consistent with Grade I diastolic dysfunction (impaired relaxation). Indeterminate filling pressures. Right Ventricle: The right ventricular size is normal. No increase in right ventricular wall thickness. Right ventricular systolic function is normal. There is mildly elevated pulmonary artery systolic pressure. The tricuspid regurgitant velocity is 2.82  m/s, and with an assumed right atrial pressure of 15 mmHg, the estimated right ventricular systolic pressure is 89.2 mmHg. Left Atrium: Left atrial size was mildly dilated. Right Atrium: Right atrial size was normal in size. Prominent Eustachian valve. Pericardium: There is no evidence of pericardial effusion. Mitral Valve: The mitral valve is normal in structure. No evidence of mitral valve regurgitation. No evidence of mitral valve stenosis. Tricuspid Valve: The tricuspid valve is normal in structure. Tricuspid valve regurgitation is trivial. No evidence of tricuspid stenosis. Aortic Valve: The aortic valve is normal in  structure. Aortic valve regurgitation is not visualized. No aortic stenosis is present. Pulmonic Valve: The pulmonic valve was normal in structure. Pulmonic valve regurgitation is not visualized. No evidence of pulmonic stenosis. Aorta: The aortic root is normal in size and structure. Venous: The inferior vena cava is dilated in size with less than 50% respiratory variability, suggesting right atrial pressure of 15 mmHg. IAS/Shunts: No atrial level shunt detected by color flow Doppler.  LEFT VENTRICLE PLAX 2D LVIDd:         5.10 cm  Diastology LVIDs:         3.10 cm  LV e' medial:    7.51 cm/s LV PW:         1.10 cm  LV E/e' medial:  10.7 LV IVS:        1.00 cm  LV e' lateral:   6.31 cm/s LVOT diam:     2.70 cm  LV E/e' lateral: 12.7 LV SV:         154 LV  SV Index:   84 LVOT Area:     5.73 cm  RIGHT VENTRICLE RV Basal diam:  3.00 cm RV S prime:     13.40 cm/s TAPSE (M-mode): 2.5 cm LEFT ATRIUM             Index       RIGHT ATRIUM           Index LA diam:        4.00 cm 2.17 cm/m  RA Area:     11.60 cm LA Vol (A2C):   68.5 ml 37.24 ml/m RA Volume:   27.20 ml  14.79 ml/m LA Vol (A4C):   45.7 ml 24.85 ml/m LA Biplane Vol: 60.4 ml 32.84 ml/m  AORTIC VALVE LVOT Vmax:   115.00 cm/s LVOT Vmean:  77.400 cm/s LVOT VTI:    0.269 m  AORTA Ao Root diam: 3.00 cm MITRAL VALVE                TRICUSPID VALVE MV Area (PHT): 5.54 cm     TR Peak grad:   31.8 mmHg MV Decel Time: 137 msec     TR Vmax:        282.00 cm/s MV E velocity: 80.40 cm/s MV A velocity: 110.00 cm/s  SHUNTS MV E/A ratio:  0.73         Systemic VTI:  0.27 m                             Systemic Diam: 2.70 cm Mihai Croitoru MD Electronically signed by Sanda Klein MD Signature Date/Time: 11/01/2020/4:38:24 PM    Final      Assessment/Plan: Caleb Dougherty is a 87 y.o. male with hx of HTN, DM2, HLD presenting with acute encephalopathy and R-sided weakness, also found to have fever and blood pressure 212/85 on arrival. CT and CTA without acute changes, MRI  pending.  Active Problems:   Altered mental status  Acute encephalopathy Fever Had a fever of 102.6 overnight with sustained elevated temperatures.  Infectious work-up pending, but no positive so far.  Urinalysis with hemoglobin but no evidence of infection.  Spot EEG negative for seizure. Covering with Abx for possible meningitis/encephalitis -Vancomycin, ceftriaxone, and ampicillin day 2 -acyclovir day 2 -Follow-up urine culture, blood culture x2 -Neurology following, appreciate recommendations     -Follow-up MRI     -follow-up repeat EEG     -holding off on LP for now -Echo with normal EF, no shunt, dilated IVC with less than 50% auditory variation -hold aspirin -per neuro no need for permissive HTN, reduce by 20% daily  Hypertensive urgency vs emergency  Found to have BP of 210/85 on admission, improved overnight with nitroglycerin drip.  Nitro drip was stopped, goal around 130/80 in the next 24 hours. Home meds of losartan 100mg . -Stop nitro drip -Hold off home losartan given AKI -Start hydralazine if requiring further blood pressure management, but currently at goal  AKI Creatinine 1.39 from 1.0 yesterday, baseline around 1.2.  Could be due to contrast dye, excessive drop in blood pressure. -Holding losartan as above  Elevated troponin, likely demand ischemia Old RBBB and T wave changes Troponin 47 > 97 > 113 > 129 > 130 > 144 > 136. EKG changes were not present earlier this admission but were present in 2020. Spoke with cardiology who did not feel EKG changes warranted further evaluation. -Cardiac monitor  Lung nodules Nonspecific lung nodule seen on  chest x-ray. -Follow-up noncontrast chest CT  Insulin dependent type II diabetes A1c of 8.9.  Home Lantus 18 units daily. -Lantus 20 units daily -Add sliding scale insulin   Diet:  NPO pending speech eval IVF:  none VTE:  SCD Prior to Admission Living Arrangement:  home Anticipated Discharge Location:  TBD Barriers  to Discharge:  Medical workup and HTN management Dispo: Anticipated discharge in approximately 2-3 day(s).   Andrew Au, MD 11/02/2020, 11:16 AM Pager: [phone] After 5pm on weekdays and 1pm on weekends: On Call pager [phone]

## 2020-11-01 NOTE — ED Notes (Signed)
EEG at bedside.

## 2020-11-01 NOTE — Progress Notes (Addendum)
Pharmacy Antibiotic Note  Caleb Dougherty is a 87 y.o. male admitted on 11/01/2020 with acute worsening confusion, now with concern for possible meningitis.  Pharmacy has been consulted for vancomycin/ampicillin/acyclovir dosing.  Plan:  Ceftriaxone 2g IV q12h Ampicillin 2g IV q6h Vancomycin 1500 mg IV x1, then 1250 mg IV q24h (goal trough 15-20)  Acyclovir 10 mg/kg IV q12h - F/u clinical status, LOT, de-escalation - Monitor renal function and need for dose adjustment   - F/u decision on LP  Height: 5\' 7"  (170.2 cm) Weight: 72.6 kg (160 lb) IBW/kg (Calculated) : 66.1  Temp (24hrs), Avg:98.7 F (37.1 C), Min:98.7 F (37.1 C), Max:98.7 F (37.1 C)  Recent Labs  Lab 11/01/20 0918 11/01/20 0924  WBC 6.4  --   CREATININE 1.23 1.10    Estimated Creatinine Clearance: 42.6 mL/min (by C-G formula based on SCr of 1.1 mg/dL).    Allergies  Allergen Reactions  . Gabapentin   . Indomethacin     Other reaction(s): Edema of lower extremity  . Lisinopril Cough  . Nortriptyline     Other reaction(s): Edema of lower extremity  . Empagliflozin     Other reaction(s): Urinary incontinence    Antimicrobials this admission: Ampicillin 4/5 >> Ceftriaxone 4/5 >>  Vancomycin 4/5 >>  Acyclovir 4/5 >>    Thank you for allowing pharmacy to be a part of this patient's care.  Claudina Lick, PharmD PGY1 Acute Care Pharmacy Resident  11/01/2020 2:55 PM  Please check [email or web address] for unit-specific pharmacy phone numbers.

## 2020-11-02 ENCOUNTER — Inpatient Hospital Stay (HOSPITAL_COMMUNITY): Payer: Medicare Other

## 2020-11-02 DIAGNOSIS — I10 Essential (primary) hypertension: Secondary | ICD-10-CM

## 2020-11-02 DIAGNOSIS — I161 Hypertensive emergency: Principal | ICD-10-CM

## 2020-11-02 DIAGNOSIS — R778 Other specified abnormalities of plasma proteins: Secondary | ICD-10-CM

## 2020-11-02 DIAGNOSIS — R509 Fever, unspecified: Secondary | ICD-10-CM

## 2020-11-02 DIAGNOSIS — I160 Hypertensive urgency: Secondary | ICD-10-CM

## 2020-11-02 DIAGNOSIS — R911 Solitary pulmonary nodule: Secondary | ICD-10-CM

## 2020-11-02 DIAGNOSIS — R4182 Altered mental status, unspecified: Secondary | ICD-10-CM

## 2020-11-02 DIAGNOSIS — E440 Moderate protein-calorie malnutrition: Secondary | ICD-10-CM | POA: Insufficient documentation

## 2020-11-02 DIAGNOSIS — E119 Type 2 diabetes mellitus without complications: Secondary | ICD-10-CM

## 2020-11-02 DIAGNOSIS — Z794 Long term (current) use of insulin: Secondary | ICD-10-CM

## 2020-11-02 DIAGNOSIS — N179 Acute kidney failure, unspecified: Secondary | ICD-10-CM

## 2020-11-02 DIAGNOSIS — I248 Other forms of acute ischemic heart disease: Secondary | ICD-10-CM

## 2020-11-02 LAB — COMPREHENSIVE METABOLIC PANEL
ALT: 18 U/L (ref 0–44)
AST: 19 U/L (ref 15–41)
Albumin: 3.1 g/dL — ABNORMAL LOW (ref 3.5–5.0)
Alkaline Phosphatase: 25 U/L — ABNORMAL LOW (ref 38–126)
Anion gap: 11 (ref 5–15)
BUN: 28 mg/dL — ABNORMAL HIGH (ref 8–23)
CO2: 22 mmol/L (ref 22–32)
Calcium: 8.4 mg/dL — ABNORMAL LOW (ref 8.9–10.3)
Chloride: 106 mmol/L (ref 98–111)
Creatinine, Ser: 1.39 mg/dL — ABNORMAL HIGH (ref 0.61–1.24)
GFR, Estimated: 48 mL/min — ABNORMAL LOW (ref >60)
Glucose, Bld: 281 mg/dL — ABNORMAL HIGH (ref 70–99)
Potassium: 3.9 mmol/L (ref 3.5–5.1)
Sodium: 139 mmol/L (ref 135–145)
Total Bilirubin: 1.2 mg/dL (ref 0.3–1.2)
Total Protein: 5.6 g/dL — ABNORMAL LOW (ref 6.5–8.1)

## 2020-11-02 LAB — CBC
HCT: 32 % — ABNORMAL LOW (ref 39.0–52.0)
Hemoglobin: 10.7 g/dL — ABNORMAL LOW (ref 13.0–17.0)
MCH: 30.4 pg (ref 26.0–34.0)
MCHC: 33.4 g/dL (ref 30.0–36.0)
MCV: 90.9 fL (ref 80.0–100.0)
Platelets: 161 10*3/uL (ref 150–400)
RBC: 3.52 MIL/uL — ABNORMAL LOW (ref 4.22–5.81)
RDW: 13.4 % (ref 11.5–15.5)
WBC: 9.5 10*3/uL (ref 4.0–10.5)
nRBC: 0 % (ref 0.0–0.2)

## 2020-11-02 LAB — HEMOGLOBIN A1C
Hgb A1c MFr Bld: 8.9 % — ABNORMAL HIGH (ref 4.8–5.6)
Mean Plasma Glucose: 208.73 mg/dL

## 2020-11-02 LAB — LIPID PANEL
Cholesterol: 125 mg/dL (ref 0–200)
HDL: 44 mg/dL (ref >40)
LDL Cholesterol: 68 mg/dL (ref 0–99)
Total CHOL/HDL Ratio: 2.8 RATIO
Triglycerides: 65 mg/dL (ref <150)
VLDL: 13 mg/dL (ref 0–40)

## 2020-11-02 LAB — GLUCOSE, CAPILLARY
Glucose-Capillary: 242 mg/dL — ABNORMAL HIGH (ref 70–99)
Glucose-Capillary: 148 mg/dL — ABNORMAL HIGH (ref 70–99)
Glucose-Capillary: 178 mg/dL — ABNORMAL HIGH (ref 70–99)
Glucose-Capillary: 157 mg/dL — ABNORMAL HIGH (ref 70–99)

## 2020-11-02 LAB — TROPONIN I (HIGH SENSITIVITY)
Troponin I (High Sensitivity): 129 ng/L (ref <18)
Troponin I (High Sensitivity): 130 ng/L (ref <18)
Troponin I (High Sensitivity): 144 ng/L (ref <18)
Troponin I (High Sensitivity): 136 ng/L (ref <18)

## 2020-11-02 MED ORDER — HYDROCHLOROTHIAZIDE 25 MG PO TABS: 25.0000 mg | ORAL_TABLET | Freq: Every day | ORAL | Status: DC

## 2020-11-02 MED ORDER — INSULIN ASPART 100 UNIT/ML ~~LOC~~ SOLN
0.0000 [IU] | Freq: Three times a day (TID) | SUBCUTANEOUS | Status: DC
  Administered 2020-11-02: 2 [IU] via SUBCUTANEOUS
  Administered 2020-11-02: 3 [IU] via SUBCUTANEOUS
  Administered 2020-11-02: 1 [IU] via SUBCUTANEOUS
  Administered 2020-11-03: 3 [IU] via SUBCUTANEOUS
  Administered 2020-11-03 (×2): 1 [IU] via SUBCUTANEOUS
  Administered 2020-11-04: 3 [IU] via SUBCUTANEOUS
  Administered 2020-11-04: 7 [IU] via SUBCUTANEOUS

## 2020-11-02 MED ORDER — HYDRALAZINE HCL 10 MG PO TABS
10.0000 mg | ORAL_TABLET | Freq: Three times a day (TID) | ORAL | Status: DC
  Administered 2020-11-02 – 2020-11-03 (×3): 10 mg via ORAL
  Filled 2020-11-02 (×3): qty 1

## 2020-11-02 NOTE — Evaluation (Signed)
Clinical/Bedside Swallow Evaluation Patient Details  Name: Caleb Dougherty MRN: [id number] Date of Birth: 02/12/1929  Today's Date: 11/02/2020 Time: SLP Start Time (ACUTE ONLY): 30 SLP Stop Time (ACUTE ONLY): 1135 SLP Time Calculation (min) (ACUTE ONLY): 12 min  Past Medical History:  Past Medical History:  Diagnosis Date  . Arthritis    osteoarthritis-knees and feet  . BPH (benign prostatic hypertrophy)   . [facility])    melanoma(back)-no futher issues. Skin cancers face "frozen"  . Cataract    02-12-14 left- ready for surgery, but only has had right done  . Diabetes mellitus without complication ([place])    diabetes x4 yrs-oral med and Lantus used  . HOH (hard of hearing)    wears bilateral hearing aids  . Hypertension   . Impaired hearing    hard of hearing -no hearing aids  . Urgency of urination   . Wears dentures    full lower  . Wears glasses    reading   Past Surgical History:  Past Surgical History:  Procedure Laterality Date  . CARPAL TUNNEL RELEASE Right 06/23/2013   Procedure: RIGHT CARPAL TUNNEL RELEASE;  Surgeon: Cammie Sickle., MD;  Location: [facility];  Service: Orthopedics;  Laterality: Right;  . CATARACT EXTRACTION Right   . CATARACT EXTRACTION W/PHACO Left 03/30/2019   Procedure: CATARACT EXTRACTION PHACO AND INTRAOCULAR LENS PLACEMENT (IOC) LEFT DIABETES  02:21.8  19.2%  28.18;  Surgeon: Eulogio Bear, MD;  Location: [place];  Service: Ophthalmology;  Laterality: Left;  diabetic - insulin  . COLONOSCOPY    . INCISION AND DRAINAGE WOUND WITH FOREIGN BODY REMOVAL Left 12/07/2014   Procedure: INCISION AND DRAINAGE WOUND WITH FOREIGN BODY REMOVAL;  Surgeon: Corky Mull, MD;  Location: [facility];  Service: Orthopedics;  Laterality: Left;  . REVERSE SHOULDER ARTHROPLASTY Left 11/19/2014   Procedure: LEFT REVERSE SHOULDER ARTHROPLASTY;  Surgeon: Netta Cedars, MD;  Location: [place] [facility];  Service: Orthopedics;  Laterality: Left;  .  TOTAL KNEE ARTHROPLASTY Left 02/23/2014   Procedure: LEFT TOTAL KNEE REPLACEMENT;  Surgeon: Mauri Pole, MD;  Location: [facility];  Service: Orthopedics;  Laterality: Left;  . TOTAL KNEE ARTHROPLASTY Right 05/25/2014   Procedure: RIGHT TOTAL KNEE ARTHROPLASTY;  Surgeon: Mauri Pole, MD;  Location: [facility];  Service: Orthopedics;  Laterality: Right;   HPI:  Pt is an 87 y.o. male presenting [facility] ED on 11/01/2020 as a code stroke from home. Pt with R weakness, L gaze. CT imaging is unremarkable. CXR 4/6: Scattered bilateral pulmonary nodules are nonspecific. PMH includes HOH, HTN, and type II DM.   Assessment / Plan / Recommendation Clinical Impression  Pt presenting with signs of receptive-expressive aphasia upon arrival. With SLP encouragement, he was agreeable to a brief swallow evaluation. No overt s/s of aspiration were noted during PO trials; however, delayed throat clearing occurred following all trials. Given mentation and that PO trials were very limited, recommend a DYS 2 diet and thin liquids with full supervision from staff. SLP will continue to follow for further assessment of his oropharyngeal swallow and completion of speech/language evaluation.  SLP Visit Diagnosis: Dysphagia, unspecified (R13.10)    Aspiration Risk  Mild aspiration risk    Diet Recommendation Dysphagia 2 (Fine chop);Thin liquid   Liquid Administration via: Cup;Straw Medication Administration: Crushed with puree Supervision: Staff to assist with self feeding;Full supervision/cueing for compensatory strategies Compensations: Minimize environmental distractions;Slow rate;Small sips/bites Postural Changes: Seated upright at 90 degrees  Other  Recommendations Oral Care Recommendations: Oral care BID   Follow up Recommendations Inpatient Rehab      Frequency and Duration min 2x/week  2 weeks       Prognosis Prognosis for Safe Diet Advancement: Good Barriers to Reach Goals: Cognitive deficits;Language  deficits      Swallow Study   General HPI: Pt is an 87 y.o. male presenting to [facility] ED on 11/01/2020 as a code stroke from home. Pt with R weakness, L gaze. CT imaging is unremarkable. CXR 4/6: Scattered bilateral pulmonary nodules are nonspecific. PMH includes HOH, HTN, and type II DM. Type of Study: Bedside Swallow Evaluation Previous Swallow Assessment: None in chart Diet Prior to this Study: NPO Temperature Spikes Noted: No Respiratory Status: Room air History of Recent Intubation: No Behavior/Cognition: Alert;Requires cueing Oral Cavity Assessment: Within Functional Limits Oral Care Completed by SLP: No Oral Cavity - Dentition: Missing dentition Vision: Functional for self-feeding Self-Feeding Abilities: Able to feed self Patient Positioning: Upright in chair Baseline Vocal Quality: Normal Volitional Cough: Cognitively unable to elicit Volitional Swallow: Unable to elicit (Cognitively unable to elicit)    Oral/Motor/Sensory Function Overall Oral Motor/Sensory Function: Other (comment) (Facial and lingual symmetry, good lip seal but others unable to assess due to difficulty following commands)   Ice Chips Ice chips: Impaired Presentation: Spoon Pharyngeal Phase Impairments: Throat Clearing - Delayed   Thin Liquid Thin Liquid: Impaired Presentation: Cup;Straw;Self Fed Pharyngeal  Phase Impairments: Throat Clearing - Delayed    Nectar Thick Nectar Thick Liquid: Not tested   Honey Thick Honey Thick Liquid: Not tested   Puree Puree: Impaired Presentation: Spoon Pharyngeal Phase Impairments: Throat Clearing - Delayed   Solid     Solid: Impaired Presentation: Self Fed Pharyngeal Phase Impairments: Throat Clearing - Delayed      Jeanine Luz., SLP Student 11/02/2020,12:36 PM

## 2020-11-02 NOTE — Progress Notes (Signed)
Inpatient Diabetes Program Recommendations  AACE/ADA: New Consensus Statement on Inpatient Glycemic Control (2015)  Target Ranges:  Prepandial:   less than 140 mg/dL      Peak postprandial:   less than 180 mg/dL (1-2 hours)      Critically ill patients:  140 - 180 mg/dL   Lab Results  Component Value Date   GLUCAP 242 (H) 11/02/2020   HGBA1C 8.9 (H) 11/02/2020    Review of Glycemic Control  Diabetes history: DM2 Outpatient Diabetes medications: Lantus 18 units QD, metformin 1000 mg BID Current orders for Inpatient glycemic control: Lantus 20 units QAM, Novolog 0-9 units TID  HgbA1C - 8.9%  Inpatient Diabetes Program Recommendations:     Increase Lantus to 22 units QAM  Continue to follow trends.   Thank you. Lorenda Peck, RD, LDN, CDE Inpatient Diabetes Coordinator [phone]

## 2020-11-02 NOTE — Progress Notes (Signed)
Initial Nutrition Assessment  DOCUMENTATION CODES:   Non-severe (moderate) malnutrition in context of chronic illness  INTERVENTION:  - diet advancement as medically feasible.  NUTRITION DIAGNOSIS:   Moderate Malnutrition related to chronic illness (arthritis, cancer) as evidenced by mild fat depletion,mild muscle depletion.  GOAL:   Patient will meet greater than or equal to 90% of their needs  MONITOR:   Diet advancement,Weight trends  REASON FOR ASSESSMENT:   Malnutrition Screening Tool  ASSESSMENT:   87 year-old male with medical history of HTN, arthritis, cancer, and type 2 DM. he presented to the ED due to AMS, weakness, and aphasia.  He has been NPO since admission. Patient sitting in the chair and sleeping with no family or visitors present. He did not awake, even during NFPE. He is noted to be a/o to self only.  He has not been seen by a  RD at any time in the past.  Weight yesterday was 160 lb, which appears to be a stated weight, and PTA the most recently documented weight was 173 lb on 03/30/19. This indicates 13 lb weight loss (7.5% body weight) in the past 19 months.   Note in [facility] report states that it was reported that patient has been losing weight over the past 1 year.  Weight at [facility] on 10/26/20 was 157 lb.   Per notes: - Neurology following  - pending repeat EEG - pending SLP evaluation prior to diet advancement - acute encephalopathy - hypertensive urgency/hypertensive emergency - AKI - lung nodules    Labs reviewed; HgbA1c: 8.9%, CBG: 242 mg/dl, BUN: 28 mg/dl, creatinine: 1.39 mg/dl, Ca: 8.4 mg/dl, GFR: 48 ml/min.  Medications reviewed; sliding scale novolog, 20 units lantus/morning.    NUTRITION - FOCUSED PHYSICAL EXAM:  Flowsheet Row Most Recent Value  Orbital Region Unable to assess  Upper Arm Region Mild depletion  Thoracic and Lumbar Region Unable to assess  Buccal Region Mild depletion  Temple Region No depletion   Clavicle Bone Region Mild depletion  Clavicle and Acromion Bone Region Mild depletion  Scapular Bone Region Unable to assess  Dorsal Hand Mild depletion  Patellar Region Mild depletion  Anterior Thigh Region Unable to assess  Posterior Calf Region Mild depletion  Edema (RD Assessment) None  Hair Reviewed  Eyes Unable to assess  Mouth Unable to assess  Skin Reviewed  Nails Reviewed       Diet Order:   Diet Order            Diet NPO time specified  Diet effective now                 EDUCATION NEEDS:   Not appropriate for education at this time  Skin:  Skin Assessment: Reviewed RN Assessment  Last BM:  4/5  Height:   Ht Readings from Last 1 Encounters:  11/01/20 5\' 8"  (1.727 m)    Weight:   Wt Readings from Last 1 Encounters:  11/01/20 72.6 kg     Estimated Nutritional Needs:  Kcal:  1700-1900 kcal Protein:  80-90 grams Fluid:  >/= 1.8 L/day     Jarome Matin, MS, RD, LDN, CNSC Inpatient Clinical Dietitian RD pager # available in AMION  After hours/weekend pager # available in [facility]

## 2020-11-02 NOTE — Progress Notes (Addendum)
NEUROLOGY CONSULTATION PROGRESS NOTE   Date of service: November 02, 2020 Patient Name: Caleb Dougherty MRN:  [id number] DOB:  01-25-1929  Brief HPI  Caleb Dougherty is a 87 y.o. male with PMH significant for  has a past medical history of Arthritis, BPH (benign prostatic hypertrophy), Cancer ([place]), Cataract, Diabetes mellitus without complication ([place]), HOH (hard of hearing), Hypertension, Impaired hearing, Urgency of urination, Wears dentures, and Wears glasses. who presents with Left sided weakness and facial droop. LKW at 2100 on 4/4. At that time he went to bed. He woke up and went to the bathroom but afterwards was unable to get back to bed due to weakness. The wife reports he got up around 7 AM to use the bathroom, however, per family she has dementia and so cannot rely on her time frame.   Interval Hx   He has had significant improvement since yesterday. He is able to answer some commands today and is able to follow commands. His son is at beside and that the patient is much improved. He did have a fever yesterday afternoon with Tmax of 102.9. BP has been well controlled. Vitals   Vitals:   11/01/20 2329 11/02/20 0126 11/02/20 0300 11/02/20 0747  BP: (!) 141/59 134/61 139/61 137/61  Pulse: 80 66 60 65  Resp: 15 16 15 17   Temp: 98.2 F (36.8 C) 99.7 F (37.6 C) 98.9 F (37.2 C) 97.8 F (36.6 C)  TempSrc: Oral Oral Oral Oral  SpO2: 94% 96% 96% 97%  Weight:      Height:         Body mass index is 24.33 kg/m.  Physical Exam   General: Laying comfortably in bed; in no acute distress.  HENT: Normal oropharynx and mucosa. Normal external appearance of ears and nose.  Neck: Supple, no pain or tenderness  CV: No JVD. No peripheral edema.  Pulmonary: Symmetric Chest rise. Normal respiratory effort.  Abdomen: Soft to touch, non-tender.  Ext: No cyanosis, edema, or deformity  Skin: No rash. Normal palpation of skin.   Musculoskeletal: Normal digits and nails by inspection. No  clubbing.   Neurologic Examination  Mental status/Cognition: Alert, oriented to self and year, diminished attention. Able to follow commands occasionally requiring multiple promtps. Speech/language: Fluent, comprehension intact, voice is soft and dysarthric Cranial nerves:   CN II Pupils equal and reactive to light, no VF deficits    CN III,IV,VI EOM intact, no gaze preference or deviation, no nystagmus    CN V normal sensation in V1, V2, and V3 segments bilaterally    CN VII no asymmetry, no nasolabial fold flattening    CN VIII normal hearing to speech    CN IX & X normal palatal elevation, no uvular deviation    CN XI 5/5 head turn and 5/5 shoulder shrug bilaterally    CN XII midline tongue protrusion    Motor:  Able to lift all 4 against gravity but on outstretched arm there is asterixis.  Both upper extremities are at least 4+/5.  Both lower extremities are also 4+/5.  Sensation: Intact to light touch Coordination and gait difficult to assess.  Labs   Basic Metabolic Panel:  Lab Results  Component Value Date   NA 139 11/02/2020   K 3.9 11/02/2020   CO2 22 11/02/2020   GLUCOSE 281 (H) 11/02/2020   BUN 28 (H) 11/02/2020   CREATININE 1.39 (H) 11/02/2020   CALCIUM 8.4 (L) 11/02/2020   GFRNONAA 48 (L) 11/02/2020  GFRAA 55 (L) 11/20/2014   HbA1c:  Lab Results  Component Value Date   HGBA1C 8.9 (H) 11/02/2020   LDL:  Lab Results  Component Value Date   LDLCALC 68 11/02/2020   Urine Drug Screen: No results found for: LABOPIA, COCAINSCRNUR, LABBENZ, AMPHETMU, THCU, LABBARB  Alcohol Level No results found for: ETH No results found for: PHENYTOIN, ZONISAMIDE, LAMOTRIGINE, LEVETIRACETA No results found for: PHENYTOIN, PHENOBARB, VALPROATE, CBMZ  Imaging and Diagnostic studies  Results for orders placed during the hospital encounter of 11/01/20  ECHOCARDIOGRAM COMPLETE IMPRESSIONS 1. Left ventricular ejection fraction, by estimation, is 60 to 65%. The left ventricle  has normal function. The left ventricle has no regional wall motion abnormalities. Left ventricular diastolic parameters are consistent with Grade I diastolic dysfunction (impaired relaxation). 2. Right ventricular systolic function is normal. The right ventricular size is normal. There is mildly elevated pulmonary artery systolic pressure. 3. Left atrial size was mildly dilated. 4. The mitral valve is normal in structure. No evidence of mitral valve regurgitation. No evidence of mitral stenosis. 5. The aortic valve is normal in structure. Aortic valve regurgitation is not visualized. No aortic stenosis is present. 6. The inferior vena cava is dilated in size with <50% respiratory variability, suggesting right atrial pressure of 15 mmHg.   Impression   Caleb Dougherty is a 87 y.o. male with PMH significant for HTN, diabetes and some cognitive decline. His neurologic examination is notable for significant improvement since yesterday. Able to follow commands and answer some questions today. Able to move all limbs and hold them up.  Mild asterixis on outstretched arms.  Ammonia yesterday normal.  Might be related to mild derangements in his renal function or tremors at baseline. Started on empiric meningitic/encephalitic coverage yesterday due to unclear source of fever.  Source of fever still remains elusive. Blood cultures and urine cultures pending.  Urinalysis unremarkable. Differential still remain hypertensive emergency versus CNS infection although such a drastic onset in such a drastic improvement only after 12 hours of antibiotics/antivirals for meningitic/encephalitic coverage makes me but the CNS infection noted on the differentials. Seizures could also be considered as one of the differentials, yesterday's EEG was slowing only.  We will repeat EEG today.  Impression Sudden onset of aphasia and right-sided weakness concerning for stroke with negative CT and CTA and CT perfusion. EEG not  concerning for ongoing seizure activity Seizures do remain in the differential CNS infection lower on the differential Hypertensive emergency-high on the differentials  Recommendations  -Repeat EEG-hopeless to see any focal slowing which might point towards any focal abnormality and make the presentation more consistent with seizure. -No AEDs for now -MRI W/WO Contrast ordered-he has a foreign body in his scalp-discussed with neuroradiology-okay to proceed as long as patient can inform the techs if there is any discomfort while getting the MRI. -Continue Infectious Workup per Primary Service -Hold off on LP for now. -Continue Diabetic Management per SSI -Continue BP control per HTN Emergency guidelines goal is normotensive. Reduce by 20% daily. -I would continue the antibiotic/antiviral coverage for now. -Keep NPO until SLP evaluation ______________________________________________________________________   Fatima Sanger MD Resident   Attending Neurohospitalist Addendum Patient seen and examined with APP/Resident. Agree with the history and physical as documented above. Agree with the plan as documented, which I helped formulate. I have independently reviewed the chart, obtained history, review of systems and examined the patient.I have personally reviewed pertinent head/neck/spine imaging (CT/MRI). Please feel free to call with any questions.  --  Amie Portland, MD Neurologist [facility] Pager: [phone]

## 2020-11-02 NOTE — Progress Notes (Signed)
EEG complete - results pending 

## 2020-11-02 NOTE — Evaluation (Signed)
Physical Therapy Evaluation Patient Details Name: Caleb Dougherty MRN: [id number] DOB: Aug 29, 1928 Today's Date: 11/02/2020   History of Present Illness  87 y.o. male presenting to [facility] ED on 11/01/2020 as a code stroke from home. Pt with R weakness, L gaze. CT imaging is unremarkable. PMH includes HTN and type II DM.  Clinical Impression  Pt presents to PT with deficits in functional mobility, gait, balance, endurance, strength, power, motor planning and coordination, and communication. Pt demonstrates both expressive and receptive aphasia qualities during session which makes communication difficult at times, but is able to respond fairly consistently to yes/no questions. Pt demonstrates significant motor planning deficits which place the pt at high falls risk with any out of bed mobility. Pt will benefit from aggressive mobilization and continued acute PT POC to improve mobility quality and reduce falls risk. PT recommends CIR placement as the pt was independent prior to this recent decline and demonstrates the potential to make significant functional gains with high intensity inpatient PT services.    Follow Up Recommendations CIR    Equipment Recommendations  Wheelchair (measurements PT);Rolling walker with 5" wheels;3in1 (PT)    Recommendations for Other Services Rehab consult     Precautions / Restrictions Precautions Precautions: Fall Precaution Comments: aphasia Restrictions Weight Bearing Restrictions: No      Mobility  Bed Mobility Overal bed mobility: Needs Assistance Bed Mobility: Supine to Sit     Supine to sit: Min assist;HOB elevated     General bed mobility comments: use of railing    Transfers Overall transfer level: Needs assistance Equipment used: 1 person hand held assist;Rolling walker (2 wheeled) Transfers: Sit to/from [facility] Sit to Stand: Mod assist Stand pivot transfers: Mod assist       General transfer comment: pt with  significant motor planning deficits, requires PT cues for sequencing and hand placement, pt with difficulty advancing RLE when pivoting toward recliner.  Ambulation/Gait Ambulation/Gait assistance: Min assist;Mod assist Gait Distance (Feet): 2 Feet Assistive device: Rolling walker (2 wheeled) Gait Pattern/deviations: Step-to pattern Gait velocity: reduced Gait velocity interpretation: <1.31 ft/sec, indicative of household ambulator General Gait Details: pt with short steps, requiring PT cues and some physical assistance to weight shift left to clear R foot initially. Multiple posterior losses of balance when backing up to recliner  Stairs            Wheelchair Mobility    Modified Rankin (Stroke Patients Only)       Balance Overall balance assessment: Needs assistance Sitting-balance support: Single extremity supported;Feet supported Sitting balance-Leahy Scale: Poor     Standing balance support: Single extremity supported;Bilateral upper extremity supported Standing balance-Leahy Scale: Poor Standing balance comment: min-modA with BUE support of RW                             Pertinent Vitals/Pain Pain Assessment: No/denies pain    Home Living Family/patient expects to be discharged to:: Private residence Living Arrangements: Spouse/significant other (spouse has dementia) Available Help at Discharge: Family;Available PRN/intermittently Type of Home: House Home Access: Ramped entrance     Home Layout: One level Home Equipment: Cane - single point;Wheelchair - manual      Prior Function Level of Independence: Independent with assistive device(s)         Comments: pt ambulates in the home without device, utilizies cane in community on uneven surfaces, driving, likes to do [employer]  Dominant Hand: Right    Extremity/Trunk Assessment   Upper Extremity Assessment Upper Extremity Assessment: Defer to OT evaluation     Lower Extremity Assessment Lower Extremity Assessment: Generalized weakness;RLE deficits/detail;LLE deficits/detail RLE Coordination: decreased gross motor LLE Coordination: decreased gross motor    Cervical / Trunk Assessment Cervical / Trunk Assessment: Normal  Communication   Communication: Receptive difficulties;Expressive difficulties  Cognition Arousal/Alertness: Awake/alert Behavior During Therapy: WFL for tasks assessed/performed Overall Cognitive Status: Difficult to assess                                 General Comments: pt with expressive/receptive aphasia. Pt responds to yes/no questions correctly ~75% of time with increased time to respond. Pt follows one step commands with increased time consistently. Pt with difficulty following multi-step commands. Pt with reduced awareness of safety at this time.      General Comments General comments (skin integrity, edema, etc.): VSS on RA, BP in 150s. TAVWPVXY/80-16 diastolic    Exercises     Assessment/Plan    PT Assessment Patient needs continued PT services  PT Problem List Decreased strength;Decreased activity tolerance;Decreased balance;Decreased mobility;Decreased coordination;Decreased cognition;Decreased knowledge of use of DME;Decreased safety awareness;Decreased knowledge of precautions       PT Treatment Interventions DME instruction;Gait training;Functional mobility training;Therapeutic activities;Therapeutic exercise;Balance training;Neuromuscular re-education;Cognitive remediation;Patient/family education    PT Goals (Current goals can be found in the Care Plan section)  Acute Rehab PT Goals Patient Stated Goal: to improve mobility and get home so he can assist in caring for his spouse PT Goal Formulation: With patient Time For Goal Achievement: 11/16/20 Potential to Achieve Goals: Good    Frequency Min 4X/week   Barriers to discharge        Co-evaluation               AM-PAC  PT "6 Clicks" Mobility  Outcome Measure Help needed turning from your back to your side while in a flat bed without using bedrails?: A Little Help needed moving from lying on your back to sitting on the side of a flat bed without using bedrails?: A Little Help needed moving to and from a bed to a chair (including a wheelchair)?: A Lot Help needed standing up from a chair using your arms (e.g., wheelchair or bedside chair)?: A Lot Help needed to walk in hospital room?: A Lot Help needed climbing 3-5 steps with a railing? : Total 6 Click Score: 13    End of Session   Activity Tolerance: Patient tolerated treatment well Patient left: in chair;with call bell/phone within reach;with chair alarm set;with family/visitor present Nurse Communication: Mobility status PT Visit Diagnosis: Unsteadiness on feet (R26.81);Other abnormalities of gait and mobility (R26.89);Muscle weakness (generalized) (M62.81);Other symptoms and signs involving the nervous system (R29.898)    Time: 5537-4827 PT Time Calculation (min) (ACUTE ONLY): 39 min   Charges:   PT Evaluation $PT Eval Low Complexity: 1 Low PT Treatments $Therapeutic Activity: 8-22 mins        Zenaida Niece, PT, DPT Acute Rehabilitation Pager: [phone]   Zenaida Niece 11/02/2020, 9:44 AM

## 2020-11-02 NOTE — Progress Notes (Addendum)
Patient was unable to complete his MRI earlier today, stating "I want to do this."  After speaking with son, the patient said something about his PTSD.  He is still having speech difficulty, so not able to communicate completely what the issue was.  Patient felt that he would be able to complete the MRI if his son was there with him.  Sedation is an option for him because he has a metal fragment in his scalp, and we needed know that he can communicate any discomfort to safely perform this.  Spoke with radiology technician who states that the radiologist feels it is unsafe for him to have this done while he is not overtly alert and oriented, concerned that he may not able to communicate about pain or discomfort from the metal fragment.  At this time will postpone the MRI, may revisit as his communication ability improves.

## 2020-11-02 NOTE — Procedures (Addendum)
Patient Name: Caleb Dougherty  MRN: [id number]  Epilepsy Attending: Lora Havens  Referring Physician/Provider: Dr Edison Simon Date: 11/02/2020 Duration: 31.24 mins  Patient history: 87 year old male with new onset sudden speech disturbance.  EEG to evaluate for seizures.  Level of alertness: Awake, asleep  AEDs during EEG study: None  Technical aspects: This EEG study was done with scalp electrodes positioned according to the 10-20 International system of electrode placement. Electrical activity was acquired at a sampling rate of 500Hz  and reviewed with a high frequency filter of 70Hz  and a low frequency filter of 1Hz . EEG data were recorded continuously and digitally stored.   Description: No posterior dominant rhythm was seen. Sleep was characterized by sleep spindles (12-14hz ), maximal frontocentral region. EEG showed continuous generalized 3 to 6 Hz theta-delta slowing. Hyperventilation and photic stimulation were not performed.    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 No seizures or epileptiform discharges were seen throughout the recording.  If concern for ictal-interictal activity persists, consider long term monitoring.    Lanijah Warzecha Barbra Sarks

## 2020-11-02 NOTE — Progress Notes (Signed)
Pt states " I don't want to do this, " after having the exam explained to him. Pt A/O. Pt refusing exam.

## 2020-11-02 NOTE — Evaluation (Signed)
Occupational Therapy Evaluation Patient Details Name: Caleb Dougherty MRN: [id number] DOB: 09/24/1928 Today's Date: 11/02/2020    History of Present Illness 87 y.o. male presenting to [facility] ED on 11/01/2020 as a code stroke from home. Pt with R weakness, L gaze. CT imaging is unremarkable. PMH includes HTN and type II DM.   Clinical Impression   Pt admitted to ED due to increased weakness, aphasia, and AMS. PTA pt family reported that the past 2 weeks he has been feeling weaker and not like himself, he stopped driving, started using a Rollator, and complaining of fatigue. 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 At the time of the evaluation, pt was demonstrating expressive and receptive aphasia qualities, with difficulties responding to questions and following multistep commands. Additionally, pt required max assist for all functional mobility due to motor planning difficulties. Pt is able to complete tasks that he is familiar with such as washing his face, however demonstrates difficulties with tasks he's not used to such as donning/doffing a hospital gown. Pt will benefit from CIR to continue therapies intensively. OT will continue to follow acutely.       Follow Up Recommendations  CIR;Supervision/Assistance - 24 hour    Equipment Recommendations  None recommended by OT    Recommendations for Other Services Rehab consult     Precautions / Restrictions Precautions Precautions: Fall Precaution Comments: aphasia Restrictions Weight Bearing Restrictions: No      Mobility Bed Mobility Overal bed mobility: Needs Assistance Bed Mobility: Supine to Sit     Supine to sit: Min guard;HOB elevated     General bed mobility comments: use of railing    Transfers Overall transfer level: Needs assistance Equipment used: Rolling walker (2 wheeled) Transfers: Sit to/from  [facility] Sit to Stand: Mod assist Stand pivot transfers: Max assist       General transfer comment: pt with significant motor planning deficits, requires OT cues for sequencing and hand placement, pt with difficulty advancing toward recliner.    Balance Overall balance assessment: Needs assistance Sitting-balance support: Feet supported Sitting balance-Leahy Scale: Fair     Standing balance support: Single extremity supported;During functional activity;Bilateral upper extremity supported Standing balance-Leahy Scale: Poor Standing balance comment: min-modA with BUE support of RW                           ADL either performed or assessed with clinical judgement   ADL Overall ADL's : Needs assistance/impaired Eating/Feeding: Minimal assistance;Sitting Eating/Feeding Details (indicate cue type and reason): Pt has difficulties bringing cup to his mouth initially. OT assisted 2x, then pt was able to complete on his own. Grooming: Dance movement psychotherapist;Set up;Sitting Grooming Details (indicate cue type and reason): Pt was able to wash face and head with wash cloth sitting EOB         Upper Body Dressing : Minimal assistance;Sitting Upper Body Dressing Details (indicate cue type and reason): Pt began attempting to doff gown by pulling it, when OT assisted donning gown, pt required tactile cues with where to put his hands through the arms of the gown. Lower Body Dressing: Maximal assistance Lower Body Dressing Details (indicate cue type and reason): Pt unable to reach feet to put socks on or initially place underwear on. Toilet Transfer: Maximal Systems analyst Details (indicate cue type and reason): Pt  having difficulties once standing with motor planning to move forward and pivot to sit on BSC. Pt demonstrating a shuffle gait, and taking his hands off the walker, looking around.         Functional mobility during ADLs: Maximal  assistance;Rolling walker General ADL Comments: Pt demonstrating difficulty with motor planning/ataxia with ambulation. When standing, pt continued to let go of the walker and look around, attempting to take shuffled steps forward.     Vision Baseline Vision/History: Wears glasses Wears Glasses: Reading only Vision Assessment?: No apparent visual deficits Additional Comments: Would benefit from further vision assessment to ruleout any visual deficits.     Perception Perception Perception Tested?: No   Praxis Praxis Praxis tested?: Not tested    Pertinent Vitals/Pain Pain Assessment: No/denies pain     Hand Dominance Right   Extremity/Trunk Assessment Upper Extremity Assessment Upper Extremity Assessment: RUE deficits/detail;LUE deficits/detail;Generalized weakness RUE Deficits / Details: Pt has difficulties grasping and motor planning fine motor movements. Pt can complete gross motor grasps with decreased strength. RUE Sensation: WNL RUE Coordination: decreased fine motor;decreased gross motor LUE Deficits / Details: Pt has difficulties grasping and motor planning fine motor movements. Pt can complete gross motor grasps with decreased strength. LUE Sensation: WNL LUE Coordination: decreased fine motor;decreased gross motor   Lower Extremity Assessment Lower Extremity Assessment: Defer to PT evaluation   Cervical / Trunk Assessment Cervical / Trunk Assessment: Normal   Communication Communication Communication: Receptive difficulties;Expressive difficulties   Cognition Arousal/Alertness: Awake/alert Behavior During Therapy: WFL for tasks assessed/performed Overall Cognitive Status: Impaired/Different from baseline Area of Impairment: Following commands;Awareness;Problem solving;Safety/judgement;Attention                   Current Attention Level: Selective   Following Commands: Follows one step commands inconsistently;Follows one step commands with increased  time Safety/Judgement: Decreased awareness of safety Awareness: Emergent Problem Solving: Slow processing;Decreased initiation;Difficulty sequencing;Requires verbal cues General Comments: Pt w/ receptive/expressive aphasia. Responding accurately to yes/no questions and attempting to respond with detailed answers. pt follows 1 step commands 75% of the time with increased time, At times demonstrates difficulties with understanding.   General Comments  VSS on RA. BP 160/96    Exercises     Shoulder Instructions      Home Living Family/patient expects to be discharged to:: Private residence Living Arrangements: Spouse/significant other Available Help at Discharge: Family;Available PRN/intermittently Type of Home: House Home Access: Ramped entrance     Home Layout: One level     Bathroom Shower/Tub: Occupational psychologist: Standard Bathroom Accessibility: Yes How Accessible: Accessible via walker Home Equipment: [place] - single point;Wheelchair - manual;Shower seat - built in;Grab bars - toilet;Grab bars - tub/shower;Hand held shower head   Additional Comments: Pt takes care of wife with dementia      Prior Functioning/Environment Level of Independence: Independent with assistive device(s)        Comments: pt ambulates in the home without device, utilizies cane in community on uneven surfaces, driving, likes to do [employer]        OT Problem List: Decreased strength;Decreased activity tolerance;Impaired balance (sitting and/or standing);Decreased coordination;Decreased cognition;Decreased safety awareness;Decreased knowledge of use of DME or AE;Impaired UE functional use      OT Treatment/Interventions:      OT Goals(Current goals can be found in the care plan section) Acute Rehab OT Goals Patient Stated Goal: To feel better OT Goal Formulation: With patient/family Time For Goal Achievement: 11/16/20 Potential to Achieve Goals: Good  ADL Goals Pt Will Perform  Grooming: with modified independence;standing Pt Will Perform Upper Body Dressing: with modified independence;sitting Pt Will Perform Lower Body Dressing: with min assist;sitting/lateral leans;sit to/from stand;with adaptive equipment Pt Will Transfer to Toilet: with modified independence;bedside commode Pt Will Perform Toileting - Clothing Manipulation and hygiene: with supervision;sitting/lateral leans;sit to/from stand  OT Frequency: Min 2X/week   Barriers to D/C:            Co-evaluation              AM-PAC OT "6 Clicks" Daily Activity     Outcome Measure Help from another person eating meals?: A Little Help from another person taking care of personal grooming?: A Little Help from another person toileting, which includes using toliet, bedpan, or urinal?: A Lot Help from another person bathing (including washing, rinsing, drying)?: A Lot Help from another person to put on and taking off regular upper body clothing?: A Little Help from another person to put on and taking off regular lower body clothing?: A Lot 6 Click Score: 15   End of Session Equipment Utilized During Treatment: Gait belt;Rolling walker Nurse Communication: Mobility status;Other (comment) (Pt pulled off heart monitor.)  Activity Tolerance: Patient tolerated treatment well Patient left: in bed;with call bell/phone within reach;with family/visitor present;with bed alarm set  OT Visit Diagnosis: Muscle weakness (generalized) (M62.81);Ataxia, unspecified (R27.0);Unsteadiness on feet (R26.81)                Time: 3893-7342 OT Time Calculation (min): 43 min Charges:  OT General Charges $OT Visit: 1 Visit OT Evaluation $OT Eval Moderate Complexity: 1 Mod OT Treatments $Self Care/Home Management : 23-37 mins  [place] Teters H., OTR/L Acute Rehabilitation  [place] Liz Elane Ngoc Detjen 11/02/2020, 4:42 PM

## 2020-11-02 NOTE — Progress Notes (Signed)
Patient going to MRI - unable to get EEG done at this moment will attempt as schedule permits

## 2020-11-03 ENCOUNTER — Inpatient Hospital Stay (HOSPITAL_COMMUNITY): Payer: Medicare Other

## 2020-11-03 DIAGNOSIS — R509 Fever, unspecified: Secondary | ICD-10-CM

## 2020-11-03 DIAGNOSIS — I169 Hypertensive crisis, unspecified: Secondary | ICD-10-CM

## 2020-11-03 DIAGNOSIS — G9340 Encephalopathy, unspecified: Secondary | ICD-10-CM

## 2020-11-03 DIAGNOSIS — I10 Essential (primary) hypertension: Secondary | ICD-10-CM

## 2020-11-03 LAB — GLUCOSE, CAPILLARY
Glucose-Capillary: 143 mg/dL — ABNORMAL HIGH (ref 70–99)
Glucose-Capillary: 221 mg/dL — ABNORMAL HIGH (ref 70–99)
Glucose-Capillary: 133 mg/dL — ABNORMAL HIGH (ref 70–99)
Glucose-Capillary: 194 mg/dL — ABNORMAL HIGH (ref 70–99)

## 2020-11-03 LAB — CBC
HCT: 34.6 % — ABNORMAL LOW (ref 39.0–52.0)
Hemoglobin: 11.8 g/dL — ABNORMAL LOW (ref 13.0–17.0)
MCH: 30.8 pg (ref 26.0–34.0)
MCHC: 34.1 g/dL (ref 30.0–36.0)
MCV: 90.3 fL (ref 80.0–100.0)
Platelets: 161 10*3/uL (ref 150–400)
RBC: 3.83 MIL/uL — ABNORMAL LOW (ref 4.22–5.81)
RDW: 13.5 % (ref 11.5–15.5)
WBC: 9.6 10*3/uL (ref 4.0–10.5)
nRBC: 0 % (ref 0.0–0.2)

## 2020-11-03 LAB — URINE CULTURE

## 2020-11-03 LAB — CSF CELL COUNT WITH DIFFERENTIAL
RBC Count, CSF: 159 /mm3 — ABNORMAL HIGH
RBC Count, CSF: 2 /mm3 — ABNORMAL HIGH
Tube #: 1
Tube #: 4
WBC, CSF: 2 /mm3 (ref 0–5)
WBC, CSF: 2 /mm3 (ref 0–5)

## 2020-11-03 LAB — BASIC METABOLIC PANEL
Anion gap: 13 (ref 5–15)
BUN: 31 mg/dL — ABNORMAL HIGH (ref 8–23)
CO2: 24 mmol/L (ref 22–32)
Calcium: 8.4 mg/dL — ABNORMAL LOW (ref 8.9–10.3)
Chloride: 104 mmol/L (ref 98–111)
Creatinine, Ser: 1.4 mg/dL — ABNORMAL HIGH (ref 0.61–1.24)
GFR, Estimated: 48 mL/min — ABNORMAL LOW (ref >60)
Glucose, Bld: 118 mg/dL — ABNORMAL HIGH (ref 70–99)
Potassium: 3.1 mmol/L — ABNORMAL LOW (ref 3.5–5.1)
Sodium: 141 mmol/L (ref 135–145)

## 2020-11-03 LAB — PROTEIN AND GLUCOSE, CSF
Glucose, CSF: 93 mg/dL — ABNORMAL HIGH (ref 40–70)
Total  Protein, CSF: 88 mg/dL — ABNORMAL HIGH (ref 15–45)

## 2020-11-03 MED ORDER — HYDRALAZINE HCL 25 MG PO TABS
25.0000 mg | ORAL_TABLET | Freq: Once | ORAL | Status: AC
  Administered 2020-11-03: 25 mg via ORAL
  Filled 2020-11-03: qty 1

## 2020-11-03 MED ORDER — HALOPERIDOL LACTATE 5 MG/ML IJ SOLN: 5.0000 mg | Freq: Every day | INTRAMUSCULAR | Status: DC

## 2020-11-03 MED ORDER — HYDRALAZINE HCL 25 MG PO TABS
25.0000 mg | ORAL_TABLET | Freq: Three times a day (TID) | ORAL | Status: DC
  Administered 2020-11-03: 25 mg via ORAL

## 2020-11-03 MED ORDER — LIDOCAINE HCL (PF) 1 % IJ SOLN
5.0000 mL | Freq: Once | INTRAMUSCULAR | Status: DC
  Filled 2020-11-03: qty 30

## 2020-11-03 MED ORDER — VANCOMYCIN HCL 1000 MG/200ML IV SOLN
1000.0000 mg | INTRAVENOUS | Status: DC
  Administered 2020-11-03 – 2020-11-04 (×2): 1000 mg via INTRAVENOUS
  Filled 2020-11-03 (×2): qty 200

## 2020-11-03 MED ORDER — HYDRALAZINE HCL 50 MG PO TABS
50.0000 mg | ORAL_TABLET | Freq: Three times a day (TID) | ORAL | Status: DC
  Administered 2020-11-03 – 2020-11-08 (×14): 50 mg via ORAL
  Filled 2020-11-03 (×14): qty 1

## 2020-11-03 MED ORDER — POTASSIUM CHLORIDE CRYS ER 20 MEQ PO TBCR
40.0000 meq | EXTENDED_RELEASE_TABLET | Freq: Two times a day (BID) | ORAL | Status: AC
  Administered 2020-11-03 (×2): 40 meq via ORAL
  Filled 2020-11-03 (×2): qty 2

## 2020-11-03 MED ORDER — ENSURE ENLIVE PO LIQD
237.0000 mL | Freq: Two times a day (BID) | ORAL | Status: DC
  Administered 2020-11-03 – 2020-11-08 (×10): 237 mL via ORAL

## 2020-11-03 MED ORDER — QUETIAPINE FUMARATE 25 MG PO TABS
25.0000 mg | ORAL_TABLET | Freq: Every day | ORAL | Status: DC
  Administered 2020-11-03 – 2020-11-07 (×5): 25 mg via ORAL
  Filled 2020-11-03 (×5): qty 1

## 2020-11-03 MED ORDER — LORAZEPAM 2 MG/ML IJ SOLN
1.0000 mg | Freq: Once | INTRAMUSCULAR | Status: AC
  Administered 2020-11-03: 1 mg via INTRAVENOUS
  Filled 2020-11-03: qty 1

## 2020-11-03 NOTE — Progress Notes (Addendum)
Subjective:   Son states he had a rough night, very confused and hallucinating. He seems more agitated and argumentative today compared to the previous day.  Neurology told him they plan on doing spinal tap at some point today.   Objective:  Vital signs in last 24 hours: Vitals:   11/02/20 1604 11/02/20 2100 11/03/20 0451 11/03/20 0611  BP: 140/68 (!) 155/67 (!) 159/72 (!) 153/95  Pulse: 75 (!) 58 65   Resp: 18 16 18    Temp:  98.8 F (37.1 C) 98.6 F (37 C)   TempSrc:  Oral Oral   SpO2: 97% 96% 98%   Weight:      Height:      BP increased today, noted to be flexing while cuff is running, not redirectable unfortunately.   Physical Exam Constitutional: no acute distress, difficulty speaking with stuttering and dysphasia, comprehension is largely intact but does have hard time following some commands Head: atraumatic ENT: external ears normal, neck is supple with no pain on range of motion Eyes: EOMI, pupils are asymmetric likely due to prior injury and surgeries Cardiovascular: regular rate and rhythm, normal heart sounds Pulmonary: effort normal, lungs clear to ascultation bilaterally Abdominal: flat, nontender, no rebound tenderness, bowel sounds normal Skin: warm and dry, no foot ulcers, rash over his shins without broken skin Neurological: alert, unable to assess orientation due to difficulty speaking, unable to assess all cranial nerves but face seems symmetric, extremities seem somewhat weak but symmetric throughout, negative Romberg Psychiatric: normal mood and affect   Assessment/Plan: Caleb Dougherty is a 87 y.o. male with hx of HTN, DM2, HLD presenting with acute encephalopathy and R-sided weakness, also found to have fever and blood pressure 212/85 on arrival. CT and CTA without acute changes, unable to obtain MRI.  Symptomatically improving, no longer having focal neurologic deficits.  Is having more delirium.  Active Problems:   Altered mental status    Malnutrition of moderate degree  Acute encephalopathy Fever Had a fever of 102.6 on 4/5, no fevers since then.  No infectious source so far, blood and urine cultures pending. Urinalysis with hemoglobin but no evidence of infection.  Spot EEG negative for seizure x2. Covering with Abx for possible meningitis/encephalitis -Vancomycin, ceftriaxone, and ampicillin day 3 -acyclovir day 3 -Follow-up urine culture, blood culture x2 -Neurology following, appreciate recommendations     -Follow-up MRI     -follow-up repeat EEG     -LP planned later today -Echo with normal EF, no shunt, dilated IVC with less than 50% auditory variation -hold aspirin -per neuro no need for permissive HTN, normotensive goal over the next day  Hospital delirium Overnight had new agitation and hallucinations, some improved this morning but agitated with high activity. -Reduced lines and nighttime disturbance -start seroquel 25mg  at bedtime  Hypertensive urgency vs emergency  Found to have BP of 210/85 on admission, improved overnight with nitroglycerin drip.  Nitro drip was stopped, goal around 130/80 in the next 24 hours.  Unfortunately blood pressure went back up to 150s overnight.  Measuring in 190s this morning, but noted to be flexing his arm and not redirectable so reading may not be accurate. -Stop nitro drip -Hold off home losartan given AKI -Increase hydralazine to 25 mg daily, addition for 5 mg now  AKI Creatinine 1/40 from 1.0 on admission, baseline around 1.2.  Could be due to acyclovir, contrast dye, excessive drop in blood pressure. -Holding losartan as above -Daily BMP, avoid nephrotoxins  Lung nodules Nonspecific  lung nodule seen on chest x-ray. -Follow-up noncontrast chest CT  Insulin dependent type II diabetes A1c of 8.9.  Home Lantus 18 units daily. -Lantus 20 units daily -Continue sliding scale insulin   Diet:  Dysphagia II IVF:  none VTE:  SCD Prior to Admission Living Arrangement:   home Anticipated Discharge Location:  TBD Barriers to Discharge:  Medical workup and HTN management Dispo: Anticipated discharge in approximately 2-3 day(s).   Andrew Au, MD 11/03/2020, 11:33 AM Pager: [phone] After 5pm on weekdays and 1pm on weekends: On Call pager [phone]

## 2020-11-03 NOTE — Progress Notes (Signed)
PT Cancellation Note  Patient Details Name: Caleb Dougherty MRN: [id number] DOB: May 07, 1929   Cancelled Treatment:    Reason Eval/Treat Not Completed: Patient at procedure or test/unavailable, working with SLP. Will follow-up for PT treatment as schedule permits.  Mabeline Caras, PT, DPT Acute Rehabilitation Services  Pager [phone] Office [place] 11/03/2020, 10:52 AM

## 2020-11-03 NOTE — Plan of Care (Signed)
  Problem: Nutrition: Goal: Adequate nutrition will be maintained Outcome: Progressing   Problem: Elimination: Goal: Will not experience complications related to bowel motility Outcome: Progressing   Problem: Safety: Goal: Ability to remain free from injury will improve Outcome: Progressing   Problem: Skin Integrity: Goal: Risk for impaired skin integrity will decrease Outcome: Progressing   

## 2020-11-03 NOTE — Evaluation (Signed)
Speech Language Pathology Evaluation Patient Details Name: Caleb Dougherty MRN: [id number] DOB: 01-05-1929 Today's Date: 11/03/2020 Time: 8341-9622 SLP Time Calculation (min) (ACUTE ONLY): 35 min  Problem List:  Patient Active Problem List   Diagnosis Date Noted  . Malnutrition of moderate degree 11/02/2020  . Altered mental status 11/01/2020  . 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 DJD (degenerative joint disease) of knee 05/25/2014  . Overweight (BMI 25.0-29.9) 02/24/2014  . S/P left TKA 02/23/2014   Past Medical History:  Past Medical History:  Diagnosis Date  . Arthritis    osteoarthritis-knees and feet  . BPH (benign prostatic hypertrophy)   . Cancer [facility])    melanoma(back)-no futher issues. Skin cancers face "frozen"  . Cataract    02-12-14 left- ready for surgery, but only has had right done  . Diabetes mellitus without complication ([place])    diabetes x4 yrs-oral med and Lantus used  . HOH (hard of hearing)    wears bilateral hearing aids  . Hypertension   . Impaired hearing    hard of hearing -no hearing aids  . Urgency of urination   . Wears dentures    full lower  . Wears glasses    reading   Past Surgical History:  Past Surgical History:  Procedure Laterality Date  . CARPAL TUNNEL RELEASE Right 06/23/2013   Procedure: RIGHT CARPAL TUNNEL RELEASE;  Surgeon: Cammie Sickle., MD;  Location: [place];  Service: Orthopedics;  Laterality: Right;  . CATARACT EXTRACTION Right   . CATARACT EXTRACTION W/PHACO Left 03/30/2019   Procedure: CATARACT EXTRACTION PHACO AND INTRAOCULAR LENS PLACEMENT (IOC) LEFT DIABETES  02:21.8  19.2%  28.18;  Surgeon: Eulogio Bear, MD;  Location: [place];  Service: Ophthalmology;  Laterality: Left;  diabetic - insulin  . COLONOSCOPY    . INCISION AND DRAINAGE WOUND WITH FOREIGN BODY REMOVAL Left 12/07/2014   Procedure: INCISION AND DRAINAGE WOUND WITH FOREIGN BODY REMOVAL;   Surgeon: Corky Mull, MD;  Location: [facility];  Service: Orthopedics;  Laterality: Left;  . REVERSE SHOULDER ARTHROPLASTY Left 11/19/2014   Procedure: LEFT REVERSE SHOULDER ARTHROPLASTY;  Surgeon: Netta Cedars, MD;  Location: [place];  Service: Orthopedics;  Laterality: Left;  . TOTAL KNEE ARTHROPLASTY Left 02/23/2014   Procedure: LEFT TOTAL KNEE REPLACEMENT;  Surgeon: Mauri Pole, MD;  Location: [facility];  Service: Orthopedics;  Laterality: Left;  . TOTAL KNEE ARTHROPLASTY Right 05/25/2014   Procedure: RIGHT TOTAL KNEE ARTHROPLASTY;  Surgeon: Mauri Pole, MD;  Location: [facility];  Service: Orthopedics;  Laterality: Right;   HPI:  Pt is an 87 y.o. male presenting to [facility] ED on 11/01/2020 as a code stroke from home. Pt with R weakness, L gaze. CT imaging is unremarkable. CXR 4/6: Scattered bilateral pulmonary nodules are nonspecific. PMH includes HOH, HTN, and type II DM.  MRI could not be completed, further workup is pending with LP planned 4/7   Assessment / Plan / Recommendation Clinical Impression  Pt presents with receptive and expressive language deficits.  Pt was assessed using Western Aphasia Battery - Bedside Form (see below for additional information).  Pt's expressive language was marked by semantic paraphasias in confrontational naming task ("cabinet" for "bed", "arm" for "elbow", "pen" for "pencil").  Pt did not benefit from semantic or phonemic cuing and did not seem aware of errors.  Spontaneous speech was more fluent than directed speech (picture description), but both were at times devoid  of information content.  Repetition was relatively spared, with some omissions, and one instance of vowel distortion/substitution ("bud" for "bed").  Receptive language deficits include difficulty with sequential command following.  Accuracy improved with single step commands not requiring the use of manipulables.  Pt was able to answer personal yes/no questions accurately, but accuracy decreased with  complexity of question.  Pt required several repetitions of instructions for many of the tasks attempted today.  Pt's speech was largely intelligible, especially in known context. Intelligbility was decreased at times with speech marked by low volume mumbling which appeared to be related pt trying to put together a sentence, rather than a true dysarthria.  Despite a lack of awareness around paraphasias, pt does appear frustrated with his change in functional status "I know, but I can't answer" and became tearful near the end of the evaluation.    Pt would benefit from ST to address the above noted deficits, and will likely benefit from continued intervention at next level of care as well including futher assessment of cognitive ability, especially given recent findings of mild dementia, should language competency improve to allow further evaluation.  Western Aphasia Battery - Bedside Form Content: 3/10 Fluency: 4/10  Yes/No: 4/10 Sequential Commands: 0/10 Repetition: 7.5/10 Naming: 6/10 (10 items attempted instead of 20 2/2 pt frustration/fatigue) Bedside aphasia score: 41/100     SLP Assessment  SLP Recommendation/Assessment: Patient needs continued Speech Lanaguage Pathology Services SLP Visit Diagnosis: Dysphagia, unspecified (R13.10)    Follow Up Recommendations  Inpatient Rehab    Frequency and Duration min 2x/week  2 weeks      SLP Evaluation Cognition  Overall Cognitive Status: Impaired/Different from baseline       Comprehension  Auditory Comprehension Overall Auditory Comprehension: Impaired Yes/No Questions: Impaired Complex Questions: 0-24% accurate Commands: Impaired One Step Basic Commands: 75-100% accurate Two Step Basic Commands: 0-24% accurate Conversation: Simple EffectiveTechniques: Repetition Reading Comprehension Reading Status: Not tested    Expression Expression Primary Mode of Expression: Verbal Verbal Expression Overall Verbal Expression:  Impaired Level of Generative/Spontaneous Verbalization: Phrase Repetition: Impaired Level of Impairment: Sentence level Naming: Impairment Confrontation: Impaired Verbal Errors: Semantic paraphasias Written Expression Dominant Hand: Right Written Expression: Not tested   Oral / Motor  Oral Motor/Sensory Function Overall Oral Motor/Sensory Function: Other (comment) (No obvious assymmetry or weakness) Motor Speech Respiration: Within functional limits Phonation: Normal Articulation: Within functional limitis Intelligibility: Intelligible   GO                    Celedonio Savage, MA, [place] Office: [phone] 11/03/2020, 11:45 AM

## 2020-11-03 NOTE — Progress Notes (Signed)
  Speech Language Pathology Treatment: Dysphagia  Patient Details Name: Caleb Dougherty MRN: [id number] DOB: 1928/08/12 Today's Date: 11/03/2020 Time: 9622-2979 SLP Time Calculation (min) (ACUTE ONLY): 10 min  Assessment / Plan / Recommendation Clinical Impression  Pt tolerated all consistencies trialed with no clinical s/s of aspiration.  Pt exhibited excellent oral clearance of regular solid textures.  Pt has bottom dentures, which he usually wears with PO intake, but it is not in facility at this time.  Family may want to bring in dentures for ease of mastication, but pt did not have any difficulty with trials today.  Pt was able to feed himself cracker but required cuing to initiate.  With pudding, pt benefited from tactile cuing for spoon use, and dropped some on himself.  He may need some feeding assistance.  Recommend advancing diet to regular texture solids with thin liquids.     HPI HPI: Pt is an 87 y.o. male presenting to [facility] ED on 11/01/2020 as a code stroke from home. Pt with R weakness, L gaze. CT imaging is unremarkable. CXR 4/6: Scattered bilateral pulmonary nodules are nonspecific. PMH includes HOH, HTN, and type II DM.      SLP Plan  Continue with current plan of care       Recommendations  Diet recommendations: Regular;Thin liquid Liquids provided via: Cup;Straw Medication Administration: Crushed with puree Supervision: Staff to assist with self feeding Compensations: Minimize environmental distractions;Slow rate;Small sips/bites Postural Changes and/or Swallow Maneuvers: Seated upright 90 degrees                Oral Care Recommendations: Oral care BID Follow up Recommendations: Inpatient Rehab SLP Visit Diagnosis: Dysphagia, unspecified (R13.10) Plan: Continue with current plan of care       [place], [place], [place] Office: [phone] 11/03/2020, 11:23 AM

## 2020-11-03 NOTE — Procedures (Signed)
LUMBAR PUNCTURE (SPINAL TAP) PROCEDURE NOTE  Indication: AMS   Proceduralists: Dr Rory Percy Assistants: Dr. Orlan Leavens NP Bedside RN and SWAT/RRT RN also at bedside Son at bedside   Risks of the procedure were dicussed with the patient including post-LP headache, bleeding, infection, weakness/numbness of legs(radiculopathy), death.    Consent obtained from: Mas[place], [place]   Procedure Note The patient was prepped and draped, and using sterile technique a 20 gauge quinke spinal needle was inserted in the L4-5 space.   Opening pressure was not measured due to him being altered and difficult to position and keep still.  Approximately 16  cc of CSF were obtained and sent for analysis.  Patient tolerated the procedure well and blood loss was minimal.   -- Amie [place], MD Neurologist [facility] Pager: [phone]

## 2020-11-03 NOTE — Progress Notes (Signed)
Pharmacy Antibiotic Note  Caleb Dougherty is a 87 y.o. male admitted on 11/01/2020 with acute worsening confusion, now with concern for possible meningitis.  Pharmacy has been consulted for vancomycin/ampicillin/acyclovir dosing. Plans noted for LP today -WBC= 9.6, afeb -SCr1.4 (trend up) -cultures- ngtd  Plan:  Ceftriaxone 2g IV q12h Ampicillin 2g IV q6h Change vancomycin to 1000mg  IV q24h (goal trough 15-20)  Acyclovir 10 mg/kg IV q12h   Height: 5\' 8"  (172.7 cm) Weight: 72.6 kg (160 lb) IBW/kg (Calculated) : 68.4  Temp (24hrs), Avg:98.6 F (37 C), Min:98 F (36.7 C), Max:99.1 F (37.3 C)  Recent Labs  Lab 11/01/20 0918 11/01/20 0924 11/02/20 0107 11/03/20 0400  WBC 6.4  --  9.5 9.6  CREATININE 1.23 1.10 1.39* 1.40*    Estimated Creatinine Clearance: 34.6 mL/min (A) (by C-G formula based on SCr of 1.4 mg/dL (H)).    Allergies  Allergen Reactions  . Gabapentin   . Indomethacin     Other reaction(s): Edema of lower extremity  . Lisinopril Cough  . Nortriptyline     Other reaction(s): Edema of lower extremity  . Empagliflozin     Other reaction(s): Urinary incontinence    Antimicrobials this admission: Ampicillin 4/5 >> Ceftriaxone 4/5 >>  Vancomycin 4/5 >>  Acyclovir 4/5 >>    Thank you for allowing pharmacy to be a part of this patient's care.  Hildred Laser, PharmD Clinical Pharmacist **Pharmacist phone directory can now be found on [email or web address] ([email or web address]).  Listed under [place].

## 2020-11-03 NOTE — Progress Notes (Signed)
Inpatient Rehabilitation Admissions Coordinator  I await definitve diagnosis before placing rehab consult. Noted continued medical workup in process. I will not place a rehab consult at this time, I will follow.  Danne Baxter, RN, MSN Rehab Admissions Coordinator [phone] 11/03/2020 2:32 PM

## 2020-11-03 NOTE — Progress Notes (Signed)
Physical Therapy Treatment Patient Details Name: Caleb Dougherty MRN: [id number] DOB: Jun 10, 1929 Today's Date: 11/03/2020    History of Present Illness Pt is an 87 y.o. male admitted 11/01/20 as code stroke with L-side weakness, facial droop, AMS; LKW time at 2100 on 4/4. Head CT unremarkable. Awaiting MRI. CXR with scattered bilateral pulmonary nodules, nonspecific. EEG 4/6 suggestive of moderate encephalopathy; no seizure activity. S/p lumbar puncture 4/7. PMH includes HOH, HTN, DM2.   PT Comments    Pt with increased fatigue/lethargy this session; son reports pt did not sleep well last night (worsening AMS), and pt also had lumbar puncture earlier today. Today's session focused on sitting balance and standing trials, pt requiring mod-maxA this session; pt with difficulty taking steps with multiple LOB while sitting and standing requiring assist to correct. Pt with some verbalizations, although difficult to understand at times due to soft, mumbling speech. Pt inconsistent with command following. Pt's son and wife present, supportive. Continue to recommend intensive CIR-level therapies to maximize functional mobility and independence.    Follow Up Recommendations  CIR     Equipment Recommendations  Rolling walker with 5" wheels;3in1 (PT);Wheelchair (measurements PT);Wheelchair cushion (measurements PT)    Recommendations for Other Services       Precautions / Restrictions Precautions Precautions: Fall Restrictions Weight Bearing Restrictions: No    Mobility  Bed Mobility Overal bed mobility: Needs Assistance Bed Mobility: Supine to Sit;Sit to Supine     Supine to sit: Max assist;HOB elevated Sit to supine: Max assist   General bed mobility comments: Pt with decreased initiation, requiring maxA to manage BLEs and modA for trunk elevation, pt initially resisting movement then more participatory; maxA for return to supine and scooting up in bed    Transfers Overall transfer level:  Needs assistance Equipment used: 1 person hand held assist;None Transfers: Sit to/from Stand Sit to Stand: Mod assist;Max assist         General transfer comment: Performed 4x sit<>stands from EOB fluctuating between mod-maxA with and without HHA; pt at times reaching for support of RUE; noted L knee instability and pt bracing BLEs against EOB  Ambulation/Gait             General Gait Details: Attempted side steps at EOB, pt able to move R foot but unable to take step with LLE; multiple posterior LOB requiring assist to maintain upright   Stairs             Wheelchair Mobility    Modified Rankin (Stroke Patients Only) Modified Rankin (Stroke Patients Only) Pre-Morbid Rankin Score: No symptoms Modified Rankin: Severe disability     Balance Overall balance assessment: Needs assistance Sitting-balance support: Feet supported Sitting balance-Leahy Scale: Poor Sitting balance - Comments: Pt with persistent L lateral lean, hevay reliance on RUE support from therapist to achieve and maintain midline posture; brief bouts of static sitting with min guard, noted improvement when pt told to "stand up" then able to weight shift anteriorly and keep midline posture for a few seconds Postural control: Left lateral lean;Posterior lean Standing balance support: Single extremity supported;During functional activity Standing balance-Leahy Scale: Zero Standing balance comment: Reliant on UE support and maxA to maintain static standing; posterior and L-lean                            Cognition Arousal/Alertness: Lethargic Behavior During Therapy: Flat affect Overall Cognitive Status: Impaired/Different from baseline Area of Impairment: Following commands;Awareness;Problem solving;Safety/judgement;Attention  Current Attention Level: Sustained   Following Commands: Follows one step commands inconsistently;Follows one step commands with increased  time Safety/Judgement: Decreased awareness of safety;Decreased awareness of deficits Awareness: Intellectual Problem Solving: Slow processing;Decreased initiation;Difficulty sequencing;Requires verbal cues General Comments: Pt with increased lethargy today; son present and reports pt did not sleep all night due to confusion, pt also had lumbar puncture earlier and received meds to help calm him for that. Pt with verbalizations although difficult to understand at time due to mumbling, soft speech. Pt with inconsistent command following      Exercises      General Comments General comments (skin integrity, edema, etc.): Pt's son and wife Caleb Dougherty) present; BP 181/87      Pertinent Vitals/Pain Pain Assessment: Faces Faces Pain Scale: No hurt Pain Intervention(s): Monitored during session    Home Living                      Prior Function            PT Goals (current goals can now be found in the care plan section) Progress towards PT goals: Not progressing toward goals - comment (increased lethargy and weakness this session)    Frequency    Min 4X/week      PT Plan Current plan remains appropriate    Co-evaluation              AM-PAC PT "6 Clicks" Mobility   Outcome Measure  Help needed turning from your back to your side while in a flat bed without using bedrails?: A Lot Help needed moving from lying on your back to sitting on the side of a flat bed without using bedrails?: A Lot Help needed moving to and from a bed to a chair (including a wheelchair)?: A Lot Help needed standing up from a chair using your arms (e.g., wheelchair or bedside chair)?: A Lot Help needed to walk in hospital room?: Total Help needed climbing 3-5 steps with a railing? : Total 6 Click Score: 10    End of Session Equipment Utilized During Treatment: Gait belt Activity Tolerance: Patient tolerated treatment well;Patient limited by lethargy Patient left: in bed;with call  bell/phone within reach;with bed alarm set Nurse Communication: Mobility status PT Visit Diagnosis: Unsteadiness on feet (R26.81);Other abnormalities of gait and mobility (R26.89);Muscle weakness (generalized) (M62.81);Other symptoms and signs involving the nervous system (R29.898)     Time: 4854-6270 PT Time Calculation (min) (ACUTE ONLY): 32 min  Charges:  $Therapeutic Activity: 23-37 mins                     Mabeline Caras, PT, DPT Acute Rehabilitation Services  Pager [phone] Office [place] 11/03/2020, 5:37 PM

## 2020-11-03 NOTE — Progress Notes (Signed)
Nutrition Follow-up  DOCUMENTATION CODES:   Non-severe (moderate) malnutrition in context of chronic illness  INTERVENTION:  Provide Ensure Enlive po BID, each supplement provides 350 kcal and 20 grams of protein  Encourage PO intake.   NUTRITION DIAGNOSIS:   Moderate Malnutrition related to chronic illness (arthritis, cancer) as evidenced by mild fat depletion,mild muscle depletion; ongoing  GOAL:   Patient will meet greater than or equal to 90% of their needs; progressing  MONITOR:   Diet advancement,Weight trends  REASON FOR ASSESSMENT:   Malnutrition Screening Tool    ASSESSMENT:   87 year-old male with medical history of HTN, arthritis, cancer, and type 2 DM. he presented to the ED due to AMS, weakness, and aphasia.  Plan for LP today for possible meningitis/encephalitis. Pt with confusion and delirium. Diet has been advanced to a regular diet with thin liquids. RD to order nutritional supplements to aid in caloric and protein needs.    Labs and medications reviewed.   Diet Order:   Diet Order            Diet regular Room service appropriate? Yes; Fluid consistency: Thin  Diet effective now                 EDUCATION NEEDS:   Not appropriate for education at this time  Skin:  Skin Assessment: Reviewed RN Assessment  Last BM:  4/5  Height:   Ht Readings from Last 1 Encounters:  11/01/20 5\' 8"  (1.727 m)    Weight:   Wt Readings from Last 1 Encounters:  11/01/20 72.6 kg   BMI:  Body mass index is 24.33 kg/m.  Estimated Nutritional Needs:   Kcal:  1700-1900 kcal  Protein:  80-90 grams  Fluid:  >/= 1.8 L/day  Corrin Parker, MS, RD, LDN RD pager number/after hours weekend pager number on Amion.

## 2020-11-03 NOTE — Progress Notes (Addendum)
NEUROLOGY CONSULTATION PROGRESS NOTE   Date of service: November 03, 2020 Patient Name: Caleb Dougherty MRN:  [id number] DOB:  1928-08-23  Brief HPI  Caleb Dougherty is a 87 y.o. male with PMH significant for  has a past medical history of Arthritis, BPH (benign prostatic hypertrophy), Cancer ([place]), Cataract, Diabetes mellitus without complication ([place]), HOH (hard of hearing), Hypertension, Impaired hearing, Urgency of urination, Wears dentures, and Wears glasses. who presents with Left sided weakness and facial droop. LKW at 2100 on 4/4. At that time he went to bed. He woke up and went to the bathroom but afterwards was unable to get back to bed due to weakness. The wife reports he got up around 7 AM to use the bathroom, however, per family she has dementia and so cannot rely on her time frame.   Interval Hx   He has acutely worsened since yesterday. Yesterday he became confused and refused the MRI though he had earlier agreed to it. The son reports that in the evening he began to see things - ceiling tile spinning, nurse walking in the room was falling, a scratch on the sons head was bleeding, and that the son was dead. The son reports he also began having issues speaking slurring his words. Vitals   Vitals:   11/02/20 1604 11/02/20 2100 11/03/20 0451 11/03/20 0611  BP: 140/68 (!) 155/67 (!) 159/72 (!) 153/95  Pulse: 75 (!) 58 65   Resp: 18 16 18    Temp:  98.8 F (37.1 C) 98.6 F (37 C)   TempSrc:  Oral Oral   SpO2: 97% 96% 98%   Weight:      Height:         Body mass index is 24.33 kg/m.  Physical Exam   General: Laying comfortably in bed; in no acute distress.  HENT: Normal oropharynx and mucosa. Normal external appearance of ears and nose.  Neck: Supple, no pain or tenderness  CV: No JVD. No peripheral edema.  Pulmonary: Symmetric Chest rise. Normal respiratory effort.  Abdomen: Soft to touch, non-tender.  Ext: No cyanosis, edema, or deformity  Skin: No rash. Normal palpation  of skin.   Musculoskeletal: Normal digits and nails by inspection. No clubbing.   Neurologic Examination  Mental status/Cognition: Alert, oriented to self only, inattentive during interview requiring multiple prompts to have questions answered.  Speech/language: slurred, incomplete sentences. Cranial nerves:   CN II Pupils equal and reactive to light, no VF deficits    CN III,IV,VI EOM intact, no gaze preference or deviation, no nystagmus    CN V normal sensation in V1, V2, and V3 segments bilaterally    CN VII no asymmetry, no nasolabial fold flattening    CN VIII normal hearing to speech    CN IX & X normal palatal elevation, no uvular deviation    CN XI 5/5 head turn and 5/5 shoulder shrug bilaterally    CN XII midline tongue protrusion    Motor:  Muscle bulk: normal, tone normal, has slight drift in his arms Able to lift and hold all limbs against gravity. Upper and Lower Extremities are at least 4+/5. No tremor noticed today.  Sensation: Intact to light touch  Coordination/Complex Motor:  Difficult to assess due to AMS but it does seem like he has some tremor off-and-on in both arms  Labs   Basic Metabolic Panel:  Lab Results  Component Value Date   NA 141 11/03/2020   K 3.1 (L) 11/03/2020   CO2  24 11/03/2020   GLUCOSE 118 (H) 11/03/2020   BUN 31 (H) 11/03/2020   CREATININE 1.40 (H) 11/03/2020   CALCIUM 8.4 (L) 11/03/2020   GFRNONAA 48 (L) 11/03/2020   GFRAA 55 (L) 11/20/2014   HbA1c:  Lab Results  Component Value Date   HGBA1C 8.9 (H) 11/02/2020   LDL:  Lab Results  Component Value Date   LDLCALC 68 11/02/2020   Urine Drug Screen: No results found for: LABOPIA, COCAINSCRNUR, LABBENZ, AMPHETMU, THCU, LABBARB  Alcohol Level No results found for: ETH No results found for: PHENYTOIN, ZONISAMIDE, LAMOTRIGINE, LEVETIRACETA No results found for: PHENYTOIN, PHENOBARB, VALPROATE, CBMZ  Imaging and Diagnostic studies  Results for orders placed during the hospital  encounter of 11/01/20  ECHOCARDIOGRAM COMPLETE 1. Left ventricular ejection fraction, by estimation, is 60 to 65%. The left ventricle has normal function. The left ventricle has no regional wall motion abnormalities. Left ventricular diastolic parameters are consistent with Grade I diastolic dysfunction (impaired relaxation). 2. Right ventricular systolic function is normal. The right ventricular size is normal. There is mildly elevated pulmonary artery systolic pressure. 3. Left atrial size was mildly dilated. 4. The mitral valve is normal in structure. No evidence of mitral valve regurgitation. No evidence of mitral stenosis. 5. The aortic valve is normal in structure. Aortic valve regurgitation is not visualized. No aortic stenosis is present. 6. The inferior vena cava is dilated in size with <50% respiratory variability, suggesting right atrial pressure of 15 mmHg.    Impression   Caleb Dougherty is a 87 y.o. male with PMH significant for HTN, diabetes and some cognitive decline at baseline presenting for evaluation of what appears to be rapidly progressive dementia with hallucinations and behavioral disturbances. His neurologic examination is notable for cognitive decline over the past 2 weeks with more rapid progression in the past few days. He is having more trouble speaking and is confused. He is hallucinating stating his son is dead while talking to his son. Unable to get MRI yesterday afternoon/evening due to confusion. Blood cultures no growth at 1 day Urine Culture - multiple species present suggest recollection  Differentials at this time include:  Hypertensive Emergency - high on the differential  CNS infection: higher on the differential given his fever 2 nights ago and acute change in mental status  Lutricia Horsfall or Acute Delirium - Confusion started in the evening and is having hallucinations  Stroke - on the differential given the sudden onset aphasia and Right sided  weakness despite negative CT, CTA, and CT Perfusion  Seizures - low on the differential  Repeat EEG not concerning for ongoing seizure  Given rapidly progressing dementia-Creutzfeldt Edison Nasuti disease is also on the differentials  Recommendations  -LP today including sending for protein 14-3-3 to evaluate for Wynelle Cleveland disease -Blood pressure medical management per primary team as you are  ______________________________________________________________________   Fatima Sanger MD Resident  Attending Neurohospitalist Addendum Patient seen and examined with APP/Resident. Agree with the history and physical as documented above. Agree with the plan as documented, which I helped formulate. I have independently reviewed the chart, obtained history, review of systems and examined the patient.I have personally reviewed pertinent head/neck/spine imaging (CT/MRI). Please feel free to call with any questions. --- Amie Portland, MD [facility] Pager: [phone]  If 7pm to 7am, please call on call as listed on AMION.

## 2020-11-04 LAB — BASIC METABOLIC PANEL
Anion gap: 9 (ref 5–15)
BUN: 26 mg/dL — ABNORMAL HIGH (ref 8–23)
CO2: 24 mmol/L (ref 22–32)
Calcium: 8.1 mg/dL — ABNORMAL LOW (ref 8.9–10.3)
Chloride: 107 mmol/L (ref 98–111)
Creatinine, Ser: 1.43 mg/dL — ABNORMAL HIGH (ref 0.61–1.24)
GFR, Estimated: 47 mL/min — ABNORMAL LOW (ref >60)
Glucose, Bld: 187 mg/dL — ABNORMAL HIGH (ref 70–99)
Potassium: 3.8 mmol/L (ref 3.5–5.1)
Sodium: 140 mmol/L (ref 135–145)

## 2020-11-04 LAB — URINALYSIS, ROUTINE W REFLEX MICROSCOPIC
Bilirubin Urine: NEGATIVE
Glucose, UA: >=500 mg/dL — AB
Hgb urine dipstick: NEGATIVE
Ketones, ur: NEGATIVE mg/dL
Leukocytes,Ua: NEGATIVE
Nitrite: NEGATIVE
Protein, ur: 100 mg/dL — AB
Specific Gravity, Urine: 1.026 (ref 1.005–1.030)
pH: 5 (ref 5.0–8.0)

## 2020-11-04 LAB — GLUCOSE, CAPILLARY
Glucose-Capillary: 211 mg/dL — ABNORMAL HIGH (ref 70–99)
Glucose-Capillary: 340 mg/dL — ABNORMAL HIGH (ref 70–99)
Glucose-Capillary: 479 mg/dL — ABNORMAL HIGH (ref 70–99)
Glucose-Capillary: 454 mg/dL — ABNORMAL HIGH (ref 70–99)
Glucose-Capillary: 406 mg/dL — ABNORMAL HIGH (ref 70–99)
Glucose-Capillary: 226 mg/dL — ABNORMAL HIGH (ref 70–99)

## 2020-11-04 LAB — CBC
HCT: 34.6 % — ABNORMAL LOW (ref 39.0–52.0)
Hemoglobin: 11.6 g/dL — ABNORMAL LOW (ref 13.0–17.0)
MCH: 30.4 pg (ref 26.0–34.0)
MCHC: 33.5 g/dL (ref 30.0–36.0)
MCV: 90.6 fL (ref 80.0–100.0)
Platelets: 147 10*3/uL — ABNORMAL LOW (ref 150–400)
RBC: 3.82 MIL/uL — ABNORMAL LOW (ref 4.22–5.81)
RDW: 13.5 % (ref 11.5–15.5)
WBC: 7.2 10*3/uL (ref 4.0–10.5)
nRBC: 0 % (ref 0.0–0.2)

## 2020-11-04 LAB — SODIUM, URINE, RANDOM: Sodium, Ur: 21 mmol/L

## 2020-11-04 LAB — HSV 1/2 PCR, CSF
HSV-1 DNA: NEGATIVE
HSV-2 DNA: NEGATIVE

## 2020-11-04 LAB — CREATININE, URINE, RANDOM: Creatinine, Urine: 107.86 mg/dL

## 2020-11-04 MED ORDER — LACTATED RINGERS IV BOLUS
500.0000 mL | Freq: Once | INTRAVENOUS | Status: AC
  Administered 2020-11-04: 500 mL via INTRAVENOUS

## 2020-11-04 MED ORDER — INSULIN ASPART 100 UNIT/ML ~~LOC~~ SOLN
0.0000 [IU] | Freq: Three times a day (TID) | SUBCUTANEOUS | Status: DC
  Administered 2020-11-05: 8 [IU] via SUBCUTANEOUS
  Administered 2020-11-05: 11 [IU] via SUBCUTANEOUS
  Administered 2020-11-05: 5 [IU] via SUBCUTANEOUS
  Administered 2020-11-06: 8 [IU] via SUBCUTANEOUS
  Administered 2020-11-06: 15 [IU] via SUBCUTANEOUS
  Administered 2020-11-06: 8 [IU] via SUBCUTANEOUS
  Administered 2020-11-07: 15 [IU] via SUBCUTANEOUS
  Administered 2020-11-07: 3 [IU] via SUBCUTANEOUS
  Administered 2020-11-07: 11 [IU] via SUBCUTANEOUS
  Administered 2020-11-08: 3 [IU] via SUBCUTANEOUS
  Administered 2020-11-08 (×2): 15 [IU] via SUBCUTANEOUS

## 2020-11-04 MED ORDER — INSULIN ASPART 100 UNIT/ML ~~LOC~~ SOLN
15.0000 [IU] | Freq: Once | SUBCUTANEOUS | Status: AC
  Administered 2020-11-04: 15 [IU] via SUBCUTANEOUS

## 2020-11-04 NOTE — Progress Notes (Addendum)
Subjective:   Much more alert this morning and communicating more clearly. Mr. Scott denies any acute complaints at this time. He is awake and alert, visiting with family. Family at bedside state he continues to try and stand up; he did have a good appetite with breakfast.   We discussed treatment plans, including stopping antibiotics but continuing antiviral.  Objective:  Vital signs in last 24 hours: Vitals:   11/03/20 1158 11/03/20 1355 11/03/20 2011 11/04/20 0530  BP: (!) 191/86 (!) 173/79 (!) 172/99 (!) 189/82  Pulse: 66  70 73  Resp: 16  18 16   Temp: 99.1 F (37.3 C)  97.9 F (36.6 C) 98.3 F (36.8 C)  TempSrc: Oral  Oral Oral  SpO2: 96%  98% 97%  Weight:    70.9 kg  Height:      BP increased today, noted to be flexing while cuff is running, not redirectable unfortunately.   Physical Exam Constitutional: no acute distress, difficulty speaking with stuttering and dysphasia, comprehension is largely intact but does have hard time following some commands Head: atraumatic ENT: external ears normal, neck is supple with no pain on range of motion Eyes: EOMI, pupils are asymmetric likely due to prior injury and surgeries Cardiovascular: regular rate and rhythm, normal heart sounds Pulmonary: effort normal, lungs clear to ascultation bilaterally Abdominal: flat, nontender, no rebound tenderness, bowel sounds normal Skin: warm and dry, no foot ulcers, rash over his shins without broken skin Neurological: alert, unable to assess orientation due to difficulty speaking, unable to assess all cranial nerves but face seems symmetric, extremities seem somewhat weak but symmetric throughout, negative Romberg Psychiatric: normal mood and affect   Assessment/Plan: ISSAAC SHIPPER is a 87 y.o. male with hx of HTN, DM2, HLD presenting with acute encephalopathy and R-sided weakness, also found to have fever and blood pressure 212/85 on arrival. CT and CTA without acute changes, unable to  obtain MRI.  Symptomatically improving, no longer having focal neurologic deficits.  Is having more delirium.  Active Problems:   Altered mental status   Malnutrition of moderate degree   Acute encephalopathy   Fever   Hypertensive crisis  Acute encephalopathy Fever Had a fever of 102.6 on 4/5, no fevers since then. CSF with elevated proteins so suspecting viral meningitis/encephalitis. CJD is also possible with a sibling who had sudden neuro decline and death within 2 months. No other infectious source so far, blood and urine cultures pending. Urinalysis with hemoglobin but no evidence of infection. Urine culture with mixed flora. Spot EEG negative for seizure x2. Echo with no shunt.  -Neurology following, appreciate recommendations -acyclovir day 4 -stop Vancomycin, ceftriaxone, and ampicillin, received 4 days -Follow-up CSF beta-isoform (CJD study) and viral encephalitis panel -Follow-up blood culture x2, CSF culture. NGTD -hold aspirin  Hypertensive emergency vs urgency Found to have BP of 210/85 on admission, improved nitroglycerin drip and transitioned to PO hydralazine, dose titrated up to 50mg  TID. SBP 156/99 most recently. noted to be flexing his arm and hard to redirect so reading may not be accurate. -Stop nitro drip -Hold off home losartan given AKI -hydralazine 50mg  TID  Hospital delirium Much improved.  -Reduced lines and nighttime disturbance -seroquel 25mg  at bedtime  AKI Creatinine 1.40 from 1.0 on admission, baseline around 1.2.  Could be due to acyclovir, contrast dye, excessive drop in blood pressure. -Holding losartan as above -Daily BMP, avoid nephrotoxins -500cc bolus today  Lung nodules Nonspecific lung nodule seen on chest x-ray. -Follow-up noncontrast chest  CT  Insulin dependent type II diabetes A1c of 8.9.  Home Lantus 18 units daily. -Lantus 20 units daily -Continue sliding scale insulin   Diet:  Dysphagia II IVF:  none VTE:  SCD Prior to  Admission Living Arrangement:  home Anticipated Discharge Location:  TBD Barriers to Discharge:  Medical workup and HTN management Dispo: Anticipated discharge in approximately 2-3 day(s).   Andrew Au, MD 11/04/2020, 7:19 AM Pager: [phone] After 5pm on weekdays and 1pm on weekends: On Call pager [phone]
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
## 2020-11-04 NOTE — Progress Notes (Signed)
HSV PCR negative Can discontinue acyclovir  -- Amie Portland, MD Neurologist [facility] Pager: [phone]

## 2020-11-04 NOTE — Progress Notes (Signed)
Occupational Therapy Treatment Patient Details Name: Caleb Dougherty MRN: [id number] DOB: 1928-09-29 Today's Date: 11/04/2020    History of present illness Pt is an 87 y.o. male admitted 11/01/20 as code stroke with L-side weakness, facial droop, AMS; LKW time at 2100 on 4/4. Head CT unremarkable. Awaiting MRI. CXR with scattered bilateral pulmonary nodules, nonspecific. EEG 4/6 suggestive of moderate encephalopathy; no seizure activity. S/p lumbar puncture 4/7. PMH includes HOH, HTN, DM2.   OT comments  Pt. Seen for skilled OT treatment.  Son present and active in pts. Care.  Bed mobility mod a.  Tolerated sitting eob with no lob prior to sit/stand and pivot to recliner.  Less physical assistance today and followed verbal and gestural cues well.  Also able to manage beverage and grooming task with less physical assistance.  Progressing well and remains excellent CIR candidate for continued therapies prior to home.    Follow Up Recommendations  CIR;Supervision/Assistance - 24 hour    Equipment Recommendations  None recommended by OT    Recommendations for Other Services Rehab consult    Precautions / Restrictions Precautions Precautions: Fall Precaution Comments: aphasia       Mobility Bed Mobility Overal bed mobility: Needs Assistance Bed Mobility: Supine to Sit     Supine to sit: Mod assist;HOB elevated     General bed mobility comments: able to bring b les off of bed without physical assistance, followed verbal and gestural cues to complete this task.  mod a to guide trunk upright but able to maintain sitting balance without physical assistance    Transfers Overall transfer level: Needs assistance Equipment used: 1 person hand held assist;None Transfers: Sit to/from [facility] Sit to Stand: Min assist;Mod assist Stand pivot transfers: Min assist;Mod assist       General transfer comment: less physical assistance and lean than noted at previous  session.    Balance                                           ADL either performed or assessed with clinical judgement   ADL Overall ADL's : Needs assistance/impaired Eating/Feeding: Set up;Sitting Eating/Feeding Details (indicate cue type and reason): improvements with bringing cup to mouth.  initial placement then able to complete on his own and also place on tray table in between sips Grooming: Wash/dry face;Bed level;Minimal assistance Grooming Details (indicate cue type and reason): cues for thoroughness                 Toilet Transfer: Moderate assistance;Stand-pivot Toilet Transfer Details (indicate cue type and reason): simulated during transfer from eob to recliner         Functional mobility during ADLs: Moderate assistance General ADL Comments: able to complete bed mobility, and transfer to recliner.  grooming task and some self feeding  (drinking from cup lid/straw)     Vision       Perception     Praxis      Cognition Arousal/Alertness: Awake/alert Behavior During Therapy: WFL for tasks assessed/performed Overall Cognitive Status: Impaired/Different from baseline                                          Exercises     Shoulder Instructions       General  Comments      Pertinent Vitals/ Pain       Pain Assessment: No/denies pain  Home Living                                          Prior Functioning/Environment              Frequency  Min 2X/week        Progress Toward Goals  OT Goals(current goals can now be found in the care plan section)  Progress towards OT goals: Progressing toward goals     Plan Discharge plan remains appropriate    Co-evaluation                 AM-PAC OT "6 Clicks" Daily Activity     Outcome Measure   Help from another person eating meals?: A Little Help from another person taking care of personal grooming?: A Little Help from another  person toileting, which includes using toliet, bedpan, or urinal?: A Lot Help from another person bathing (including washing, rinsing, drying)?: A Lot Help from another person to put on and taking off regular upper body clothing?: A Little Help from another person to put on and taking off regular lower body clothing?: A Lot 6 Click Score: 15    End of Session Equipment Utilized During Treatment: Gait belt  OT Visit Diagnosis: Muscle weakness (generalized) (M62.81);Ataxia, unspecified (R27.0);Unsteadiness on feet (R26.81)   Activity Tolerance Patient tolerated treatment well   Patient Left in chair;with call bell/phone within reach;with family/visitor present   Nurse Communication          Time: 7342-8768 OT Time Calculation (min): 26 min  Charges: OT General Charges $OT Visit: 1 Visit OT Treatments $Self Care/Home Management : 23-37 mins  Sonia Baller, COTA/L Acute Rehabilitation [phone]   11/04/2020, 11:29 AM

## 2020-11-04 NOTE — Progress Notes (Signed)
Physical Therapy Treatment Patient Details Name: ERNIE KASLER MRN: [id number] DOB: [birth year] Today's Date: 11/04/2020    History of Present Illness Pt is an 87 y.o. male admitted 11/01/20 as code stroke with L-side weakness, facial droop, AMS; LKW time at 2100 on 4/4. Head CT unremarkable. Awaiting MRI. CXR with scattered bilateral pulmonary nodules, nonspecific. EEG 4/6 suggestive of moderate encephalopathy; no seizure activity. S/p lumbar puncture 4/7. PMH includes HOH, HTN, DM2.   PT Comments    Pt much more alert and participatory this session. Pt with clearer speech, although still mumbling at times and expressive aphasia apparent. Today's session focused on transfer and gait training with RW, pt requiring mod-maxA to mobilize with RW (+2 safety/chair follow). Pt limited by generalized weakness, impaired coordination, decreased balance strategies/postural reactions and cognitive impairment. Pt following simple commands; easily distracted and expresses frustration regarding current situation. Continue to recommend intensive CIR-level therapies to maximize functional mobility and independence.    Follow Up Recommendations  CIR;Supervision/Assistance - 24 hour     Equipment Recommendations  Rolling walker with 5" wheels;3in1 (PT);Wheelchair (measurements PT);Wheelchair cushion (measurements PT)    Recommendations for Other Services       Precautions / Restrictions Precautions Precautions: Fall Precaution Comments: aphasia Restrictions Weight Bearing Restrictions: No    Mobility  Bed Mobility Overal bed mobility: Needs Assistance Bed Mobility: Supine to Sit     Supine to sit: Mod assist;HOB elevated     General bed mobility comments: received sitting in recliner    Transfers Overall transfer level: Needs assistance Equipment used: Rolling walker (2 wheeled) Transfers: Sit to/from Stand Sit to Stand: Mod assist;Max assist Stand pivot transfers: Min assist;Mod assist        General transfer comment: Pt requiring verbal/tactile cues for hand placement with preparation to stand, pt moving BUEs frequently from RW to recliner and back, then attempting to hold lines; multiple sit<>stands from recliner requiring consistent modA for trunk elevation and to stabilize walker; pt bracing BLEs against recliner when transitioning UE support to RW; poor eccentric control to sit requiring mod-maxA for safe lowering  Ambulation/Gait Ambulation/Gait assistance: Mod assist;Max assist;+2 safety/equipment Gait Distance (Feet): 26 Feet (+12) Assistive device: Rolling walker (2 wheeled) Gait Pattern/deviations: Step-through pattern;Decreased stride length;Staggering right;Staggering left;Trunk flexed;Leaning posteriorly Gait velocity: Decreased   General Gait Details: Slow, very unsteady gait with RW and consistent modA for stability, intermittent LOB in all directions requiring maxA to prevent fall; pt starting to veer outside of RW with LOB requiring cues to take seated rest break; pt taking hands off RW to point at RLE and state it is weak; poor safety awareness; +2 assist for chair follow/safety   Stairs             Wheelchair Mobility    Modified Rankin (Stroke Patients Only) Modified Rankin (Stroke Patients Only) Pre-Morbid Rankin Score: No symptoms Modified Rankin: Moderately severe disability     Balance Overall balance assessment: Needs assistance Sitting-balance support: Feet supported;Bilateral upper extremity supported Sitting balance-Leahy Scale: Poor Sitting balance - Comments: Reliant on UE support to maitnain sitting balance at edge of recliner   Standing balance support: Single extremity supported;During functional activity;Bilateral upper extremity supported Standing balance-Leahy Scale: Poor Standing balance comment: Reliant on UE support and external assist to maintain balance                            Cognition  Arousal/Alertness: Awake/alert Behavior During Therapy: [facility] for tasks  assessed/performed;Restless Overall Cognitive Status: Impaired/Different from baseline Area of Impairment: Following commands;Awareness;Problem solving;Safety/judgement;Attention                   Current Attention Level: Sustained   Following Commands: Follows one step commands inconsistently;Follows one step commands with increased time Safety/Judgement: Decreased awareness of safety;Decreased awareness of deficits Awareness: Intellectual;Emergent Problem Solving: Slow processing;Decreased initiation;Difficulty sequencing;Requires verbal cues General Comments: Much more alert this session, speaking more clearly overall. Still with mumbling speech at times, requires increased time to get words/thoughts out; pt able to voice frustration at end of session regarding current situation ("I was ok... I only had diabetes"). Easily distracted by lines requiring frequent redirection      Exercises      General Comments General comments (skin integrity, edema, etc.): Pt's son present and supportive      Pertinent Vitals/Pain Pain Assessment: Faces Faces Pain Scale: No hurt Pain Intervention(s): Monitored during session    Home Living                      Prior Function            PT Goals (current goals can now be found in the care plan section) Progress towards PT goals: Progressing toward goals    Frequency    Min 4X/week      PT Plan Current plan remains appropriate    Co-evaluation              AM-PAC PT "6 Clicks" Mobility   Outcome Measure  Help needed turning from your back to your side while in a flat bed without using bedrails?: A Lot Help needed moving from lying on your back to sitting on the side of a flat bed without using bedrails?: A Lot Help needed moving to and from a bed to a chair (including a wheelchair)?: A Lot Help needed standing up from a chair using your  arms (e.g., wheelchair or bedside chair)?: A Lot Help needed to walk in hospital room?: A Lot Help needed climbing 3-5 steps with a railing? : Total 6 Click Score: 11    End of Session Equipment Utilized During Treatment: Gait belt Activity Tolerance: Patient tolerated treatment well Patient left: in chair;with call bell/phone within reach;with chair alarm set;with family/visitor present Nurse Communication: Mobility status PT Visit Diagnosis: Unsteadiness on feet (R26.81);Other abnormalities of gait and mobility (R26.89);Muscle weakness (generalized) (M62.81);Other symptoms and signs involving the nervous system (R29.898)     Time: 2703-5009 PT Time Calculation (min) (ACUTE ONLY): 26 min  Charges:  $Gait Training: 8-22 mins $Therapeutic Activity: 8-22 mins                     Mabeline Caras, PT, DPT Acute Rehabilitation Services  Pager [phone] Office [place] 11/04/2020, 12:34 PM

## 2020-11-05 ENCOUNTER — Inpatient Hospital Stay (HOSPITAL_COMMUNITY): Payer: Medicare Other

## 2020-11-05 DIAGNOSIS — J61 Pneumoconiosis due to asbestos and other mineral fibers: Secondary | ICD-10-CM

## 2020-11-05 LAB — BASIC METABOLIC PANEL
Anion gap: 8 (ref 5–15)
BUN: 40 mg/dL — ABNORMAL HIGH (ref 8–23)
CO2: 23 mmol/L (ref 22–32)
Calcium: 7.9 mg/dL — ABNORMAL LOW (ref 8.9–10.3)
Chloride: 106 mmol/L (ref 98–111)
Creatinine, Ser: 1.71 mg/dL — ABNORMAL HIGH (ref 0.61–1.24)
GFR, Estimated: 38 mL/min — ABNORMAL LOW (ref >60)
Glucose, Bld: 100 mg/dL — ABNORMAL HIGH (ref 70–99)
Potassium: 3.7 mmol/L (ref 3.5–5.1)
Sodium: 137 mmol/L (ref 135–145)

## 2020-11-05 LAB — CBC
HCT: 35.9 % — ABNORMAL LOW (ref 39.0–52.0)
Hemoglobin: 12.2 g/dL — ABNORMAL LOW (ref 13.0–17.0)
MCH: 30.5 pg (ref 26.0–34.0)
MCHC: 34 g/dL (ref 30.0–36.0)
MCV: 89.8 fL (ref 80.0–100.0)
Platelets: 160 10*3/uL (ref 150–400)
RBC: 4 MIL/uL — ABNORMAL LOW (ref 4.22–5.81)
RDW: 13.5 % (ref 11.5–15.5)
WBC: 8.2 10*3/uL (ref 4.0–10.5)
nRBC: 0 % (ref 0.0–0.2)

## 2020-11-05 LAB — GLUCOSE, CAPILLARY
Glucose-Capillary: 209 mg/dL — ABNORMAL HIGH (ref 70–99)
Glucose-Capillary: 266 mg/dL — ABNORMAL HIGH (ref 70–99)
Glucose-Capillary: 323 mg/dL — ABNORMAL HIGH (ref 70–99)
Glucose-Capillary: 194 mg/dL — ABNORMAL HIGH (ref 70–99)

## 2020-11-05 MED ORDER — INSULIN GLARGINE 100 UNIT/ML ~~LOC~~ SOLN
30.0000 [IU] | Freq: Every morning | SUBCUTANEOUS | Status: DC
  Administered 2020-11-06 – 2020-11-08 (×3): 30 [IU] via SUBCUTANEOUS
  Filled 2020-11-05 (×3): qty 0.3

## 2020-11-05 NOTE — Progress Notes (Addendum)
Subjective:   No overnight events noted.   This AM, patient seems more awake and alert.  He is able to speak more clearly.  He denies any acute pain at this time.  Family is at bedside and noted that he had slept well without any overnight disturbances.  Objective:  Vital signs in last 24 hours: Vitals:   11/04/20 1705 11/04/20 2100 11/05/20 0014 11/05/20 0428  BP: (!) 176/86 (!) 160/78 (!) 155/85 (!) 173/88  Pulse: 72 73  85  Resp: 18 16  20   Temp: 97.6 F (36.4 C) 98.5 F (36.9 C)  98.7 F (37.1 C)  TempSrc: Oral Oral  Oral  SpO2: 97% 99%  97%  Weight:    73.5 kg  Height:       Physical Exam Constitutional: no acute distress Head: atraumatic ENT: external ears normal, neck is supple with no pain on range of motion Eyes: EOMI, pupils are asymmetric likely due to prior injury and surgeries Cardiovascular: regular rate and rhythm, normal heart sounds Pulmonary: effort normal, lungs clear to ascultation bilaterally Abdominal: bowel sounds normal Skin: warm and dry, no foot ulcers, rash over his shins without broken skin Neurological: alert, improvement in expressive aphasia, able to move all extremities Psychiatric: normal mood and affect  Assessment/Plan: Caleb Dougherty is a 87 y.o. male with hx of HTN, DM2, HLD presenting with acute encephalopathy and R-sided weakness, also found to have fever and blood pressure 212/85 on arrival.   Active Problems:   Altered mental status   Malnutrition of moderate degree   Acute encephalopathy   Fever   Hypertensive crisis  Acute encephalopathy Fever No evidence of bacterial or viral meningitis at this time; HSV PCR negative. On the differential remains CJD versus autoimmune encephalitis; lab test still pending.  Overall, patient continues to improve symptomatically. -Neurology following, appreciate recommendations -Follow-up CSF beta-isoform (CJD study) and viral encephalitis panel -Follow-up blood culture x2, CSF culture.  NGTD -Hold aspirin -Consult for CIR  Hypertensive emergency vs urgency Found to have BP of 210/85 on admission, improved nitroglycerin drip and transitioned to PO hydralazine, dose titrated up to 50mg  TID.  Blood pressure continues to improve today. -Hold off home losartan given AKI -Continue Hydralazine 50mg  TID  Hospital delirium Much improved.  -Reduced lines and nighttime disturbance -seroquel 25mg  at bedtime  AKI Patient continues to have a rise in his creatinine up to 1.7 today.  Urine studies obtained yesterday show a prerenal pattern, no evidence of infection although there was some white blood cells.  Possibly in the setting of acyclovir, although that has been discontinued at this time.  Bladder scan concerning for some urinary retention, in and out cath pursued with 400 cc out.  We will check a renal ultrasound for hydronephrosis. -Renal ultrasound -Bladder scans every 6 -In and out cath as needed for bladder volume over 300 cc. If more than 3 in and out catheter required, insert Foley. -Holding losartan as above -Daily BMP, avoid nephrotoxins  Asbestosis-associated ILD -Recommend outpatient follow-up -No pulmonary symptoms at this time  Insulin dependent type II diabetes A1c of 8.9.  Home Lantus 18 units daily. -Lantus 20 units daily -Continue sliding scale insulin  Diet:  Dysphagia II IVF:  none VTE:  SCD Prior to Admission Living Arrangement:  home Anticipated Discharge Location:  TBD Barriers to Discharge:  Medical workup and HTN management Dispo: Anticipated discharge in approximately 2-3 day(s).   Dr. Jose Persia Internal Medicine PGY-2  Pager: [phone] After 5pm  on weekdays and 1pm on weekends: On Call pager [phone]  11/05/2020, 12:17 PM

## 2020-11-05 NOTE — Plan of Care (Signed)
  Problem: Activity: Goal: Risk for activity intolerance will decrease Outcome: Progressing   Problem: Nutrition: Goal: Adequate nutrition will be maintained Outcome: Progressing   

## 2020-11-05 NOTE — Progress Notes (Signed)
Inpatient Rehab Admissions Coordinator:   I met with Pt. And daughter at bedside to discuss potential CIR admission. Pt. Stated interest. I will have rehab MD review for medical necessity and pursue for CIR if indicated.   Clemens Catholic, [place], [place] Admissions Coordinator  [phone] ([place]) [phone] (office)

## 2020-11-06 LAB — CBC
HCT: 32.8 % — ABNORMAL LOW (ref 39.0–52.0)
Hemoglobin: 11.2 g/dL — ABNORMAL LOW (ref 13.0–17.0)
MCH: 30.9 pg (ref 26.0–34.0)
MCHC: 34.1 g/dL (ref 30.0–36.0)
MCV: 90.4 fL (ref 80.0–100.0)
Platelets: 156 10*3/uL (ref 150–400)
RBC: 3.63 MIL/uL — ABNORMAL LOW (ref 4.22–5.81)
RDW: 13.7 % (ref 11.5–15.5)
WBC: 8.9 10*3/uL (ref 4.0–10.5)
nRBC: 0 % (ref 0.0–0.2)

## 2020-11-06 LAB — CULTURE, BLOOD (ROUTINE X 2)
Culture: NO GROWTH
Culture: NO GROWTH
Special Requests: ADEQUATE

## 2020-11-06 LAB — BASIC METABOLIC PANEL
Anion gap: 12 (ref 5–15)
BUN: 41 mg/dL — ABNORMAL HIGH (ref 8–23)
CO2: 21 mmol/L — ABNORMAL LOW (ref 22–32)
Calcium: 7.9 mg/dL — ABNORMAL LOW (ref 8.9–10.3)
Chloride: 102 mmol/L (ref 98–111)
Creatinine, Ser: 1.79 mg/dL — ABNORMAL HIGH (ref 0.61–1.24)
GFR, Estimated: 36 mL/min — ABNORMAL LOW (ref >60)
Glucose, Bld: 209 mg/dL — ABNORMAL HIGH (ref 70–99)
Potassium: 3.8 mmol/L (ref 3.5–5.1)
Sodium: 135 mmol/L (ref 135–145)

## 2020-11-06 LAB — GLUCOSE, CAPILLARY
Glucose-Capillary: 289 mg/dL — ABNORMAL HIGH (ref 70–99)
Glucose-Capillary: 293 mg/dL — ABNORMAL HIGH (ref 70–99)
Glucose-Capillary: 388 mg/dL — ABNORMAL HIGH (ref 70–99)
Glucose-Capillary: 182 mg/dL — ABNORMAL HIGH (ref 70–99)

## 2020-11-06 LAB — CSF CULTURE W GRAM STAIN
Culture: NO GROWTH
Special Requests: NORMAL

## 2020-11-06 MED ORDER — LACTATED RINGERS IV BOLUS
500.0000 mL | Freq: Once | INTRAVENOUS | Status: AC
  Administered 2020-11-06: 500 mL via INTRAVENOUS

## 2020-11-06 MED ORDER — CYANOCOBALAMIN 1000 MCG/ML IJ SOLN
1000.0000 ug | INTRAMUSCULAR | Status: DC
  Administered 2020-11-06: 1000 ug via INTRAMUSCULAR
  Filled 2020-11-06: qty 1

## 2020-11-06 MED ORDER — ENOXAPARIN SODIUM 30 MG/0.3ML ~~LOC~~ SOLN
30.0000 mg | Freq: Every day | SUBCUTANEOUS | Status: DC
  Administered 2020-11-06 – 2020-11-08 (×3): 30 mg via SUBCUTANEOUS
  Filled 2020-11-06 (×3): qty 0.3

## 2020-11-06 MED ORDER — VITAMIN B-12 1000 MCG PO TABS
1000.0000 ug | ORAL_TABLET | Freq: Every day | ORAL | Status: DC
  Administered 2020-11-07 – 2020-11-08 (×2): 1000 ug via ORAL
  Filled 2020-11-06 (×2): qty 1

## 2020-11-06 NOTE — Progress Notes (Addendum)
Subjective:   Patient sleeping comfortably upon entering room. Per son, his speech and comprehension continue to improved. States he slept well last night. Patient denies pain anywhere. Still with some dysphasia.  Objective:  Vital signs in last 24 hours: Vitals:   11/05/20 0938 11/06/20 0033 11/06/20 0527 11/06/20 0638  BP: (!) 144/67 (!) 147/70 (!) 147/73   Pulse: 93 93 98   Resp: 18 17 16    Temp: 98.3 F (36.8 C) 99.7 F (37.6 C) (!) 100.4 F (38 C) 100 F (37.8 C)  TempSrc: Oral Axillary Oral Oral  SpO2:  92% 95%   Weight:   74.4 kg   Height:       Physical Exam Constitutional: no acute distress Head: atraumatic ENT: external ears normal, neck is supple with no pain on range of motion Eyes: EOMI, pupils are asymmetric due to prior injury and surgeries Cardiovascular: regular rate and rhythm, normal heart sounds Pulmonary: effort normal, lungs clear to ascultation bilaterally Abdominal: bowel sounds normal Skin: warm and dry Neurological: alert, expressive aphasia mildly improved from prior day, no cranial nerve deficits, strength 5/5 throughout, follows commands well Psychiatric: normal mood and affect  Assessment/Plan: Caleb Dougherty is a 87 y.o. male with hx of HTN, DM2, HLD presenting with acute encephalopathy and R-sided weakness, also found to have fever and blood pressure 212/85 on arrival.   Active Problems:   Altered mental status   Malnutrition of moderate degree   Acute encephalopathy   Fever   Hypertensive crisis  Acute encephalopathy Fever No evidence of bacterial or viral meningitis at this time; HSV PCR negative. On the differential remains CJD versus autoimmune encephalitis; lab test still pending.  Overall, patient continues to improve symptomatically. -Neurology following, appreciate recommendations -Follow-up CSF beta-isoform (CJD study), autoimmune encephalitis panel -Follow-up blood culture x2, CSF culture. NGTD -Hold aspirin -Consult  for CIR  AKI Patient continues to have a rise in his creatinine up to 1.7 today.  Urine studies obtained yesterday show a prerenal pattern, no evidence of infection although there was some white blood cells.  Possibly in the setting of acyclovir, although that has been discontinued at this time.  Bladder scan with 400cc overnight but urinated afterwards, did not require I&O catheter overnight. Renal ultrasound negative. -Bladder scans every 6 -In and out cath as needed for bladder volume over 300 cc. If more than 3 in and out catheter required, insert Foley. -Holding losartan as above -Daily BMP, avoid nephrotoxins  Vitamin B12 deficiency B12 level of 189. -Start weekly B12 1000 IM -Start oral B12 1000 tomorrow  Hypertensive emergency vs urgency Found to have BP of 210/85 on admission, improved nitroglycerin drip and transitioned to PO hydralazine, dose titrated up to 50mg  TID.  Blood pressure continues to improve today. -Hold off home losartan given AKI -Continue Hydralazine 50mg  TID  Hospital delirium Much improved.  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  Home Lantus 18 units daily. -increase Lantus to 30 units daily -Continue sliding scale insulin  Diet:  Carb modified IVF:  none VTE:  SCD Prior to Admission Living Arrangement:  home Anticipated Discharge Location:  TBD Barriers to Discharge:  Medical workup and HTN management Dispo: Anticipated discharge in approximately 2-3 day(s).   Dr. Edison Simon Internal Medicine PGY-1 Pager: [phone] After 5pm on  weekdays and 1pm on weekends: On Call pager [phone]  11/06/2020, 6:43 AM

## 2020-11-06 NOTE — Discharge Summary (Signed)
Name: Caleb Dougherty MRN: [id number] DOB: 09/29/1928 87 y.o. PCP: Caleb Pink, MD  Date of Admission: 11/01/2020  9:17 AM Date of Discharge:  11/08/20 Attending Physician: Caleb Ochs, MD   Discharge Diagnosis: 1. Hypertensive emergency with acute encephalopathy 2. Acute kidney injury 3. Vitamin B12 deficiency 4. Hospital delirium 5. Asbestos associated interstitial lung disease  Discharge Medications: Allergies as of 11/08/2020      Reactions   Gabapentin    Indomethacin    Other reaction(s): Edema of lower extremity   Lisinopril Cough   Nortriptyline    Other reaction(s): Edema of lower extremity   Empagliflozin    Other reaction(s): Urinary incontinence      Medication List    TAKE these medications   Alogliptin Benzoate 12.5 MG Tabs Take 12.5 mg by mouth daily.   aspirin EC 81 MG tablet Take 81 mg by mouth daily.   cyanocobalamin 1000 MCG tablet Take 1 tablet (1,000 mcg total) by mouth daily. Start taking on: November 09, 2020   cyanocobalamin 1000 MCG/ML injection Commonly known as: (VITAMIN B-12) Inject 1 mL (1,000 mcg total) into the muscle once a week. Start taking on: November 13, 2020   donepezil 10 MG tablet Commonly known as: ARICEPT Take 5-10 mg by mouth See admin instructions. TAKE ONE-HALF TABLET BY MOUTH ONCE EVERY DAY FOR 1 WEEK, THEN TAKE ONE TABLET ONCE EVERY DAY FOR MEMORY   hydrALAZINE 25 MG tablet Commonly known as: APRESOLINE Take 1 tablet (25 mg total) by mouth every 8 (eight) hours.   insulin glargine 100 UNIT/ML Solostar Pen Commonly known as: LANTUS Inject 18 Units into the skin daily.   losartan 50 MG tablet Commonly known as: COZAAR Take 1 tablet (50 mg total) by mouth daily. Start taking on: November 09, 2020 What changed:   medication strength  how much to take   metFORMIN 500 MG tablet Commonly known as: GLUCOPHAGE Take 1,000 mg by mouth 2 (two) times daily.   QUEtiapine 25 MG tablet Commonly known as:  SEROQUEL Take 1 tablet (25 mg total) by mouth at bedtime.   simvastatin 80 MG tablet Commonly known as: ZOCOR Take 40 mg by mouth at bedtime.   tamsulosin 0.4 MG Caps capsule Commonly known as: FLOMAX Take 0.4 mg by mouth at bedtime.       Disposition and follow-up:   Mr.Caleb Dougherty was discharged from [facility] in Stable condition.  At the hospital follow up visit please address:  1.  Follow up: Marland Kitchen Acute cephalopathy: Etiology not totally clear but suspecting hypertensive emergency, still pending some CSF studies (Beta isoform, autoimmune encephalitis panel), discharging to rehab, follow-up with neurology . Vitamin B2 deficiency: B12 level of 189, started weekly injections as well as oral . Hypertension: Resumed home losartan at 50mg  (home dose 100), increase after ensuring adequate kidney function, hydralazine at 25mg  TID can be discontinued when back at home losartan dose . AKI: FENa consistent with prerenal, he did receive acyclovir this admission, improving by time of discharge . Asbestosis related interstitial lung disease: Demonstrated on CT scan, no pulmonary symptoms or oxygen requirement, subpleural consolidation suspect round atelectasis which should be followed up with chest CT in 3 to 6 months . Hospital associated delirium: improved with delirium precautions and seroquel 25mg . Will continue this as he is going to CIR, can discontinue when he goes home  2.  Labs / imaging needed at time of follow-up: BMP, B12  3.  Pending labs/ test needing  follow-up: Beta isoform (Creutzfeldt-Jakob disease), autoimmune encephalitis panel, methylmalonic acid  Follow-up Appointments:   Hospital Course by problem list:  Acute encephalopathy Fever Hypertensive emergency versus urgency Patient admitted with new aphasia and focal weakness.  Unable to obtain MRI due to metal fragment in scalp, but CT and CTA without evidence of stroke.  Patient was noted for fever of  102.6 on night after admission.  Started on empiric coverage for bacterial and viral meningitis.  Also presented with blood pressure 210/85 which improved with nitroglycerin drip, then transition to oral hydralazine.  Lumbar puncture with elevated protein, no bacteria and negative HCV testing.  Stopped antibiotics and acyclovir.  Patient had progressive improvement in symptoms over course of hospitalization.  Etiology still not totally clear, possibilities include hypertensive emergency, Creutzfeldt-Jakob disease, autoimmune encephalopathy.  Follow-up remainder of labs outpatient.  Hospital delirium Unfortunately during this admission had agitation and hallucinations.  This improved with decreasing lines, delirium precautions, and low dose Seroquel nightly. Continuing seroquel at discharge as he is going to CIR, can discontinue when he goes home.  Acute kidney injury Creatinine increased from 1.0 on admission to 1.7.  FENa of 0.2%, the patient was noted to have adequate oral intake throughout admission.  IV fluids administered.  Improving by day of discharge   Pertinent Labs, Studies, and Procedures:  CT CEREBRAL PERFUSION W CONTRAST  Result Date: 11/01/2020 CLINICAL DATA:  Code stroke follow-up, left-sided facial droop and aphasia EXAM: CT ANGIOGRAPHY HEAD AND NECK CT PERFUSION BRAIN TECHNIQUE: Multidetector CT imaging of the head and neck was performed using the standard protocol during bolus administration of intravenous contrast. Multiplanar CT image reconstructions and MIPs were obtained to evaluate the vascular anatomy. Carotid stenosis measurements (when applicable) are obtained utilizing NASCET criteria, using the distal internal carotid diameter as the denominator. Multiphase CT imaging of the brain was performed following IV bolus contrast injection. Subsequent parametric perfusion maps were calculated using RAPID software. CONTRAST:  171mL OMNIPAQUE IOHEXOL 350 MG/ML SOLN COMPARISON:  None.  FINDINGS: CTA NECK Aortic arch: Great vessel origins are patent. Right carotid system: Patent. Calcified plaque at the common carotid bifurcation and ICA origin causing minimal stenosis. Left carotid system: Patent. Calcified plaque at the common carotid bifurcation and ICA origin causing minimal stenosis. Vertebral arteries: Patent and codominant.  No stenosis. Skeleton: Degenerative changes of the cervical spine. Prominent retrodental soft tissue partially effacing the ventral subarachnoid space. Other neck: Unremarkable. Upper chest: Bilateral calcified pleural plaques. Dependent atelectatic changes. Review of the MIP images confirms the above findings CTA HEAD Anterior circulation: Intracranial internal carotid arteries are patent. Calcified plaque along the distal cavernous and paraclinoid portions causing mild to moderate stenosis. Middle cerebral arteries are patent. There is possible mild irregularity of left M3 MCA branch in the region of the frontal operculum. Anterior cerebral arteries are patent. Posterior circulation: Intracranial vertebral arteries, basilar artery, and posterior cerebral arteries are patent. Major cerebellar artery origins are patent. Bilateral posterior communicating arteries are present. Venous sinuses: Patent as allowed by contrast bolus timing. Review of the MIP images confirms the above findings CT Brain Perfusion Findings: CBF (<30%) Volume: 58mL Perfusion (Tmax>6.0s) volume: 11mL Mismatch Volume: 34mL Infarction Location: None. IMPRESSION: No large vessel occlusion or hemodynamically significant stenosis in the neck. No proximal intracranial vessel occlusion. Possible mild atherosclerotic irregularity of left M3 MCA branch in the region of the frontal operculum. Perfusion imaging demonstrates no evidence of core infarct or penumbra. Initial results were discussed by telephone at the time of interpretation  on 11/01/2020 at 9:43 am to provider Dr. Rory Percy, who verbally acknowledged these  results. Electronically Signed   By: Macy Mis M.D.   On: 11/01/2020 09:58   DG CHEST PORT 1 VIEW  Result Date: 11/02/2020 CLINICAL DATA:  Fever, possible meningitis EXAM: PORTABLE CHEST 1 VIEW COMPARISON:  None. FINDINGS: Cardiac and mediastinal contours are within normal limits. Atherosclerotic calcifications visualized in the transverse aorta. Scattered nodular opacities bilaterally are nonspecific. Linear airspace opacities in the right lung base most consistent with atelectasis or scarring. No pulmonary edema, pleural effusion or pneumothorax. No convincing evidence of pneumonia. Left reverse shoulder arthroplasty. No acute osseous abnormality. IMPRESSION: 1. Scattered bilateral pulmonary nodules are nonspecific. Recommend further evaluation with noncontrast enhanced chest CT. 2. Right basilar atelectasis versus scarring. 3. Aortic atherosclerosis. Electronically Signed   By: Jacqulynn Cadet M.D.   On: 11/02/2020 08:07   EEG adult  Result Date: 11/02/2020 Lora Havens, MD     11/02/2020  5:28 PM Patient Name: DELWIN RACZKOWSKI MRN: [id number] Epilepsy Attending: Lora Havens Referring Physician/Provider: Dr Edison Simon Date: 11/02/2020 Duration: 31.24 mins Patient history: 87 year old male with new onset sudden speech disturbance.  EEG to evaluate for seizures.  Level of alertness: Awake, asleep  AEDs during EEG study: None  Technical aspects: This EEG study was done with scalp electrodes positioned according to the 10-20 International system of electrode placement. Electrical activity was acquired at a sampling rate of 500Hz  and reviewed with a high frequency filter of 70Hz  and a low frequency filter of 1Hz . EEG data were recorded continuously and digitally stored.  Description: No posterior dominant rhythm was seen. Sleep was characterized by sleep spindles (12-14hz ), maximal frontocentral region. EEG showed continuous generalized 3 to 6 Hz theta-delta slowing. Hyperventilation and photic  stimulation were not performed.   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 No seizures or epileptiform discharges were seen throughout the recording. If concern for ictal-interictal activity persists, consider long term monitoring.   Lora Havens   EEG adult  Result Date: 11/01/2020 Lora Havens, MD     11/01/2020 11:08 AM Patient Name: NUNZIO BANET MRN: [id number] Epilepsy Attending: Lora Havens Referring Physician/Provider: Dr Lestine Mount Date: 11/01/2020 Duration: Patient history: 87 year old male with new onset sudden speech disturbance.  EEG to evaluate for seizures. Level of alertness: Awake AEDs during EEG study: None Technical aspects: This EEG study was done with scalp electrodes positioned according to the 10-20 International system of electrode placement. Electrical activity was acquired at a sampling rate of 500Hz  and reviewed with a high frequency filter of 70Hz  and a low frequency filter of 1Hz . EEG data were recorded continuously and digitally stored. Description: No posterior dominant rhythm was seen. EEG showed continuous generalized 3 to 6 Hz theta-delta slowing. Hyperventilation and photic stimulation were not performed.  Of note, parts of study were difficult to interpret due to significant movement artifact. ABNORMALITY - Continuous slow, generalized IMPRESSION: This technically difficult study is suggestive of moderatediffuse encephalopathy, nonspecific etiology. No seizures or epileptiform discharges were seen throughout the recording. Lora Havens   ECHOCARDIOGRAM COMPLETE  Result Date: 11/01/2020    ECHOCARDIOGRAM REPORT   Patient Name:   KAITLYN SKOWRON Date of Exam: 11/01/2020 Medical Rec #:  [id number]       Height:       67.0 in Accession #:    [id number]      Weight:  160.0 lb Date of Birth:  1928-12-08       BSA:          1.839 m Patient Age:    48 years         BP:           214/94 mmHg Patient Gender: M               HR:           74 bpm. Exam Location:  Inpatient Procedure: 2D Echo, Cardiac Doppler and Color Doppler Indications:    CVA  History:        Patient has no prior history of Echocardiogram examinations.                 Signs/Symptoms:Altered Mental Status; Risk Factors:Hypertension                 and Diabetes.  Sonographer:    Dustin Flock Referring Phys: [place]  1. Left ventricular ejection fraction, by estimation, is 60 to 65%. The left ventricle has normal function. The left ventricle has no regional wall motion abnormalities. Left ventricular diastolic parameters are consistent with Grade I diastolic dysfunction (impaired relaxation).  2. Right ventricular systolic function is normal. The right ventricular size is normal. There is mildly elevated pulmonary artery systolic pressure.  3. Left atrial size was mildly dilated.  4. The mitral valve is normal in structure. No evidence of mitral valve regurgitation. No evidence of mitral stenosis.  5. The aortic valve is normal in structure. Aortic valve regurgitation is not visualized. No aortic stenosis is present.  6. The inferior vena cava is dilated in size with <50% respiratory variability, suggesting right atrial pressure of 15 mmHg. FINDINGS  Left Ventricle: Left ventricular ejection fraction, by estimation, is 60 to 65%. The left ventricle has normal function. The left ventricle has no regional wall motion abnormalities. The left ventricular internal cavity size was normal in size. There is  no left ventricular hypertrophy. Left ventricular diastolic parameters are consistent with Grade I diastolic dysfunction (impaired relaxation). Indeterminate filling pressures. Right Ventricle: The right ventricular size is normal. No increase in right ventricular wall thickness. Right ventricular systolic function is normal. There is mildly elevated pulmonary artery systolic pressure.  The tricuspid regurgitant velocity is 2.82  m/s, and with an assumed right atrial pressure of 15 mmHg, the estimated right ventricular systolic pressure is 13.0 mmHg. Left Atrium: Left atrial size was mildly dilated. Right Atrium: Right atrial size was normal in size. Prominent Eustachian valve. Pericardium: There is no evidence of pericardial effusion. Mitral Valve: The mitral valve is normal in structure. No evidence of mitral valve regurgitation. No evidence of mitral valve stenosis. Tricuspid Valve: The tricuspid valve is normal in structure. Tricuspid valve regurgitation is trivial. No evidence of tricuspid stenosis. Aortic Valve: The aortic valve is normal in structure. Aortic valve regurgitation is not visualized. No aortic stenosis is present. Pulmonic Valve: The pulmonic valve was normal in structure. Pulmonic valve regurgitation is not visualized. No evidence of pulmonic stenosis. Aorta: The aortic root is normal in size and structure. Venous: The inferior vena cava is dilated in size with less than 50% respiratory variability, suggesting right atrial pressure of 15 mmHg. IAS/Shunts: No atrial level shunt detected by color flow Doppler.  LEFT VENTRICLE PLAX 2D LVIDd:         5.10 cm  Diastology LVIDs:         3.10 cm  LV  e' medial:    7.51 cm/s LV PW:         1.10 cm  LV E/e' medial:  10.7 LV IVS:        1.00 cm  LV e' lateral:   6.31 cm/s LVOT diam:     2.70 cm  LV E/e' lateral: 12.7 LV SV:         154 LV SV Index:   84 LVOT Area:     5.73 cm  RIGHT VENTRICLE RV Basal diam:  3.00 cm RV S prime:     13.40 cm/s TAPSE (M-mode): 2.5 cm LEFT ATRIUM             Index       RIGHT ATRIUM           Index LA diam:        4.00 cm 2.17 cm/m  RA Area:     11.60 cm LA Vol (A2C):   68.5 ml 37.24 ml/m RA Volume:   27.20 ml  14.79 ml/m LA Vol (A4C):   45.7 ml 24.85 ml/m LA Biplane Vol: 60.4 ml 32.84 ml/m  AORTIC VALVE LVOT Vmax:   115.00 cm/s LVOT Vmean:  77.400 cm/s LVOT VTI:    0.269 m  AORTA Ao Root diam: 3.00  cm MITRAL VALVE                TRICUSPID VALVE MV Area (PHT): 5.54 cm     TR Peak grad:   31.8 mmHg MV Decel Time: 137 msec     TR Vmax:        282.00 cm/s MV E velocity: 80.40 cm/s MV A velocity: 110.00 cm/s  SHUNTS MV E/A ratio:  0.73         Systemic VTI:  0.27 m                             Systemic Diam: 2.70 cm Dani Gobble Croitoru MD Electronically signed by Sanda Klein MD Signature Date/Time: 11/01/2020/4:38:24 PM    Final    CT HEAD CODE STROKE WO CONTRAST  Result Date: 11/01/2020 CLINICAL DATA:  Code stroke. EXAM: CT HEAD WITHOUT CONTRAST TECHNIQUE: Contiguous axial images were obtained from the base of the skull through the vertex without intravenous contrast. COMPARISON:  None. FINDINGS: Brain: There is no acute intracranial hemorrhage, mass effect, or edema. Gray-white differentiation is preserved. Age-indeterminate small vessel infarcts or prominent perivascular spaces of the lentiform nuclei bilaterally. Additional patchy hypoattenuation in the supratentorial white matter is nonspecific but may reflect mild to moderate chronic microvascular ischemic changes. Prominence of the ventricles and sulci reflects generalized parenchymal volume loss. Vascular: No hyperdense vessel. There is intracranial atherosclerotic calcification at the skull base. Skull: Unremarkable Sinuses/Orbits: Minor mucosal thickening. Bilateral lens replacements. Other: Minimal left mastoid tip opacification. Radiopaque foreign body of the anterior left frontal scalp. ASPECTS (Barrett Stroke Program Early CT Score) - Ganglionic level infarction (caudate, lentiform nuclei, internal capsule, insula, M1-M3 cortex): 7 - Supraganglionic infarction (M4-M6 cortex): 3 Total score (0-10 with 10 being normal): 10 IMPRESSION: There is no acute intracranial hemorrhage or evidence of acute infarction. ASPECT score is 10. Age-indeterminate small vessel infarcts or prominent perivascular spaces of the basal ganglia bilaterally. Chronic microvascular  ischemic changes. These results were communicated to Dr. Rory Percy at 9:33 am on 11/01/2020 by text page via the [facility] messaging system. Electronically Signed   By: Macy Mis M.D.   On: 11/01/2020 09:38  CT ANGIO HEAD CODE STROKE  Result Date: 11/01/2020 CLINICAL DATA:  Code stroke follow-up, left-sided facial droop and aphasia EXAM: CT ANGIOGRAPHY HEAD AND NECK CT PERFUSION BRAIN TECHNIQUE: Multidetector CT imaging of the head and neck was performed using the standard protocol during bolus administration of intravenous contrast. Multiplanar CT image reconstructions and MIPs were obtained to evaluate the vascular anatomy. Carotid stenosis measurements (when applicable) are obtained utilizing NASCET criteria, using the distal internal carotid diameter as the denominator. Multiphase CT imaging of the brain was performed following IV bolus contrast injection. Subsequent parametric perfusion maps were calculated using RAPID software. CONTRAST:  144mL OMNIPAQUE IOHEXOL 350 MG/ML SOLN COMPARISON:  None. FINDINGS: CTA NECK Aortic arch: Great vessel origins are patent. Right carotid system: Patent. Calcified plaque at the common carotid bifurcation and ICA origin causing minimal stenosis. Left carotid system: Patent. Calcified plaque at the common carotid bifurcation and ICA origin causing minimal stenosis. Vertebral arteries: Patent and codominant.  No stenosis. Skeleton: Degenerative changes of the cervical spine. Prominent retrodental soft tissue partially effacing the ventral subarachnoid space. Other neck: Unremarkable. Upper chest: Bilateral calcified pleural plaques. Dependent atelectatic changes. Review of the MIP images confirms the above findings CTA HEAD Anterior circulation: Intracranial internal carotid arteries are patent. Calcified plaque along the distal cavernous and paraclinoid portions causing mild to moderate stenosis. Middle cerebral arteries are patent. There is possible mild irregularity of left M3  MCA branch in the region of the frontal operculum. Anterior cerebral arteries are patent. Posterior circulation: Intracranial vertebral arteries, basilar artery, and posterior cerebral arteries are patent. Major cerebellar artery origins are patent. Bilateral posterior communicating arteries are present. Venous sinuses: Patent as allowed by contrast bolus timing. Review of the MIP images confirms the above findings CT Brain Perfusion Findings: CBF (<30%) Volume: 90mL Perfusion (Tmax>6.0s) volume: 25mL Mismatch Volume: 43mL Infarction Location: None. IMPRESSION: No large vessel occlusion or hemodynamically significant stenosis in the neck. No proximal intracranial vessel occlusion. Possible mild atherosclerotic irregularity of left M3 MCA branch in the region of the frontal operculum. Perfusion imaging demonstrates no evidence of core infarct or penumbra. Initial results were discussed by telephone at the time of interpretation on 11/01/2020 at 9:43 am to provider Dr. Rory Percy, who verbally acknowledged these results. Electronically Signed   By: Macy Mis M.D.   On: 11/01/2020 09:58   CT ANGIO NECK CODE STROKE  Result Date: 11/01/2020 CLINICAL DATA:  Code stroke follow-up, left-sided facial droop and aphasia EXAM: CT ANGIOGRAPHY HEAD AND NECK CT PERFUSION BRAIN TECHNIQUE: Multidetector CT imaging of the head and neck was performed using the standard protocol during bolus administration of intravenous contrast. Multiplanar CT image reconstructions and MIPs were obtained to evaluate the vascular anatomy. Carotid stenosis measurements (when applicable) are obtained utilizing NASCET criteria, using the distal internal carotid diameter as the denominator. Multiphase CT imaging of the brain was performed following IV bolus contrast injection. Subsequent parametric perfusion maps were calculated using RAPID software. CONTRAST:  168mL OMNIPAQUE IOHEXOL 350 MG/ML SOLN COMPARISON:  None. FINDINGS: CTA NECK Aortic arch: Great  vessel origins are patent. Right carotid system: Patent. Calcified plaque at the common carotid bifurcation and ICA origin causing minimal stenosis. Left carotid system: Patent. Calcified plaque at the common carotid bifurcation and ICA origin causing minimal stenosis. Vertebral arteries: Patent and codominant.  No stenosis. Skeleton: Degenerative changes of the cervical spine. Prominent retrodental soft tissue partially effacing the ventral subarachnoid space. Other neck: Unremarkable. Upper chest: Bilateral calcified pleural plaques. Dependent atelectatic changes. Review  of the MIP images confirms the above findings CTA HEAD Anterior circulation: Intracranial internal carotid arteries are patent. Calcified plaque along the distal cavernous and paraclinoid portions causing mild to moderate stenosis. Middle cerebral arteries are patent. There is possible mild irregularity of left M3 MCA branch in the region of the frontal operculum. Anterior cerebral arteries are patent. Posterior circulation: Intracranial vertebral arteries, basilar artery, and posterior cerebral arteries are patent. Major cerebellar artery origins are patent. Bilateral posterior communicating arteries are present. Venous sinuses: Patent as allowed by contrast bolus timing. Review of the MIP images confirms the above findings CT Brain Perfusion Findings: CBF (<30%) Volume: 2mL Perfusion (Tmax>6.0s) volume: 23mL Mismatch Volume: 75mL Infarction Location: None. IMPRESSION: No large vessel occlusion or hemodynamically significant stenosis in the neck. No proximal intracranial vessel occlusion. Possible mild atherosclerotic irregularity of left M3 MCA branch in the region of the frontal operculum. Perfusion imaging demonstrates no evidence of core infarct or penumbra. Initial results were discussed by telephone at the time of interpretation on 11/01/2020 at 9:43 am to provider Dr. Rory Percy, who verbally acknowledged these results. Electronically Signed   By:  Macy Mis M.D.   On: 11/01/2020 09:58   Blood culture x2 11/01/20: negative CSF culture with gram stain 11/03/20: no organisms seen, WBC present, culture negative Urine culture 11/01/20: multiple species CSF HSV PCR: negative CSF cell count: 2 RBC, 2 WBC CSF glucose: 93 CSF protein: 88  Discharge Instructions: Discharge Instructions    Call MD for:  persistant nausea and vomiting   Complete by: As directed    Call MD for:  temperature >100.4   Complete by: As directed    Diet - low sodium heart healthy   Complete by: As directed    Discharge instructions   Complete by: As directed    Mr. Swiderski, it was a pleasure taking care of you. You were admitted for acute mental status change which has improved.  At this time, most common cause is extremely elevated blood pressure which has improved.  Your discharge instructions.  1.  For your blood pressure, we are going to titrate back up to your home losartan dose.  You are discharged with 50 mg daily. The goal is to go back to 100mg . This may happen at [facility] or with your PCP. You were discharged also with 25 mg hydralazine 3 times a day, can stop this once you are back to your full losartan dose. 2.  Continue daily vitamin B12.  You also need weekly injections for 4 weeks, after that follow-up with your primary care doctor to recheck your B12 level. 3.  Continue Seroquel 25 mg at bedtime while you are at the rehab facility.  This helped with your delirium in the hospital.  Your primary care doctor may stop this when you are back home. 4.  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  There is no particular treatment at this time as you are breathing well.  Follow-up with a CT scan in 3 to 6 months.   Increase activity slowly   Complete by: As directed       Signed: Andrew Au, MD 11/08/2020, 4:03 PM   Pager: [phone]

## 2020-11-06 NOTE — PMR Pre-admission (Signed)
PMR Admission Coordinator Pre-Admission Assessment  Patient: Caleb Dougherty is an 87 y.o., male MRN: [id number] DOB: Sep 20, 1928 Height: '5\' 8"'  (172.7 cm) Weight: 74.4 kg              Insurance Information HMO: yes    PPO:      PCP:      IPA:      80/20:      OTHER:  PRIMARY: U[employer] Medicare      Policy#: [id number]      Subscriber: Pt. CM Name:opened online       Phone#: [email or web address]    Fax#: [phone] Pre-Cert#: [id number]      Employer: Benefits:  Phone #:   Name:  Colletta [place] with [facility] called to give authorization on 4/12 for 7 day admission beginning 4/12. Clinical updates are due 7 days from admission, to fax # [phone]. Phone number Is [phone] Eff Date: 07/30/2020 - present Deductible: $0 (does not have deductible) OOP Max: $3,600 ($0 met) CIR: $295/day co-pay for days 1-5, $0/day co-pay for days 6+ SNF: $0.00 Copayment per day for days 1-20; $188.00 Copayment per day for days 21-40; $0.00 Copayment per day for days 41-100 for Medicare-covered care/maximum 100 days/benefit period Outpatient: $20/visit co-pay Home Health:  100% coverage, 0% co-insurance; limited by medical necessity DME: 80% coverage; 20% co-insurance  Providers: In network SECONDARY: none       Policy#: none      Phone#: none  The "Data Collection Information Summary" for patients in Inpatient Rehabilitation Facilities with attached "Privacy Act [place] Records" was provided and verbally reviewed with: Family  Emergency Contact Information Contact Information    Name Relation Home Work [place] Son   [phone]   Daquawn, Seelman [phone]  [phone]   Demoni, Gergen [phone]  [phone]     Current Medical History  Patient Admitting Diagnosis: AMS History of Present Illness: Pt is an 87 y.o. male admitted 11/01/20 as code stroke with L-side weakness, facial droop, AMS; LKW time at 2100 on 4/4. Head CT unremarkable. Awaiting MRI. CXR with  scattered bilateral pulmonary nodules, nonspecific. EEG 4/6 suggestive of moderate encephalopathy; no seizure activity. S/p lumbar puncture 4/7. PMH includes HOH, HTN, DM2.  Complete NIHSS TOTAL: 7 Glasgow Coma Scale Score: 14  Past Medical History  Past Medical History:  Diagnosis Date  . Arthritis    osteoarthritis-knees and feet  . BPH (benign prostatic hypertrophy)   . Cancer [facility])    melanoma(back)-no futher issues. Skin cancers face "frozen"  . Cataract    02-12-14 left- ready for surgery, but only has had right done  . Diabetes mellitus without complication ([place])    diabetes x4 yrs-oral med and Lantus used  . HOH (hard of hearing)    wears bilateral hearing aids  . Hypertension   . Impaired hearing    hard of hearing -no hearing aids  . Urgency of urination   . Wears dentures    full lower  . Wears glasses    reading    Family History  family history is not on file.  Prior Rehab/Hospitalizations:  Has the patient had prior rehab or hospitalizations prior to admission? Yes  Has the patient had major surgery during 100 days prior to admission? No  Current Medications   Current Facility-Administered Medications:  .   stroke: mapping our early stages of recovery book, , Does not apply, Once, [place], Carley D, DO .  acetaminophen (TYLENOL) tablet 650 mg, 650 mg, Oral, Q4H PRN **  OR** acetaminophen (TYLENOL) 160 MG/5ML solution 650 mg, 650 mg, Per Tube, Q4H PRN **OR** acetaminophen (TYLENOL) suppository 650 mg, 650 mg, Rectal, Q4H PRN, [place], Carley D, DO, 650 mg at 11/01/20 2253 .  cyanocobalamin ((VITAMIN B-12)) injection 1,000 mcg, 1,000 mcg, Intramuscular, Weekly, Andrew Au, MD .  enoxaparin (LOVENOX) injection 30 mg, 30 mg, Subcutaneous, Daily, Andrew Au, MD, 30 mg at 11/06/20 7425 .  feeding supplement (ENSURE ENLIVE / ENSURE PLUS) liquid 237 mL, 237 mL, Oral, BID BM, Velna Ochs, MD, 237 mL at 11/06/20 0826 .  hydrALAZINE (APRESOLINE)  tablet 50 mg, 50 mg, Oral, Q8H, Sanjuan Dame, MD, 50 mg at 11/06/20 0615 .  insulin aspart (novoLOG) injection 0-15 Units, 0-15 Units, Subcutaneous, TID WC, Jose Persia, MD, 8 Units at 11/06/20 [phone] .  insulin glargine (LANTUS) injection 30 Units, 30 Units, Subcutaneous, q morning, Jose Persia, MD, 30 Units at 11/06/20 0827 .  lactated ringers bolus 500 mL, 500 mL, Intravenous, Once, Bridgett Larsson, Mauri Reading, MD .  lidocaine (PF) (XYLOCAINE) 1 % injection 5 mL, 5 mL, Infiltration, Once, Amie Portland, MD .  QUEtiapine (SEROQUEL) tablet 25 mg, 25 mg, Oral, QHS, Andrew Au, MD, 25 mg at 11/05/20 2146 .  [START ON 11/07/2020] vitamin B-12 (CYANOCOBALAMIN) tablet 1,000 mcg, 1,000 mcg, Oral, Daily, Bridgett Larsson Mauri Reading, MD  Patients Current Diet:  Diet Order            Diet Carb Modified Fluid consistency: Thin; Room service appropriate? Yes with Assist  Diet effective now                 Precautions / Restrictions Precautions Precautions: Fall Precaution Comments: aphasia Restrictions Weight Bearing Restrictions: No   Has the patient had 2 or more falls or a fall with injury in the past year?Yes  Prior Activity Level Community (5-7x/wk): Pt. was active in the community PTA  Prior Functional Level Prior Function Level of Independence: Independent with assistive device(s) Comments: pt ambulates in the home without device, utilizies cane in community on uneven surfaces, driving, likes to do woodwork  Self Care: Did the patient need help bathing, dressing, using the toilet or eating?  Independent  Indoor Mobility: Did the patient need assistance with walking from room to room (with or without device)? Independent  Stairs: Did the patient need assistance with internal or external stairs (with or without device)? Independent  Functional Cognition: Did the patient need help planning regular tasks such as shopping or remembering to take medications? Needed some help  Home Assistive  Devices / [place] Devices/Equipment: Gilford Rile (specify type) (front, didn't need until 2 weeks ago) Home Equipment: Cane - single point,Wheelchair - manual,Shower seat - built in,Grab bars - toilet,Grab bars - tub/shower,Hand held shower head  Prior Device Use: Indicate devices/aids used by the patient prior to current illness, exacerbation or injury? None of the above  Current Functional Level Cognition  Overall Cognitive Status: Impaired/Different from baseline Current Attention Level: Sustained Orientation Level: Oriented to person,Oriented to place,Oriented to time Following Commands: Follows one step commands inconsistently,Follows one step commands with increased time Safety/Judgement: Decreased awareness of safety,Decreased awareness of deficits General Comments: Much more alert this session, speaking more clearly overall. Still with mumbling speech at times, requires increased time to get words/thoughts out; pt able to voice frustration at end of session regarding current situation ("I was ok... I only had diabetes"). Easily distracted by lines requiring frequent redirection    Extremity Assessment (includes Sensation/Coordination)  Upper Extremity  Assessment: RUE deficits/detail,LUE deficits/detail,Generalized weakness RUE Deficits / Details: Pt has difficulties grasping and motor planning fine motor movements. Pt can complete gross motor grasps with decreased strength. RUE Sensation: WNL RUE Coordination: decreased fine motor,decreased gross motor LUE Deficits / Details: Pt has difficulties grasping and motor planning fine motor movements. Pt can complete gross motor grasps with decreased strength. LUE Sensation: WNL LUE Coordination: decreased fine motor,decreased gross motor  Lower Extremity Assessment: Defer to PT evaluation RLE Coordination: decreased gross motor LLE Coordination: decreased gross motor    ADLs  Overall ADL's : Needs  assistance/impaired Eating/Feeding: Set up,Sitting Eating/Feeding Details (indicate cue type and reason): improvements with bringing cup to mouth.  initial placement then able to complete on his own and also place on tray table in between sips Grooming: Wash/dry face,Bed level,Minimal assistance Grooming Details (indicate cue type and reason): cues for thoroughness Upper Body Dressing : Minimal assistance,Sitting Upper Body Dressing Details (indicate cue type and reason): Pt began attempting to doff gown by pulling it, when OT assisted donning gown, pt required tactile cues with where to put his hands through the arms of the gown. Lower Body Dressing: Maximal assistance Lower Body Dressing Details (indicate cue type and reason): Pt unable to reach feet to put socks on or initially place underwear on. Toilet Transfer: Moderate assistance,Stand-pivot Toilet Transfer Details (indicate cue type and reason): simulated during transfer from eob to recliner Functional mobility during ADLs: Moderate assistance General ADL Comments: able to complete bed mobility, and transfer to recliner.  grooming task and some self feeding  (drinking from cup lid/straw)    Mobility  Overal bed mobility: Needs Assistance Bed Mobility: Supine to Sit Supine to sit: Mod assist,HOB elevated Sit to supine: Max assist General bed mobility comments: received sitting in recliner    Transfers  Overall transfer level: Needs assistance Equipment used: Rolling walker (2 wheeled) Transfers: Sit to/from Stand Sit to Stand: Mod assist,Max assist Stand pivot transfers: Min assist,Mod assist General transfer comment: Pt requiring verbal/tactile cues for hand placement with preparation to stand, pt moving BUEs frequently from RW to recliner and back, then attempting to hold lines; multiple sit<>stands from recliner requiring consistent modA for trunk elevation and to stabilize walker; pt bracing BLEs against recliner when  transitioning UE support to RW; poor eccentric control to sit requiring mod-maxA for safe lowering    Ambulation / Gait / Stairs / Wheelchair Mobility  Ambulation/Gait Ambulation/Gait assistance: Mod assist,Max assist,+2 safety/equipment Gait Distance (Feet): 26 Feet (+12) Assistive device: Rolling walker (2 wheeled) Gait Pattern/deviations: Step-through pattern,Decreased stride length,Staggering right,Staggering left,Trunk flexed,Leaning posteriorly General Gait Details: Slow, very unsteady gait with RW and consistent modA for stability, intermittent LOB in all directions requiring maxA to prevent fall; pt starting to veer outside of RW with LOB requiring cues to take seated rest break; pt taking hands off RW to point at RLE and state it is weak; poor safety awareness; +2 assist for chair follow/safety Gait velocity: Decreased Gait velocity interpretation: <1.31 ft/sec, indicative of household ambulator    Posture / Balance Dynamic Sitting Balance Sitting balance - Comments: Reliant on UE support to maitnain sitting balance at edge of recliner Balance Overall balance assessment: Needs assistance Sitting-balance support: Feet supported,Bilateral upper extremity supported Sitting balance-Leahy Scale: Poor Sitting balance - Comments: Reliant on UE support to maitnain sitting balance at edge of recliner Postural control: Left lateral lean,Posterior lean Standing balance support: Single extremity supported,During functional activity,Bilateral upper extremity supported Standing balance-Leahy Scale: Poor Standing balance comment:  Reliant on UE support and external assist to maintain balance    Special needs/care consideration Skin Abrasion and Ecchymosis to BLEs and BUEs     Previous Home Environment (from acute therapy documentation) Living Arrangements: Spouse/significant other Available Help at Discharge: Family,Available PRN/intermittently Type of Home: House Home Layout: One  level Home Access: Ramped entrance Bathroom Shower/Tub: Multimedia programmer: Standard Bathroom Accessibility: Yes How Accessible: Accessible via walker Quinn: No Additional Comments: Pt takes care of wife with dementia  Discharge Living Setting Plans for Discharge Living Setting: Patient's home Type of Home at Discharge: House Discharge Home Layout: One level Discharge Home Access: [place] entrance Discharge Bathroom Shower/Tub: Walk-in shower Discharge Bathroom Toilet: Standard Discharge Bathroom Accessibility: Yes How Accessible: Accessible via walker Does the patient have any problems obtaining your medications?: No  Social/Family/Support Systems Patient Roles: Other (Comment) Contact Information: [phone] Anticipated Caregiver: Milford Cilento (son) Anticipated Caregiver's Contact Information: [phone] Caregiver Availability: 24/7 Discharge Plan Discussed with Primary Caregiver: Yes Is Caregiver In Agreement with Plan?: Yes   Goals Patient/Family Goal for Rehab: PT/OT/SLP min A Expected length of stay: 18-21 days Pt/Family Agrees to Admission and willing to participate: Yes Program Orientation Provided & Reviewed with Pt/Caregiver Including Roles  & Responsibilities: Yes   Decrease burden of Care through IP rehab admission: Specialzed equipment needs, Decrease number of caregivers, Bowel and bladder program and Patient/family education   Possible need for SNF placement upon discharge: not anticipated   Patient Condition: I have reviewed medical records from [facility] , spoken with CSW, and patient, son and daughter. I met with patient at the bedside for inpatient rehabilitation assessment.  Patient will benefit from ongoing PT and OT, can actively participate in 3 hours of therapy a day 5 days of the week, and can make measurable gains during the admission.  Patient will also benefit from the coordinated team approach  during an Inpatient Acute Rehabilitation admission.  The patient will receive intensive therapy as well as Rehabilitation physician, nursing, social worker, and care management interventions.  Due to bladder management, bowel management, safety, skin/wound care, disease management, medication administration, pain management and patient education the patient requires 24 hour a day rehabilitation nursing.  The patient is currently min A-Max A with mobility and basic ADLs.  Discharge setting and therapy post discharge at home with home health is anticipated.  Patient has agreed to participate in the Acute Inpatient Rehabilitation Program and will admit today.  Preadmission Screen Completed By:  Genella Mech, CCC-SLP, 11/06/2020 11:47 AM ______________________________________________________________________   Discussed status with Dr. Dagoberto Ligas on 11/08/20 at 1600 and received approval for admission today.  Admission Coordinator:  Genella Mech, time 11/08/20 Sudie Grumbling 1600
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
## 2020-11-06 NOTE — Progress Notes (Signed)
Inpatient Rehab Admissions Coordinator:   I spoke with Pt.'s son, Lake Bells, over the phone and he confirmed that he can provide 24/7 support at discharge from [facility] and states that he would like to pursue CIR admission for patient. I will await updated therapy notes and open insurance on Monday.   Clemens Catholic, [place], [place] Admissions Coordinator  [phone] ([place]) [phone] (office)

## 2020-11-07 DIAGNOSIS — M542 Cervicalgia: Secondary | ICD-10-CM

## 2020-11-07 LAB — BASIC METABOLIC PANEL
Anion gap: 9 (ref 5–15)
BUN: 39 mg/dL — ABNORMAL HIGH (ref 8–23)
CO2: 22 mmol/L (ref 22–32)
Calcium: 8.1 mg/dL — ABNORMAL LOW (ref 8.9–10.3)
Chloride: 104 mmol/L (ref 98–111)
Creatinine, Ser: 1.61 mg/dL — ABNORMAL HIGH (ref 0.61–1.24)
GFR, Estimated: 41 mL/min — ABNORMAL LOW (ref >60)
Glucose, Bld: 147 mg/dL — ABNORMAL HIGH (ref 70–99)
Potassium: 4 mmol/L (ref 3.5–5.1)
Sodium: 135 mmol/L (ref 135–145)

## 2020-11-07 LAB — CBC
HCT: 34.7 % — ABNORMAL LOW (ref 39.0–52.0)
Hemoglobin: 11.6 g/dL — ABNORMAL LOW (ref 13.0–17.0)
MCH: 30.9 pg (ref 26.0–34.0)
MCHC: 33.4 g/dL (ref 30.0–36.0)
MCV: 92.3 fL (ref 80.0–100.0)
Platelets: 172 10*3/uL (ref 150–400)
RBC: 3.76 MIL/uL — ABNORMAL LOW (ref 4.22–5.81)
RDW: 13.9 % (ref 11.5–15.5)
WBC: 8.1 10*3/uL (ref 4.0–10.5)
nRBC: 0 % (ref 0.0–0.2)

## 2020-11-07 LAB — GLUCOSE, CAPILLARY
Glucose-Capillary: 174 mg/dL — ABNORMAL HIGH (ref 70–99)
Glucose-Capillary: 367 mg/dL — ABNORMAL HIGH (ref 70–99)
Glucose-Capillary: 324 mg/dL — ABNORMAL HIGH (ref 70–99)
Glucose-Capillary: 398 mg/dL — ABNORMAL HIGH (ref 70–99)

## 2020-11-07 NOTE — Progress Notes (Signed)
  Speech Language Pathology Treatment: Dysphagia;Cognitive-Linquistic  Patient Details Name: Caleb Dougherty MRN: [id number] DOB: 1929-05-13 Today's Date: 11/07/2020 Time: 1036-1100 SLP Time Calculation (min) (ACUTE ONLY): 24 min  Assessment / Plan / Recommendation Clinical Impression  Intermittent throat clearing occurred during PO trials, but no overt coughing was noted. Recommend continuation of his current diet. He was also seen for cognitive-linguistic treatment in which he was noted to more fluently converse at the phrase level with intermittent complete sentences. He named 5/5 objects with independence so SLP branched up to a divergent naming task. During this task, he independently named 7 animals but with verbal cues from the SLP, he named 9 more animals. He especially benefited from contextual cues whereas phonemic cues were ineffective as he stated "I'm not understanding." He also demonstrated phonemic errors during these naming tasks (e.g. snake became state; he also verbally clarified himself by spelling out snake as "snakke"). This pt would continue to benefit from speech therapy to assist him in his areas of difficulty.    HPI HPI: Pt is an 87 y.o. male presenting to [facility] ED on 11/01/2020 as a code stroke from home. Pt with R weakness, L gaze. CT imaging is unremarkable. CXR 4/6: Scattered bilateral pulmonary nodules are nonspecific. PMH includes HOH, HTN, and type II DM.  MRI could not be completed, further workup is pending with LP planned 4/7      SLP Plan  Continue with current plan of care       Recommendations  Diet recommendations: Regular;Thin liquid Liquids provided via: Cup;Straw Medication Administration: Whole meds with puree Supervision: Patient able to self feed;Intermittent supervision to cue for compensatory strategies Compensations: Minimize environmental distractions;Slow rate;Small sips/bites Postural Changes and/or Swallow Maneuvers: Seated upright 90 degrees                 Oral Care Recommendations: Oral care BID Follow up Recommendations: Inpatient Rehab SLP Visit Diagnosis: Dysphagia, unspecified (R13.10);Aphasia (R47.01) Plan: Continue with current plan of care       GO                Jeanine Luz., SLP Student 11/07/2020, 11:43 AM

## 2020-11-07 NOTE — Care Management Important Message (Signed)
Important Message  Patient Details  Name: Caleb Dougherty MRN: [id number] Date of Birth: [birth year]   Medicare Important Message Given:  Yes     Shelda Altes 11/07/2020, 10:08 AM

## 2020-11-07 NOTE — Progress Notes (Signed)
Occupational Therapy Treatment Patient Details Name: Caleb Dougherty MRN: [id number] DOB: [birth year] Today's Date: 11/07/2020    History of present illness Pt is an 87 y.o. male admitted 11/01/20 as code stroke with L-side weakness, facial droop, AMS; LKW time at 2100 on 4/4. Head CT unremarkable. Awaiting MRI. CXR with scattered bilateral pulmonary nodules, nonspecific. EEG 4/6 suggestive of moderate encephalopathy; no seizure activity. S/p lumbar puncture 4/7. PMH includes HOH, HTN, DM2.   OT comments  Pt demonstrated increased functional mobility this session. Completing simulated toilet transfers to a chair and back x2 using a RW and min-mod assist for safety. Pt mobility is shaky, however he is able to concentrate on his movements and complete functional tasks with increased time and assistance. Pt is progressing towards his goals and OT will continue to follow acutely.    Follow Up Recommendations  CIR;Supervision/Assistance - 24 hour    Equipment Recommendations  None recommended by OT    Recommendations for Other Services      Precautions / Restrictions Precautions Precautions: Fall Precaution Comments: aphasia       Mobility Bed Mobility Overal bed mobility: Needs Assistance Bed Mobility: Supine to Sit     Supine to sit: Supervision;HOB elevated     General bed mobility comments: Pt needs verbal cues for initiation.    Transfers Overall transfer level: Needs assistance Equipment used: Rolling walker (2 wheeled) Transfers: Sit to/from [facility] Sit to Stand: Min assist Stand pivot transfers: Mod assist       General transfer comment: Pt requiring verbal/tactile cues for hand placement with  standing. Pt requiring verbal cueing for sequencing stand pivot transfer to standard chair and back to bed.    Balance Overall balance assessment: Needs assistance Sitting-balance support: Feet supported;No upper extremity supported Sitting balance-Leahy  Scale: Fair     Standing balance support: Bilateral upper extremity supported;Single extremity supported;During functional activity Standing balance-Leahy Scale: Fair Standing balance comment: Reliant on UE support to maintain balance                           ADL either performed or assessed with clinical judgement   ADL Overall ADL's : Needs assistance/impaired Eating/Feeding: Supervision/ safety Eating/Feeding Details (indicate cue type and reason): Pt able to open all containers, including a can of soda independently, and utilize utensils with no assistance. Grooming: Wash/dry hands;Set up;Sitting Grooming Details (indicate cue type and reason): while sitting in chair, pt used a washcloth to wash his hands.                 Toilet Transfer: Minimal Scientist, forensic Details (indicate cue type and reason): simulated during transfer from eob to standard chair with arm rests                 Vision       Perception     Praxis      Cognition Arousal/Alertness: Awake/alert Behavior During Therapy: WFL for tasks assessed/performed Overall Cognitive Status: Impaired/Different from baseline Area of Impairment: Following commands;Awareness;Problem solving;Safety/judgement;Attention                   Current Attention Level: Sustained   Following Commands: Follows one step commands consistently;Follows one step commands with increased time Safety/Judgement: Decreased awareness of safety Awareness: Intellectual;Emergent Problem Solving: Slow processing;Decreased initiation General Comments: Much more alert this session, speaking more clearly overall. Still with mumbling speech at times, requires increased time to get  words/thoughts out.        Exercises Exercises: General Upper Extremity General Exercises - Upper Extremity Shoulder Flexion: AROM;10 reps (Ot providing resistance) Shoulder ABduction: AROM;10 reps (OT providing  resistance) Elbow Flexion: AROM;10 reps (OT providing resistance) Elbow Extension: AROM;10 reps (OT providing resistance.)   Shoulder Instructions       General Comments VSS on RA    Pertinent Vitals/ Pain       Pain Assessment: No/denies pain  Home Living                                          Prior Functioning/Environment              Frequency  Min 2X/week        Progress Toward Goals  OT Goals(current goals can now be found in the care plan section)  Progress towards OT goals: Progressing toward goals  Acute Rehab OT Goals Patient Stated Goal: To go home and see his wife. OT Goal Formulation: With patient Time For Goal Achievement: 11/16/20 Potential to Achieve Goals: Good ADL Goals Pt Will Perform Grooming: with modified independence;standing Pt Will Perform Upper Body Dressing: with modified independence;sitting Pt Will Perform Lower Body Dressing: with min assist;sitting/lateral leans;sit to/from stand;with adaptive equipment Pt Will Transfer to Toilet: with modified independence;bedside commode Pt Will Perform Toileting - Clothing Manipulation and hygiene: with supervision;sitting/lateral leans;sit to/from stand  Plan Discharge plan remains appropriate;Frequency remains appropriate    Co-evaluation                 AM-PAC OT "6 Clicks" Daily Activity     Outcome Measure   Help from another person eating meals?: None Help from another person taking care of personal grooming?: A Little Help from another person toileting, which includes using toliet, bedpan, or urinal?: A Lot Help from another person bathing (including washing, rinsing, drying)?: A Lot Help from another person to put on and taking off regular upper body clothing?: A Little Help from another person to put on and taking off regular lower body clothing?: A Lot 6 Click Score: 16    End of Session Equipment Utilized During Treatment: Gait belt;Rolling  walker  OT Visit Diagnosis: Muscle weakness (generalized) (M62.81);Unsteadiness on feet (R26.81);Other abnormalities of gait and mobility (R26.89)   Activity Tolerance Patient tolerated treatment well   Patient Left in bed;with call bell/phone within reach;with bed alarm set;with nursing/sitter in room   Nurse Communication Mobility status        Time: 5643-3295 OT Time Calculation (min): 18 min  Charges: OT General Charges $OT Visit: 1 Visit OT Treatments $Self Care/Home Management : 8-22 mins  Kingsten Enfield H., OTR/L Acute Rehabilitation  Ritchard Paragas Elane Danell Verno 11/07/2020, 1:44 PM
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
## 2020-11-07 NOTE — Progress Notes (Signed)
Physical Therapy Treatment Patient Details Name: Caleb Dougherty MRN: [id number] DOB: 1928/11/15 Today's Date: 11/07/2020    History of Present Illness Pt is an 87 y.o. male admitted 11/01/20 as code stroke with L-side weakness, facial droop, AMS; LKW time at 2100 on 4/4. Head CT unremarkable. Awaiting MRI. CXR with scattered bilateral pulmonary nodules, nonspecific. EEG 4/6 suggestive of moderate encephalopathy; no seizure activity. S/p lumbar puncture 4/7. PMH includes HOH, HTN, DM2.   PT Comments    Pt progressing with cognition and mobility. Today's session focused on transfer and gait training, pt requiring mod-maxA for standing activity with RW. Pt limited by generalized weakness, poor balance strategies/postural reactions, uncoordinated movements and cognitive impairment, including decreased problem solving, poor attention, apraxia(?); pt at high risk for falls. Pt motivated to participate with supportive family. Continue to recommend intensive CIR-level therapies to maximize functional mobility and independence prior to return home.    Follow Up Recommendations  CIR;Supervision/Assistance - 24 hour     Equipment Recommendations  Rolling walker with 5" wheels;3in1 (PT);Wheelchair (measurements PT);Wheelchair cushion (measurements PT)    Recommendations for Other Services       Precautions / Restrictions Precautions Precautions: Fall;Other (comment) Precaution Comments: Bladder/bowel incontinence Restrictions Weight Bearing Restrictions: No    Mobility  Bed Mobility Overal bed mobility: Needs Assistance Bed Mobility: Supine to Sit     Supine to sit: Supervision;HOB elevated     General bed mobility comments: Verbal cues to initiate task; use of bed rail; increased time and effort    Transfers Overall transfer level: Needs assistance Equipment used: Rolling walker (2 wheeled);1 person hand held assist Transfers: Sit to/from Stand Sit to Stand: Mod assist;Max  assist Stand pivot transfers: Mod assist       General transfer comment: Pt performed multiple sit<>stands from EOB and recliner with and without RW; pt requiring repeated verbal cues for sequencing and hand placement with every trial, pt tremulous/fidgeting(?) when initiating task; fluctuating between heavy mod-maxA for trunk elevation and stability when transitioning UE support to RW;  Ambulation/Gait Ambulation/Gait assistance: Mod assist;Max assist Gait Distance (Feet): 8 Feet Assistive device: 1 person hand held assist;Rolling walker (2 wheeled) Gait Pattern/deviations: Step-to pattern;Staggering left;Staggering right;Leaning posteriorly;Trunk flexed Gait velocity: Decreased   General Gait Details: Initial attempt at side steps at EOB with RW, pt unable to take step despite multimodal cues, very unsteady; pivotal steps from bed to recliner with HHA and maxA for stability, noted pt's bowel incontinence after this which may have contributed to increased difficulty with sequencing; additional gait trial with RW, pt able to walk ~4' forwards/backwards with modA for stability, much improved sequencing and ability to manage RW with assist   Stairs             Wheelchair Mobility    Modified Rankin (Stroke Patients Only) Modified Rankin (Stroke Patients Only) Pre-Morbid Rankin Score: No symptoms Modified Rankin: Moderately severe disability     Balance Overall balance assessment: Needs assistance Sitting-balance support: Feet supported;No upper extremity supported Sitting balance-Leahy Scale: Fair     Standing balance support: Bilateral upper extremity supported;Single extremity supported;During functional activity Standing balance-Leahy Scale: Poor Standing balance comment: Reliant on UE support and external assist to maintain balance; dependent for posterior hygiene from bowel incontinence                            Cognition Arousal/Alertness:  Awake/alert Behavior During Therapy: WFL for tasks assessed/performed;Flat affect Overall Cognitive Status:  Impaired/Different from baseline Area of Impairment: Following commands;Awareness;Problem solving;Safety/judgement;Attention;Memory                   Current Attention Level: Sustained Memory: Decreased short-term memory Following Commands: Follows one step commands consistently;Follows one step commands with increased time Safety/Judgement: Decreased awareness of safety;Decreased awareness of deficits Awareness: Intellectual;Emergent Problem Solving: Slow processing;Decreased initiation;Difficulty sequencing;Requires verbal cues General Comments: More alert and interactive this session; still with some difficulty getting words out. Pt with apparent motor planning difficults, requiring frequent verbal and tactile cues for completing tasks. Pt fidgeting with mobility; unaware of bowel incontinence. Pt having significant difficulty when initially trying to take steps at EOB, then noted pt to have bowel incontinence, which I suspect was cnotributing to his difficulty focusing on walking task which pt completed better later during session      Exercises General Exercises - Upper Extremity Shoulder Flexion: AROM;10 reps (Ot providing resistance) Shoulder ABduction: AROM;10 reps (OT providing resistance) Elbow Flexion: AROM;10 reps (OT providing resistance) Elbow Extension: AROM;10 reps (OT providing resistance.)    General Comments General comments (skin integrity, edema, etc.): VSS on RA      Pertinent Vitals/Pain Pain Assessment: No/denies pain Pain Intervention(s): Monitored during session    Home Living                      Prior Function            PT Goals (current goals can now be found in the care plan section) Acute Rehab PT Goals Patient Stated Goal: To go home and see his wife. Progress towards PT goals: Progressing toward goals    Frequency     Min 4X/week      PT Plan Current plan remains appropriate    Co-evaluation              AM-PAC PT "6 Clicks" Mobility   Outcome Measure  Help needed turning from your back to your side while in a flat bed without using bedrails?: A Little Help needed moving from lying on your back to sitting on the side of a flat bed without using bedrails?: A Little Help needed moving to and from a bed to a chair (including a wheelchair)?: A Lot Help needed standing up from a chair using your arms (e.g., wheelchair or bedside chair)?: A Lot Help needed to walk in hospital room?: A Lot Help needed climbing 3-5 steps with a railing? : Total 6 Click Score: 13    End of Session Equipment Utilized During Treatment: Gait belt Activity Tolerance: Patient tolerated treatment well Patient left: in chair;with call bell/phone within reach;with chair alarm set Nurse Communication: Mobility status PT Visit Diagnosis: Unsteadiness on feet (R26.81);Other abnormalities of gait and mobility (R26.89);Muscle weakness (generalized) (M62.81);Other symptoms and signs involving the nervous system (R29.898)     Time: 8527-7824 PT Time Calculation (min) (ACUTE ONLY): 28 min  Charges:  $Gait Training: 8-22 mins $Therapeutic Activity: 8-22 mins                     Mabeline Caras, PT, DPT Acute Rehabilitation Services  Pager [phone] Office [place] 11/07/2020, 3:45 PM

## 2020-11-07 NOTE — Progress Notes (Signed)
Inpatient Diabetes Program Recommendations  AACE/ADA: New Consensus Statement on Inpatient Glycemic Control (2015)  Target Ranges:  Prepandial:   less than 140 mg/dL      Peak postprandial:   less than 180 mg/dL (1-2 hours)      Critically ill patients:  140 - 180 mg/dL   Lab Results  Component Value Date   GLUCAP 174 (H) 11/07/2020   HGBA1C 8.9 (H) 11/02/2020    Review of Glycemic Control Results for MAXI, RODAS (MRN [id number]) as of 11/07/2020 09:11  Ref. Range 11/06/2020 08:00 11/06/2020 12:40 11/06/2020 16:24 11/06/2020 21:23 11/07/2020 07:17  Glucose-Capillary Latest Ref Range: 70 - 99 mg/dL 289 (H) 293 (H) 388 (H) 182 (H) 174 (H)   Diabetes history: DM 2 Outpatient Diabetes medications: Lantus 18 units Daily, Metformin 1000 mg bid Current orders for Inpatient glycemic control:  Lantus 30 units Novolog 0-15 units tid  Ensure Enlive bid between meals  Inpatient Diabetes Program Recommendations:    Trends lower today fasting 170's.  - may consider Novolog 4 units tid meal coverage (eating>50% of meals) if postprandials increase.  Thanks,  Tama Headings RN, MSN, BC-ADM Inpatient Diabetes Coordinator Team Pager [phone] (8a-5p)

## 2020-11-08 ENCOUNTER — Encounter (HOSPITAL_COMMUNITY): Payer: Self-pay | Admitting: Physical Medicine and Rehabilitation

## 2020-11-08 ENCOUNTER — Inpatient Hospital Stay (HOSPITAL_COMMUNITY)
Admission: RE | Admit: 2020-11-08 | Discharge: 2020-11-19 | DRG: 092 | Disposition: A | Discharge status: Home Health | Payer: Medicare Other | Source: Intra-hospital | Attending: Physical Medicine and Rehabilitation | Admitting: Physical Medicine and Rehabilitation

## 2020-11-08 ENCOUNTER — Encounter (HOSPITAL_COMMUNITY): Payer: Self-pay | Admitting: Internal Medicine

## 2020-11-08 ENCOUNTER — Inpatient Hospital Stay (HOSPITAL_COMMUNITY): Payer: Medicare Other

## 2020-11-08 ENCOUNTER — Other Ambulatory Visit: Payer: Self-pay

## 2020-11-08 DIAGNOSIS — R260 Ataxic gait: Secondary | ICD-10-CM | POA: Diagnosis present

## 2020-11-08 DIAGNOSIS — R269 Unspecified abnormalities of gait and mobility: Principal | ICD-10-CM | POA: Diagnosis present

## 2020-11-08 DIAGNOSIS — E440 Moderate protein-calorie malnutrition: Secondary | ICD-10-CM | POA: Diagnosis present

## 2020-11-08 DIAGNOSIS — Z96653 Presence of artificial knee joint, bilateral: Secondary | ICD-10-CM | POA: Diagnosis present

## 2020-11-08 DIAGNOSIS — D649 Anemia, unspecified: Secondary | ICD-10-CM | POA: Diagnosis present

## 2020-11-08 DIAGNOSIS — E11649 Type 2 diabetes mellitus with hypoglycemia without coma: Secondary | ICD-10-CM | POA: Diagnosis not present

## 2020-11-08 DIAGNOSIS — N179 Acute kidney failure, unspecified: Secondary | ICD-10-CM | POA: Diagnosis present

## 2020-11-08 DIAGNOSIS — E538 Deficiency of other specified B group vitamins: Secondary | ICD-10-CM

## 2020-11-08 DIAGNOSIS — H9190 Unspecified hearing loss, unspecified ear: Secondary | ICD-10-CM | POA: Diagnosis present

## 2020-11-08 DIAGNOSIS — F05 Delirium due to known physiological condition: Secondary | ICD-10-CM | POA: Diagnosis not present

## 2020-11-08 DIAGNOSIS — R4701 Aphasia: Secondary | ICD-10-CM | POA: Diagnosis present

## 2020-11-08 DIAGNOSIS — M722 Plantar fascial fibromatosis: Secondary | ICD-10-CM | POA: Diagnosis present

## 2020-11-08 DIAGNOSIS — Z8673 Personal history of transient ischemic attack (TIA), and cerebral infarction without residual deficits: Secondary | ICD-10-CM

## 2020-11-08 DIAGNOSIS — R001 Bradycardia, unspecified: Secondary | ICD-10-CM | POA: Diagnosis present

## 2020-11-08 DIAGNOSIS — Z888 Allergy status to other drugs, medicaments and biological substances status: Secondary | ICD-10-CM

## 2020-11-08 DIAGNOSIS — Z974 Presence of external hearing-aid: Secondary | ICD-10-CM

## 2020-11-08 DIAGNOSIS — M1990 Unspecified osteoarthritis, unspecified site: Secondary | ICD-10-CM | POA: Diagnosis present

## 2020-11-08 DIAGNOSIS — E119 Type 2 diabetes mellitus without complications: Secondary | ICD-10-CM

## 2020-11-08 DIAGNOSIS — G4700 Insomnia, unspecified: Secondary | ICD-10-CM | POA: Diagnosis present

## 2020-11-08 DIAGNOSIS — I639 Cerebral infarction, unspecified: Secondary | ICD-10-CM

## 2020-11-08 DIAGNOSIS — I10 Essential (primary) hypertension: Secondary | ICD-10-CM

## 2020-11-08 DIAGNOSIS — E663 Overweight: Secondary | ICD-10-CM | POA: Diagnosis present

## 2020-11-08 DIAGNOSIS — Z6825 Body mass index (BMI) 25.0-25.9, adult: Secondary | ICD-10-CM

## 2020-11-08 DIAGNOSIS — I161 Hypertensive emergency: Secondary | ICD-10-CM | POA: Diagnosis present

## 2020-11-08 DIAGNOSIS — N400 Enlarged prostate without lower urinary tract symptoms: Secondary | ICD-10-CM | POA: Diagnosis present

## 2020-11-08 DIAGNOSIS — R41841 Cognitive communication deficit: Secondary | ICD-10-CM | POA: Diagnosis present

## 2020-11-08 DIAGNOSIS — Z7982 Long term (current) use of aspirin: Secondary | ICD-10-CM

## 2020-11-08 DIAGNOSIS — M7918 Myalgia, other site: Secondary | ICD-10-CM | POA: Diagnosis present

## 2020-11-08 DIAGNOSIS — Z85820 Personal history of malignant melanoma of skin: Secondary | ICD-10-CM

## 2020-11-08 DIAGNOSIS — I674 Hypertensive encephalopathy: Secondary | ICD-10-CM | POA: Diagnosis present

## 2020-11-08 DIAGNOSIS — G9340 Encephalopathy, unspecified: Secondary | ICD-10-CM | POA: Diagnosis not present

## 2020-11-08 DIAGNOSIS — E871 Hypo-osmolality and hyponatremia: Secondary | ICD-10-CM | POA: Diagnosis present

## 2020-11-08 DIAGNOSIS — R350 Frequency of micturition: Secondary | ICD-10-CM | POA: Diagnosis not present

## 2020-11-08 DIAGNOSIS — Z96612 Presence of left artificial shoulder joint: Secondary | ICD-10-CM | POA: Diagnosis present

## 2020-11-08 DIAGNOSIS — Z881 Allergy status to other antibiotic agents status: Secondary | ICD-10-CM

## 2020-11-08 DIAGNOSIS — Z79899 Other long term (current) drug therapy: Secondary | ICD-10-CM

## 2020-11-08 DIAGNOSIS — Z7984 Long term (current) use of oral hypoglycemic drugs: Secondary | ICD-10-CM

## 2020-11-08 DIAGNOSIS — Z794 Long term (current) use of insulin: Secondary | ICD-10-CM

## 2020-11-08 DIAGNOSIS — Z833 Family history of diabetes mellitus: Secondary | ICD-10-CM

## 2020-11-08 LAB — GLUCOSE, CAPILLARY
Glucose-Capillary: 162 mg/dL — ABNORMAL HIGH (ref 70–99)
Glucose-Capillary: 389 mg/dL — ABNORMAL HIGH (ref 70–99)
Glucose-Capillary: 351 mg/dL — ABNORMAL HIGH (ref 70–99)
Glucose-Capillary: 185 mg/dL — ABNORMAL HIGH (ref 70–99)

## 2020-11-08 LAB — BASIC METABOLIC PANEL
Anion gap: 9 (ref 5–15)
BUN: 40 mg/dL — ABNORMAL HIGH (ref 8–23)
CO2: 24 mmol/L (ref 22–32)
Calcium: 8.2 mg/dL — ABNORMAL LOW (ref 8.9–10.3)
Chloride: 99 mmol/L (ref 98–111)
Creatinine, Ser: 1.4 mg/dL — ABNORMAL HIGH (ref 0.61–1.24)
GFR, Estimated: 48 mL/min — ABNORMAL LOW (ref >60)
Glucose, Bld: 200 mg/dL — ABNORMAL HIGH (ref 70–99)
Potassium: 3.9 mmol/L (ref 3.5–5.1)
Sodium: 132 mmol/L — ABNORMAL LOW (ref 135–145)

## 2020-11-08 LAB — CBC
HCT: 33.3 % — ABNORMAL LOW (ref 39.0–52.0)
Hemoglobin: 11.2 g/dL — ABNORMAL LOW (ref 13.0–17.0)
MCH: 30.4 pg (ref 26.0–34.0)
MCHC: 33.6 g/dL (ref 30.0–36.0)
MCV: 90.2 fL (ref 80.0–100.0)
Platelets: 183 10*3/uL (ref 150–400)
RBC: 3.69 MIL/uL — ABNORMAL LOW (ref 4.22–5.81)
RDW: 13.6 % (ref 11.5–15.5)
WBC: 8.8 10*3/uL (ref 4.0–10.5)
nRBC: 0 % (ref 0.0–0.2)

## 2020-11-08 MED ORDER — PROCHLORPERAZINE MALEATE 5 MG PO TABS: 5.0000 mg | ORAL_TABLET | Freq: Four times a day (QID) | ORAL | Status: DC | PRN

## 2020-11-08 MED ORDER — GADOBUTROL 1 MMOL/ML IV SOLN
7.5000 mL | Freq: Once | INTRAVENOUS | Status: AC | PRN
  Administered 2020-11-08: 7.5 mL via INTRAVENOUS

## 2020-11-08 MED ORDER — LIVING WELL WITH DIABETES BOOK
Freq: Once | Status: AC
  Filled 2020-11-08: qty 1

## 2020-11-08 MED ORDER — INSULIN ASPART 100 UNIT/ML ~~LOC~~ SOLN
0.0000 [IU] | Freq: Every day | SUBCUTANEOUS | Status: DC
  Administered 2020-11-09 – 2020-11-10 (×2): 3 [IU] via SUBCUTANEOUS
  Administered 2020-11-12: 5 [IU] via SUBCUTANEOUS
  Administered 2020-11-13: 3 [IU] via SUBCUTANEOUS
  Administered 2020-11-16: 5 [IU] via SUBCUTANEOUS

## 2020-11-08 MED ORDER — HYDRALAZINE HCL 25 MG PO TABS
25.0000 mg | ORAL_TABLET | Freq: Three times a day (TID) | ORAL | Status: DC
  Administered 2020-11-08 – 2020-11-19 (×32): 25 mg via ORAL
  Filled 2020-11-08 (×32): qty 1

## 2020-11-08 MED ORDER — QUETIAPINE FUMARATE 25 MG PO TABS
25.0000 mg | ORAL_TABLET | Freq: Every day | ORAL | Status: DC
  Administered 2020-11-08 – 2020-11-10 (×3): 25 mg via ORAL
  Filled 2020-11-08 (×4): qty 1

## 2020-11-08 MED ORDER — ENSURE ENLIVE PO LIQD: 237.0000 mL | Freq: Two times a day (BID) | ORAL | Status: DC

## 2020-11-08 MED ORDER — QUETIAPINE FUMARATE 25 MG PO TABS: 25.0000 mg | ORAL_TABLET | Freq: Every day | ORAL | 0 refills | Status: DC

## 2020-11-08 MED ORDER — PROCHLORPERAZINE 25 MG RE SUPP: 12.5000 mg | Freq: Four times a day (QID) | RECTAL | Status: DC | PRN

## 2020-11-08 MED ORDER — TRAZODONE HCL 50 MG PO TABS
25.0000 mg | ORAL_TABLET | Freq: Every evening | ORAL | Status: DC | PRN
  Administered 2020-11-10: 50 mg via ORAL
  Filled 2020-11-08 (×2): qty 1

## 2020-11-08 MED ORDER — DIPHENHYDRAMINE HCL 12.5 MG/5ML PO ELIX: 12.5000 mg | ORAL_SOLUTION | Freq: Four times a day (QID) | ORAL | Status: DC | PRN

## 2020-11-08 MED ORDER — LOSARTAN POTASSIUM 50 MG PO TABS: 50.0000 mg | ORAL_TABLET | Freq: Every day | ORAL | Status: DC

## 2020-11-08 MED ORDER — CYANOCOBALAMIN 1000 MCG PO TABS: 1000.0000 ug | ORAL_TABLET | Freq: Every day | ORAL | 0 refills | Status: DC

## 2020-11-08 MED ORDER — ALUM & MAG HYDROXIDE-SIMETH 200-200-20 MG/5ML PO SUSP: 30.0000 mL | ORAL | Status: DC | PRN

## 2020-11-08 MED ORDER — CYANOCOBALAMIN 1000 MCG/ML IJ SOLN
1000.0000 ug | INTRAMUSCULAR | Status: DC
  Administered 2020-11-13: 1000 ug via INTRAMUSCULAR
  Filled 2020-11-08: qty 1

## 2020-11-08 MED ORDER — INSULIN ASPART 100 UNIT/ML ~~LOC~~ SOLN
0.0000 [IU] | Freq: Three times a day (TID) | SUBCUTANEOUS | Status: DC
  Administered 2020-11-09: 3 [IU] via SUBCUTANEOUS
  Administered 2020-11-09: 2 [IU] via SUBCUTANEOUS
  Administered 2020-11-09: 5 [IU] via SUBCUTANEOUS
  Administered 2020-11-10: 1 [IU] via SUBCUTANEOUS
  Administered 2020-11-10: 3 [IU] via SUBCUTANEOUS
  Administered 2020-11-10: 2 [IU] via SUBCUTANEOUS
  Administered 2020-11-11: 9 [IU] via SUBCUTANEOUS
  Administered 2020-11-11: 2 [IU] via SUBCUTANEOUS
  Administered 2020-11-12: 5 [IU] via SUBCUTANEOUS
  Administered 2020-11-12: 7 [IU] via SUBCUTANEOUS
  Administered 2020-11-12: 2 [IU] via SUBCUTANEOUS
  Administered 2020-11-13: 3 [IU] via SUBCUTANEOUS
  Administered 2020-11-13: 1 [IU] via SUBCUTANEOUS
  Administered 2020-11-13 – 2020-11-14 (×2): 3 [IU] via SUBCUTANEOUS
  Administered 2020-11-14: 5 [IU] via SUBCUTANEOUS
  Administered 2020-11-15 (×2): 2 [IU] via SUBCUTANEOUS
  Administered 2020-11-16: 5 [IU] via SUBCUTANEOUS
  Administered 2020-11-17 (×2): 2 [IU] via SUBCUTANEOUS
  Administered 2020-11-18: 1 [IU] via SUBCUTANEOUS
  Administered 2020-11-18: 7 [IU] via SUBCUTANEOUS
  Administered 2020-11-19: 2 [IU] via SUBCUTANEOUS

## 2020-11-08 MED ORDER — FLEET ENEMA 7-19 GM/118ML RE ENEM: 1.0000 | ENEMA | Freq: Once | RECTAL | Status: DC | PRN

## 2020-11-08 MED ORDER — POLYETHYLENE GLYCOL 3350 17 G PO PACK: 17.0000 g | PACK | Freq: Every day | ORAL | Status: DC | PRN

## 2020-11-08 MED ORDER — HYDRALAZINE HCL 25 MG PO TABS: 25.0000 mg | ORAL_TABLET | Freq: Three times a day (TID) | ORAL | Status: DC

## 2020-11-08 MED ORDER — HYDRALAZINE HCL 25 MG PO TABS
25.0000 mg | ORAL_TABLET | Freq: Three times a day (TID) | ORAL | Status: DC
  Administered 2020-11-08: 25 mg via ORAL
  Filled 2020-11-08: qty 1

## 2020-11-08 MED ORDER — PROCHLORPERAZINE EDISYLATE 10 MG/2ML IJ SOLN: 5.0000 mg | Freq: Four times a day (QID) | INTRAMUSCULAR | Status: DC | PRN

## 2020-11-08 MED ORDER — GUAIFENESIN-DM 100-10 MG/5ML PO SYRP
5.0000 mL | ORAL_SOLUTION | Freq: Four times a day (QID) | ORAL | Status: DC | PRN
  Filled 2020-11-08: qty 10

## 2020-11-08 MED ORDER — LOSARTAN POTASSIUM 50 MG PO TABS
50.0000 mg | ORAL_TABLET | Freq: Every day | ORAL | Status: DC
  Administered 2020-11-09 – 2020-11-11 (×3): 50 mg via ORAL
  Filled 2020-11-08 (×3): qty 1

## 2020-11-08 MED ORDER — ACETAMINOPHEN 325 MG PO TABS
325.0000 mg | ORAL_TABLET | ORAL | Status: DC | PRN
  Administered 2020-11-09 – 2020-11-10 (×5): 650 mg via ORAL
  Filled 2020-11-08 (×3): qty 2

## 2020-11-08 MED ORDER — CYANOCOBALAMIN 1000 MCG/ML IJ SOLN: 1000.0000 ug | INTRAMUSCULAR | 0 refills | Status: DC

## 2020-11-08 MED ORDER — LOSARTAN POTASSIUM 50 MG PO TABS
50.0000 mg | ORAL_TABLET | Freq: Every day | ORAL | Status: DC
  Administered 2020-11-08: 50 mg via ORAL
  Filled 2020-11-08: qty 1

## 2020-11-08 MED ORDER — BLOOD PRESSURE CONTROL BOOK
Freq: Once | Status: AC
  Filled 2020-11-08: qty 1

## 2020-11-08 MED ORDER — BISACODYL 10 MG RE SUPP: 10.0000 mg | Freq: Every day | RECTAL | Status: DC | PRN

## 2020-11-08 MED ORDER — LACTATED RINGERS IV BOLUS: 500.0000 mL | Freq: Once | INTRAVENOUS | Status: DC

## 2020-11-08 MED ORDER — ENOXAPARIN SODIUM 30 MG/0.3ML ~~LOC~~ SOLN
30.0000 mg | Freq: Every day | SUBCUTANEOUS | Status: DC
  Administered 2020-11-09: 30 mg via SUBCUTANEOUS
  Filled 2020-11-08: qty 0.3

## 2020-11-08 MED ORDER — INSULIN GLARGINE 100 UNIT/ML ~~LOC~~ SOLN
30.0000 [IU] | Freq: Every morning | SUBCUTANEOUS | Status: DC
  Administered 2020-11-09 – 2020-11-10 (×2): 30 [IU] via SUBCUTANEOUS
  Filled 2020-11-08 (×4): qty 0.3

## 2020-11-08 MED ORDER — VITAMIN B-12 1000 MCG PO TABS
1000.0000 ug | ORAL_TABLET | Freq: Every day | ORAL | Status: DC
  Administered 2020-11-09 – 2020-11-19 (×11): 1000 ug via ORAL
  Filled 2020-11-08 (×11): qty 1

## 2020-11-08 NOTE — Progress Notes (Signed)
Inpatient Rehab Admissions Coordinator:   I do not have a CIR bed for this patient today. His insurance has requested peer to peer by 9am tomorrow and I have reached out to Dr. Bridgett Larsson, who agreed to complete it. I will continue to follow for potential admit pending insurance auth and bed availabiity.   Clemens Catholic, [place], [place] Admissions Coordinator  [phone] ([place]) [phone] (office)

## 2020-11-08 NOTE — Progress Notes (Addendum)
   Subjective:   Patient states he is doing better this morning and is no longer experiencing neck pain. Oriented to month and location.   [place], patient's son is at bedside. He finds it difficult to tell if there is improvement in the last 2 days given lack of movement. However, mental status seems back to baseline, other than being more quiet than usual.   Objective:  Vital signs in last 24 hours: Vitals:   11/07/20 2051 11/07/20 2329 11/08/20 0433 11/08/20 0542  BP: (!) 145/53 (!) 153/70 137/78   Pulse:  89 84   Resp:  19 19   Temp:  98.6 F (37 C) 99.2 F (37.3 C)   TempSrc:  Oral Oral   SpO2:  94% 96%   Weight:    75.8 kg  Height:       Physical Exam Constitutional: no acute distress Head: atraumatic ENT: external ears normal Eyes: EOMI, pupils are asymmetric due to prior injury and surgeries Cardiovascular: regular rate and rhythm, normal heart sounds Pulmonary: effort normal, lungs clear to ascultation bilaterally Abdominal: Flat Skin: warm and dry Neurological: alert, disoriented to time but states that it is hard to know what day it is when he is lying in his bed all day, oriented to place and person, follows commands, 5/5 throughout Psychiatric: normal mood and affect  Assessment/Plan: Caleb Dougherty is a 87 y.o. male with hx of HTN, DM2, HLD presenting with acute encephalopathy and R-sided weakness, also found to have fever and blood pressure 212/85 on arrival.  Mental status has improved, etiology not totally clear but hypertensive emergency seems likely.  Active Problems:   Altered mental status   Malnutrition of moderate degree   Acute encephalopathy   Fever   Hypertensive crisis  Hypertensive emergency MRI without acute stroke. No evidence of bacterial or viral meningitis at this time with negative blood and CSF cultures, negative CSF HSV PCR. Hypertensive emergency is high on the differential. Overall, patient continues to improve symptomatically. BP  now controlled with PO hydralazine. -Neurology following, appreciate recommendations -Follow-up CSF beta-isoform (CJD study), autoimmune encephalitis panel -restart losartan at 50mg  (home dose was 100mg ) -Decrease Hydralazine to 25mg  TID -Consult for CIR  Neck pain Resolved. Likely MSK. -Heating pad as needed -Monitor for changes mental status and signs of infection  AKI Improving. Having post-ATN diuresis. Likely due to factorial from acyclovir, rapid blood pressure drop, and pre-renal. Renal ultrasound negative. Bladder scan benign. -restart losartan as above -Daily BMP, avoid nephrotoxins  Vitamin B12 deficiency B12 level of 189. - weekly B12 1000 IM - oral B12 1000 daily - f/u methylmalonic acid  Hospital delirium Much improved.  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 -Recommend outpatient follow-up -Subpleural consolidation, recommend follow-up chest CT in 3-6 months  Insulin dependent type II diabetes A1c of 8.9.  Home Lantus 18 units daily. -Lantus to 30 units daily -Continue sliding scale insulin  Diet:  Carb modified IVF:  none VTE:  SCD Prior to Admission Living Arrangement:  home Anticipated Discharge Location:  CIR Barriers to Discharge:  Placement determination Dispo: medically stable for discharge, pending placement  Dr. Edison Simon Internal Medicine PGY-1 Pager: [phone] After 5pm on weekdays and 1pm on weekends: On Call pager [phone]  11/08/2020, 6:40 AM
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
## 2020-11-08 NOTE — Plan of Care (Signed)

## 2020-11-08 NOTE — Progress Notes (Signed)
Physical Therapy Treatment Patient Details Name: Caleb Dougherty MRN: [id number] DOB: Jan 17, 1929 Today's Date: 11/08/2020    History of Present Illness Pt is an 87 y.o. male admitted 11/01/20 as code stroke with L-side weakness, facial droop, AMS; LKW time at 2100 on 4/4. Head CT unremarkable. CXR with scattered bilateral pulmonary nodules, nonspecific. EEG 4/6 suggestive of moderate encephalopathy; no seizure activity. S/p lumbar puncture 4/7. MRI 4/12 negative for acute abnormality; small chronic infarct posterior R lentiform. PMH includes HOH, HTN, DM2.   PT Comments    Pt progressing with mobility and cognition. Today's session focused on transfer and gait training; pt very unstable, requiring mod-maxA to maintain balance with bouts of short ambulation distances. Pt's movement initiation improving, but still demonstrates with difficulty motor planning (?). Pt pleasant and motivated to participate despite fatigue. CIR MD and AC present at end of session to inform pt of admittance to CIR later today. If to remain admitted, will continue to follow acutely.   Follow Up Recommendations  CIR;Supervision/Assistance - 24 hour     Equipment Recommendations  Other (comment) (defer)    Recommendations for Other Services       Precautions / Restrictions Precautions Precautions: Fall;Other (comment) Precaution Comments: Bladder/bowel incontinence Restrictions Weight Bearing Restrictions: No    Mobility  Bed Mobility Overal bed mobility: Needs Assistance Bed Mobility: Supine to Sit     Supine to sit: Supervision;HOB elevated     General bed mobility comments: Pt told to sit up and hand motioned provided for him to come towards EOB, pt repositioning self in bed but not sitting up, did this 2x; pt then asked to sit up because we are going to walk, pt stated, "Oh!" then able to sit EOB; increased time and effort    Transfers Overall transfer level: Needs assistance Equipment used: Rolling  walker (2 wheeled);1 person hand held assist Transfers: Sit to/from Stand Sit to Stand: Mod assist         General transfer comment: Pt performed multiple sit<>stands from EOB and recliner with and without RW; improved ability to initiate task, still requiring verbal cues for hand placement and technique, pt continues to try despite falling backwards, finally provided HHA for UE support and able to stand with modA; trials with RW, pt pulling on RW despite it falling over on him multiple times, eventually requiring modA to elevate trunk and manage RW  Ambulation/Gait Ambulation/Gait assistance: Mod assist;Min assist;Max assist Gait Distance (Feet): 24 Feet (+24+14) Assistive device: Rolling walker (2 wheeled);1 person hand held assist Gait Pattern/deviations: Step-through pattern;Decreased stride length;Staggering left;Staggering right;Trunk flexed Gait velocity: Decreased   General Gait Details: Multiple gait trials with and without RW; initial ambulation short distance with RW, pt with improved sequencing, still requiring assist for stability and RW management, multiple verbal cues for direction (difficulty with multi-step commands? attention?); 2x additional gait trials without DME, pt reliant on HHA and frequent mod-maxA to maintain upright with multiple LOB   Stairs             Wheelchair Mobility    Modified Rankin (Stroke Patients Only) Modified Rankin (Stroke Patients Only) Pre-Morbid Rankin Score: No symptoms Modified Rankin: Moderately severe disability     Balance Overall balance assessment: Needs assistance Sitting-balance support: Feet supported;No upper extremity supported Sitting balance-Leahy Scale: Fair     Standing balance support: Bilateral upper extremity supported;Single extremity supported;During functional activity   Standing balance comment: Reliant on UE support and external assist to maintain balance; dependent for posterior  hygiene from bowel  incontinence                            Cognition Arousal/Alertness: Awake/alert Behavior During Therapy: WFL for tasks assessed/performed;Flat affect Overall Cognitive Status: Impaired/Different from baseline Area of Impairment: Following commands;Awareness;Problem solving;Safety/judgement;Attention                   Current Attention Level: Sustained;Selective   Following Commands: Follows one step commands consistently;Follows one step commands with increased time Safety/Judgement: Decreased awareness of safety;Decreased awareness of deficits Awareness: Intellectual;Emergent Problem Solving: Slow processing;Decreased initiation;Difficulty sequencing;Requires verbal cues General Comments: More alert and interactive this session; still with some difficulty getting words out, speech and clarity improving. Pt with apparent motor planning difficultiess, requiring verbal cues to complete task, although improved from prior session. unaware of bowel incontinence; informed of incontinence and cleaned up, then pt later telling MD that last BM was yesterday.      Exercises      General Comments General comments (skin integrity, edema, etc.): MD from CIR present at end of session to inform pt of admission to CIR later this evening      Pertinent Vitals/Pain Pain Assessment: No/denies pain Pain Intervention(s): Monitored during session    Home Living                      Prior Function            PT Goals (current goals can now be found in the care plan section) Progress towards PT goals: Progressing toward goals    Frequency    Min 4X/week      PT Plan Current plan remains appropriate    Co-evaluation              AM-PAC PT "6 Clicks" Mobility   Outcome Measure  Help needed turning from your back to your side while in a flat bed without using bedrails?: A Little Help needed moving from lying on your back to sitting on the side of a flat  bed without using bedrails?: A Little Help needed moving to and from a bed to a chair (including a wheelchair)?: A Lot Help needed standing up from a chair using your arms (e.g., wheelchair or bedside chair)?: A Lot Help needed to walk in hospital room?: A Lot Help needed climbing 3-5 steps with a railing? : Total 6 Click Score: 13    End of Session Equipment Utilized During Treatment: Gait belt Activity Tolerance: Patient tolerated treatment well Patient left: in bed;with call bell/phone within reach;with bed alarm set Nurse Communication: Mobility status PT Visit Diagnosis: Unsteadiness on feet (R26.81);Other abnormalities of gait and mobility (R26.89);Muscle weakness (generalized) (M62.81);Other symptoms and signs involving the nervous system (R29.898)     Time: 6962-9528 PT Time Calculation (min) (ACUTE ONLY): 24 min  Charges:  $Gait Training: 8-22 mins $Therapeutic Activity: 8-22 mins                     Mabeline Caras, PT, DPT Acute Rehabilitation Services  Pager [phone] Office [place] 11/08/2020, 5:27 PM

## 2020-11-08 NOTE — Progress Notes (Incomplete)
Inpatient Rehab Admissions Coordinator:   I do not have a CIR bed for this pt. Today.Pt. . I will continue to follow for potential admit pending bed availability and insurance auth.

## 2020-11-08 NOTE — Progress Notes (Signed)
Nutrition Follow-up  DOCUMENTATION CODES:   Non-severe (moderate) malnutrition in context of chronic illness  INTERVENTION:  Continue Ensure Enlive po BID, each supplement provides 350 kcal and 20 grams of protein.  Encourage adequate PO intake.   NUTRITION DIAGNOSIS:   Moderate Malnutrition related to chronic illness (arthritis, cancer) as evidenced by mild fat depletion,mild muscle depletion; ongoing  GOAL:   Patient will meet greater than or equal to 90% of their needs; progressing  MONITOR:   PO intake,Supplement acceptance,Skin,Weight trends,Labs,I & O's  REASON FOR ASSESSMENT:   Malnutrition Screening Tool    ASSESSMENT:   87 year-old male with medical history of HTN, arthritis, cancer, and type 2 DM. he presented to the ED due to AMS, weakness, and aphasia.  Per MD, awaiting LP results. Meal completion has been 20-100%. Mentation back at baseline. Pt currently has Ensure ordered and has been consuming them. RD to continue with current orders to aid in caloric and protein needs. Plans for CIR upon discharge.   Labs and medications reviewed.   Diet Order:   Diet Order            Diet - low sodium heart healthy           Diet Carb Modified Fluid consistency: Thin; Room service appropriate? Yes with Assist  Diet effective now                 EDUCATION NEEDS:   Not appropriate for education at this time  Skin:  Skin Assessment: Reviewed RN Assessment  Last BM:  4/12  Height:   Ht Readings from Last 1 Encounters:  11/01/20 5\' 8"  (1.727 m)    Weight:   Wt Readings from Last 1 Encounters:  11/08/20 75.8 kg    BMI:  Body mass index is 25.42 kg/m.  Estimated Nutritional Needs:   Kcal:  1700-1900 kcal  Protein:  80-90 grams  Fluid:  >/= 1.8 L/day  Corrin Parker, MS, RD, LDN RD pager number/after hours weekend pager number on Amion.

## 2020-11-08 NOTE — Progress Notes (Signed)
Pt arrived to unit, pt alert, oriented to rehab.

## 2020-11-08 NOTE — Progress Notes (Signed)
Inpatient Diabetes Program Recommendations  AACE/ADA: New Consensus Statement on Inpatient Glycemic Control (2015)  Target Ranges:  Prepandial:   less than 140 mg/dL      Peak postprandial:   less than 180 mg/dL (1-2 hours)      Critically ill patients:  140 - 180 mg/dL   Lab Results  Component Value Date   GLUCAP 162 (H) 11/08/2020   HGBA1C 8.9 (H) 11/02/2020    Review of Glycemic Control Results for JAMA, KRICHBAUM (MRN [id number]) as of 11/08/2020 09:44  Ref. Range 11/07/2020 07:17 11/07/2020 12:03 11/07/2020 17:12 11/07/2020 21:30 11/08/2020 07:44  Glucose-Capillary Latest Ref Range: 70 - 99 mg/dL 174 (H) 367 (H) 324 (H) 398 (H) 162 (H)    Diabetes history: DM 2 Outpatient Diabetes medications: Lantus 18 units Daily, Metformin 1000 mg bid Current orders for Inpatient glycemic control:  Lantus 30 units Novolog 0-15 units tid  Ensure Enlive bid between meals  Inpatient Diabetes Program Recommendations:    - may consider Novolog 4 units tid meal coverage (eating>50% of meals) if postprandials increase.  Thanks,  Tama Headings RN, MSN, BC-ADM Inpatient Diabetes Coordinator Team Pager [phone] (8a-5p)

## 2020-11-08 NOTE — Progress Notes (Signed)
Inpatient Rehabilitation  Patient information reviewed and entered into eRehab system by Domingos Riggi M. Bryan Goin, M.A., CCC/SLP, PPS Coordinator.  Information including medical coding, functional ability and quality indicators will be reviewed and updated through discharge.    

## 2020-11-08 NOTE — Progress Notes (Signed)
Inpatient Rehabilitation Medication Review by a Pharmacist  A complete drug regimen review was completed for this patient to identify any potential clinically significant medication issues.  Clinically significant medication issues were identified:  Yes   Type of Medication Issue Identified Description of Issue Urgent (address now) Non-Urgent (address on AM team rounds) Plan Plan Accepted by Provider? (Yes / No / Pending AM Rounds)  Drug Interaction(s) (clinically significant)       Duplicate Therapy       Allergy       No Medication Administration End Date       Incorrect Dose       Additional Drug Therapy Needed       Other  Nonformulary Ensure Max was ordered Pt was on ENsure Enlive per Dietician note during inpatient stay Non-urgent Substituted with Ensure [facility]     Name of provider notified for urgent issues identified:  Algis Liming Provider Method of Notification:  Epic chat   For non-urgent medication issues to be resolved on team rounds tomorrow morning a [facility] Secure Chat Handoff was sent to:    Pharmacist comments:  Changed to formulary item EnSure Enlive per Dietician note indicated  Time spent performing this drug regimen review (minutes):  15    Caleb Dougherty 11/08/2020 6:30 PM

## 2020-11-08 NOTE — Progress Notes (Signed)
Inpatient Rehab Admissions Coordinator:    I received insurance auth and have a bed for Pt. This evening. I will work on getting him transferred to [place]. RN may call report to [phone].  Clemens Catholic, [place], [place] Admissions Coordinator  [phone] ([place]) [phone] (office)

## 2020-11-08 NOTE — Progress Notes (Signed)
PMR Admission Coordinator Pre-Admission Assessment  Patient: Caleb Dougherty is an 87 y.o., male MRN: [id number] DOB: 15-Jul-1929 Height: '5\' 8"'  (172.7 cm) Weight: 74.4 kg                                                                                                                                                  Insurance Information HMO: yes    PPO:      PCP:      IPA:      80/20:      OTHER:  PRIMARY: [employer] Medicare      Policy#: [id number]      Subscriber: Pt. CM Name:opened online       Phone#: [email or web address]    Fax#: [phone] Pre-Cert#: [id number]      Employer: Benefits:  Phone #:   Name:  Colletta [place] with Bernad[facility] Bell called to give authorization on 4/12 for 7 day admission beginning 4/12. Clinical updates are due 7 days from admission, to fax # [phone]. Phone number Is [phone] Eff Date: 07/30/2020 - present Deductible: $0 (does not have deductible) OOP Max: $3,600 ($0 met) CIR: $295/day co-pay for days 1-5, $0/day co-pay for days 6+ SNF: $0.00 Copayment per day for days 1-20; $188.00 Copayment per day for days 21-40; $0.00 Copayment per day for days 41-100 for Medicare-covered care/maximum 100 days/benefit period Outpatient: $20/visit co-pay Home Health:  100% coverage, 0% co-insurance; limited by medical necessity DME: 80% coverage; 20% co-insurance  Providers: In network SECONDARY: none       Policy#: none      Phone#: none  The "Data Collection Information Summary" for patients in Inpatient Rehabilitation Facilities with attached "Privacy Act [place] Records" was provided and verbally reviewed with: Family  Emergency Contact Information         Contact Information    Name Relation Home Work [place] Son   [phone]   Caleb Dougherty, Caleb Dougherty [phone]  [phone]   Caleb Dougherty, Caleb Dougherty [phone]  [phone]     Current Medical History  Patient Admitting Diagnosis: AMS History of Present Illness: Pt is an 87  y.o. male admitted 11/01/20 as code stroke with L-side weakness, facial droop, AMS; LKW time at 2100 on 4/4. Head CT unremarkable. Awaiting MRI. CXR with scattered bilateral pulmonary nodules, nonspecific. EEG 4/6 suggestive of moderate encephalopathy; no seizure activity. S/p lumbar puncture 4/7. PMH includes HOH, HTN, DM2.  Complete NIHSS TOTAL: 7 Glasgow Coma Scale Score: 14  Past Medical History      Past Medical History:  Diagnosis Date  . Arthritis    osteoarthritis-knees and feet  . BPH (benign prostatic hypertrophy)   . Cancer [facility])    melanoma(back)-no futher issues. Skin cancers face "frozen"  . Cataract    02-12-14 left- ready for surgery, but only has had right done  . Diabetes mellitus without complication ([place])  diabetes x4 yrs-oral med and Lantus used  . HOH (hard of hearing)    wears bilateral hearing aids  . Hypertension   . Impaired hearing    hard of hearing -no hearing aids  . Urgency of urination   . Wears dentures    full lower  . Wears glasses    reading    Family History  family history is not on file.  Prior Rehab/Hospitalizations:  Has the patient had prior rehab or hospitalizations prior to admission? Yes  Has the patient had major surgery during 100 days prior to admission? No  Current Medications   Current Facility-Administered Medications:  .   stroke: mapping our early stages of recovery book, , Does not apply, Once, [place], Carley D, DO .  acetaminophen (TYLENOL) tablet 650 mg, 650 mg, Oral, Q4H PRN **OR** acetaminophen (TYLENOL) 160 MG/5ML solution 650 mg, 650 mg, Per Tube, Q4H PRN **OR** acetaminophen (TYLENOL) suppository 650 mg, 650 mg, Rectal, Q4H PRN, [place], Carley D, DO, 650 mg at 11/01/20 2253 .  cyanocobalamin ((VITAMIN B-12)) injection 1,000 mcg, 1,000 mcg, Intramuscular, Weekly, Andrew Au, MD .  enoxaparin (LOVENOX) injection 30 mg, 30 mg, Subcutaneous, Daily, Andrew Au, MD, 30 mg at  11/06/20 8466 .  feeding supplement (ENSURE ENLIVE / ENSURE PLUS) liquid 237 mL, 237 mL, Oral, BID BM, Velna Ochs, MD, 237 mL at 11/06/20 0826 .  hydrALAZINE (APRESOLINE) tablet 50 mg, 50 mg, Oral, Q8H, Sanjuan Dame, MD, 50 mg at 11/06/20 0615 .  insulin aspart (novoLOG) injection 0-15 Units, 0-15 Units, Subcutaneous, TID WC, Jose Persia, MD, 8 Units at 11/06/20 [id number] .  insulin glargine (LANTUS) injection 30 Units, 30 Units, Subcutaneous, q morning, Jose Persia, MD, 30 Units at 11/06/20 0827 .  lactated ringers bolus 500 mL, 500 mL, Intravenous, Once, Bridgett Larsson, Mauri Reading, MD .  lidocaine (PF) (XYLOCAINE) 1 % injection 5 mL, 5 mL, Infiltration, Once, Amie Portland, MD .  QUEtiapine (SEROQUEL) tablet 25 mg, 25 mg, Oral, QHS, Andrew Au, MD, 25 mg at 11/05/20 2146 .  [START ON 11/07/2020] vitamin B-12 (CYANOCOBALAMIN) tablet 1,000 mcg, 1,000 mcg, Oral, Daily, Bridgett Larsson Mauri Reading, MD  Patients Current Diet:     Diet Order                  Diet Carb Modified Fluid consistency: Thin; Room service appropriate? Yes with Assist  Diet effective now                  Precautions / Restrictions Precautions Precautions: Fall Precaution Comments: aphasia Restrictions Weight Bearing Restrictions: No   Has the patient had 2 or more falls or a fall with injury in the past year?Yes  Prior Activity Level Community (5-7x/wk): Pt. was active in the community PTA  Prior Functional Level Prior Function Level of Independence: Independent with assistive device(s) Comments: pt ambulates in the home without device, utilizies cane in community on uneven surfaces, driving, likes to do woodwork  Self Care: Did the patient need help bathing, dressing, using the toilet or eating?  Independent  Indoor Mobility: Did the patient need assistance with walking from room to room (with or without device)? Independent  Stairs: Did the patient need assistance with internal or external  stairs (with or without device)? Independent  Functional Cognition: Did the patient need help planning regular tasks such as shopping or remembering to take medications? Needed some help  Home Assistive Devices / [place] Devices/Equipment: Gilford Rile (specify type) (front, didn't  need until 2 weeks ago) Home Equipment: Cane - single point,Wheelchair - manual,Shower seat - built in,Grab bars - toilet,Grab bars - tub/shower,Hand held shower head  Prior Device Use: Indicate devices/aids used by the patient prior to current illness, exacerbation or injury? None of the above  Current Functional Level Cognition  Overall Cognitive Status: Impaired/Different from baseline Current Attention Level: Sustained Orientation Level: Oriented to person,Oriented to place,Oriented to time Following Commands: Follows one step commands inconsistently,Follows one step commands with increased time Safety/Judgement: Decreased awareness of safety,Decreased awareness of deficits General Comments: Much more alert this session, speaking more clearly overall. Still with mumbling speech at times, requires increased time to get words/thoughts out; pt able to voice frustration at end of session regarding current situation ("I was ok... I only had diabetes"). Easily distracted by lines requiring frequent redirection    Extremity Assessment (includes Sensation/Coordination)  Upper Extremity Assessment: RUE deficits/detail,LUE deficits/detail,Generalized weakness RUE Deficits / Details: Pt has difficulties grasping and motor planning fine motor movements. Pt can complete gross motor grasps with decreased strength. RUE Sensation: WNL RUE Coordination: decreased fine motor,decreased gross motor LUE Deficits / Details: Pt has difficulties grasping and motor planning fine motor movements. Pt can complete gross motor grasps with decreased strength. LUE Sensation: WNL LUE Coordination: decreased fine  motor,decreased gross motor  Lower Extremity Assessment: Defer to PT evaluation RLE Coordination: decreased gross motor LLE Coordination: decreased gross motor    ADLs  Overall ADL's : Needs assistance/impaired Eating/Feeding: Set up,Sitting Eating/Feeding Details (indicate cue type and reason): improvements with bringing cup to mouth.  initial placement then able to complete on his own and also place on tray table in between sips Grooming: Wash/dry face,Bed level,Minimal assistance Grooming Details (indicate cue type and reason): cues for thoroughness Upper Body Dressing : Minimal assistance,Sitting Upper Body Dressing Details (indicate cue type and reason): Pt began attempting to doff gown by pulling it, when OT assisted donning gown, pt required tactile cues with where to put his hands through the arms of the gown. Lower Body Dressing: Maximal assistance Lower Body Dressing Details (indicate cue type and reason): Pt unable to reach feet to put socks on or initially place underwear on. Toilet Transfer: Moderate assistance,Stand-pivot Toilet Transfer Details (indicate cue type and reason): simulated during transfer from eob to recliner Functional mobility during ADLs: Moderate assistance General ADL Comments: able to complete bed mobility, and transfer to recliner.  grooming task and some self feeding  (drinking from cup lid/straw)    Mobility  Overal bed mobility: Needs Assistance Bed Mobility: Supine to Sit Supine to sit: Mod assist,HOB elevated Sit to supine: Max assist General bed mobility comments: received sitting in recliner    Transfers  Overall transfer level: Needs assistance Equipment used: Rolling walker (2 wheeled) Transfers: Sit to/from Stand Sit to Stand: Mod assist,Max assist Stand pivot transfers: Min assist,Mod assist General transfer comment: Pt requiring verbal/tactile cues for hand placement with preparation to stand, pt moving BUEs frequently from RW to  recliner and back, then attempting to hold lines; multiple sit<>stands from recliner requiring consistent modA for trunk elevation and to stabilize walker; pt bracing BLEs against recliner when transitioning UE support to RW; poor eccentric control to sit requiring mod-maxA for safe lowering    Ambulation / Gait / Stairs / Wheelchair Mobility  Ambulation/Gait Ambulation/Gait assistance: Mod assist,Max assist,+2 safety/equipment Gait Distance (Feet): 26 Feet (+12) Assistive device: Rolling walker (2 wheeled) Gait Pattern/deviations: Step-through pattern,Decreased stride length,Staggering right,Staggering left,Trunk flexed,Leaning posteriorly General Gait Details:  Slow, very unsteady gait with RW and consistent modA for stability, intermittent LOB in all directions requiring maxA to prevent fall; pt starting to veer outside of RW with LOB requiring cues to take seated rest break; pt taking hands off RW to point at RLE and state it is weak; poor safety awareness; +2 assist for chair follow/safety Gait velocity: Decreased Gait velocity interpretation: <1.31 ft/sec, indicative of household ambulator    Posture / Balance Dynamic Sitting Balance Sitting balance - Comments: Reliant on UE support to maitnain sitting balance at edge of recliner Balance Overall balance assessment: Needs assistance Sitting-balance support: Feet supported,Bilateral upper extremity supported Sitting balance-Leahy Scale: Poor Sitting balance - Comments: Reliant on UE support to maitnain sitting balance at edge of recliner Postural control: Left lateral lean,Posterior lean Standing balance support: Single extremity supported,During functional activity,Bilateral upper extremity supported Standing balance-Leahy Scale: Poor Standing balance comment: Reliant on UE support and external assist to maintain balance    Special needs/care consideration Skin Abrasion and Ecchymosis to BLEs and BUEs     Previous Home  Environment (from acute therapy documentation) Living Arrangements: Spouse/significant other Available Help at Discharge: Family,Available PRN/intermittently Type of Home: House Home Layout: One level Home Access: Ramped entrance Bathroom Shower/Tub: Multimedia programmer: Standard Bathroom Accessibility: Yes How Accessible: Accessible via walker Home Care Services: No Additional Comments: Pt takes care of wife with dementia  Discharge Living Setting Plans for Discharge Living Setting: Patient's home Type of Home at Discharge: House Discharge Home Layout: One level Discharge Home Access: [place] entrance Discharge Bathroom Shower/Tub: Walk-in shower Discharge Bathroom Toilet: Standard Discharge Bathroom Accessibility: Yes How Accessible: Accessible via walker Does the patient have any problems obtaining your medications?: No  Social/Family/Support Systems Patient Roles: Other (Comment) Contact Information: [phone] Anticipated Caregiver: Babak Lucus (son) Anticipated Caregiver's Contact Information: [phone] Caregiver Availability: 24/7 Discharge Plan Discussed with Primary Caregiver: Yes Is Caregiver In Agreement with Plan?: Yes   Goals Patient/Family Goal for Rehab: PT/OT/SLP min A Expected length of stay: 18-21 days Pt/Family Agrees to Admission and willing to participate: Yes Program Orientation Provided & Reviewed with Pt/Caregiver Including Roles  & Responsibilities: Yes   Decrease burden of Care through IP rehab admission: Specialzed equipment needs, Decrease number of caregivers, Bowel and bladder program and Patient/family education   Possible need for SNF placement upon discharge: not anticipated   Patient Condition: I have reviewed medical records from [facility] , spoken with CSW, and patient, son and daughter. I met with patient at the bedside for inpatient rehabilitation assessment.  Patient will benefit from  ongoing PT and OT, can actively participate in 3 hours of therapy a day 5 days of the week, and can make measurable gains during the admission.  Patient will also benefit from the coordinated team approach during an Inpatient Acute Rehabilitation admission.  The patient will receive intensive therapy as well as Rehabilitation physician, nursing, social worker, and care management interventions.  Due to bladder management, bowel management, safety, skin/wound care, disease management, medication administration, pain management and patient education the patient requires 24 hour a day rehabilitation nursing.  The patient is currently min A-Max A with mobility and basic ADLs.  Discharge setting and therapy post discharge at home with home health is anticipated.  Patient has agreed to participate in the Acute Inpatient Rehabilitation Program and will admit today.  Preadmission Screen Completed By:  Genella Mech, CCC-SLP, 11/06/2020 11:47 AM ______________________________________________________________________   Discussed status with Dr. Dagoberto Ligas on 11/08/20  at 1600 and received approval for admission today.  Admission Coordinator:  Genella Mech, time 11/08/20 Sudie Grumbling 1600
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
## 2020-11-09 LAB — CBC WITH DIFFERENTIAL/PLATELET
Abs Immature Granulocytes: 0.02 10*3/uL (ref 0.00–0.07)
Basophils Absolute: 0.1 10*3/uL (ref 0.0–0.1)
Basophils Relative: 1 %
Eosinophils Absolute: 0.3 10*3/uL (ref 0.0–0.5)
Eosinophils Relative: 3 %
HCT: 33.2 % — ABNORMAL LOW (ref 39.0–52.0)
Hemoglobin: 10.8 g/dL — ABNORMAL LOW (ref 13.0–17.0)
Immature Granulocytes: 0 %
Lymphocytes Relative: 18 %
Lymphs Abs: 1.4 10*3/uL (ref 0.7–4.0)
MCH: 29.8 pg (ref 26.0–34.0)
MCHC: 32.5 g/dL (ref 30.0–36.0)
MCV: 91.5 fL (ref 80.0–100.0)
Monocytes Absolute: 1 10*3/uL (ref 0.1–1.0)
Monocytes Relative: 13 %
Neutro Abs: 5.1 10*3/uL (ref 1.7–7.7)
Neutrophils Relative %: 65 %
Platelets: 209 10*3/uL (ref 150–400)
RBC: 3.63 MIL/uL — ABNORMAL LOW (ref 4.22–5.81)
RDW: 13.8 % (ref 11.5–15.5)
WBC: 7.9 10*3/uL (ref 4.0–10.5)
nRBC: 0 % (ref 0.0–0.2)

## 2020-11-09 LAB — COMPREHENSIVE METABOLIC PANEL
ALT: 20 U/L (ref 0–44)
AST: 24 U/L (ref 15–41)
Albumin: 2.3 g/dL — ABNORMAL LOW (ref 3.5–5.0)
Alkaline Phosphatase: 25 U/L — ABNORMAL LOW (ref 38–126)
Anion gap: 7 (ref 5–15)
BUN: 37 mg/dL — ABNORMAL HIGH (ref 8–23)
CO2: 26 mmol/L (ref 22–32)
Calcium: 8.5 mg/dL — ABNORMAL LOW (ref 8.9–10.3)
Chloride: 102 mmol/L (ref 98–111)
Creatinine, Ser: 1.37 mg/dL — ABNORMAL HIGH (ref 0.61–1.24)
GFR, Estimated: 49 mL/min — ABNORMAL LOW (ref >60)
Glucose, Bld: 153 mg/dL — ABNORMAL HIGH (ref 70–99)
Potassium: 4.1 mmol/L (ref 3.5–5.1)
Sodium: 135 mmol/L (ref 135–145)
Total Bilirubin: 1.1 mg/dL (ref 0.3–1.2)
Total Protein: 5 g/dL — ABNORMAL LOW (ref 6.5–8.1)

## 2020-11-09 LAB — GLUCOSE, CAPILLARY
Glucose-Capillary: 180 mg/dL — ABNORMAL HIGH (ref 70–99)
Glucose-Capillary: 266 mg/dL — ABNORMAL HIGH (ref 70–99)
Glucose-Capillary: 228 mg/dL — ABNORMAL HIGH (ref 70–99)
Glucose-Capillary: 296 mg/dL — ABNORMAL HIGH (ref 70–99)

## 2020-11-09 MED ORDER — PROSOURCE PLUS PO LIQD
30.0000 mL | Freq: Two times a day (BID) | ORAL | Status: DC
  Administered 2020-11-09 – 2020-11-19 (×21): 30 mL via ORAL
  Filled 2020-11-09 (×20): qty 30

## 2020-11-09 MED ORDER — ENOXAPARIN SODIUM 40 MG/0.4ML ~~LOC~~ SOLN
40.0000 mg | Freq: Every day | SUBCUTANEOUS | Status: DC
  Administered 2020-11-10: 40 mg via SUBCUTANEOUS
  Filled 2020-11-09: qty 0.4

## 2020-11-09 NOTE — Evaluation (Signed)
Physical Therapy Assessment and Plan  Patient Details  Name: Caleb Dougherty MRN: [id number] Date of Birth: [birth year]  PT Diagnosis: Abnormal posture, Abnormality of gait, Cognitive deficits, Difficulty walking, Impaired cognition and Muscle weakness Rehab Potential: Good ELOS: 10-12 days   Today's Date: 11/09/2020 PT Individual Time: 0830-0930 PT Individual Time Calculation (min): 60 min    Hospital Problem: Principal Problem:   Encephalopathy Active Problems:   Hypertensive emergency   Past Medical History:  Past Medical History:  Diagnosis Date  . Arthritis    osteoarthritis-knees and feet  . BPH (benign prostatic hypertrophy)   . Cancer [facility])    melanoma(back)-no futher issues. Skin cancers face "frozen"  . Cataract    02-12-14 left- ready for surgery, but only has had right done  . Diabetes mellitus without complication ([place])    diabetes x4 yrs-oral med and Lantus used  . HOH (hard of hearing)    wears bilateral hearing aids  . Hypertension   . Impaired hearing    hard of hearing -no hearing aids  . Urgency of urination   . Wears dentures    full lower  . Wears glasses    reading   Past Surgical History:  Past Surgical History:  Procedure Laterality Date  . CARPAL TUNNEL RELEASE Right 06/23/2013   Procedure: RIGHT CARPAL TUNNEL RELEASE;  Surgeon: Cammie Sickle., MD;  Location: [place];  Service: Orthopedics;  Laterality: Right;  . CATARACT EXTRACTION Right   . CATARACT EXTRACTION W/PHACO Left 03/30/2019   Procedure: CATARACT EXTRACTION PHACO AND INTRAOCULAR LENS PLACEMENT (IOC) LEFT DIABETES  02:21.8  19.2%  28.18;  Surgeon: Eulogio Bear, MD;  Location: [place];  Service: Ophthalmology;  Laterality: Left;  diabetic - insulin  . COLONOSCOPY    . INCISION AND DRAINAGE WOUND WITH FOREIGN BODY REMOVAL Left 12/07/2014   Procedure: INCISION AND DRAINAGE WOUND WITH FOREIGN BODY REMOVAL;  Surgeon: Corky Mull, MD;  Location: [facility]  ORS;  Service: Orthopedics;  Laterality: Left;  . REVERSE SHOULDER ARTHROPLASTY Left 11/19/2014   Procedure: LEFT REVERSE SHOULDER ARTHROPLASTY;  Surgeon: Netta Cedars, MD;  Location: [place];  Service: Orthopedics;  Laterality: Left;  . TOTAL KNEE ARTHROPLASTY Left 02/23/2014   Procedure: LEFT TOTAL KNEE REPLACEMENT;  Surgeon: Mauri Pole, MD;  [facility];  Service: Orthopedics;  Laterality: Left;  . TOTAL KNEE ARTHROPLASTY Right 05/25/2014   Procedure: RIGHT TOTAL KNEE ARTHROPLASTY;  Surgeon: Mauri Pole, MD;  [facility];  Service: Orthopedics;  Laterality: Right;    Assessment & Plan Clinical Impression: Patient is a 87 y.o. year old male with history of T2DM, melanoma, HOH who was admitted on 11/01/20 after being found by son with left sided weakness, leftward gaze, facial droop and global aphasia. Per reports, patient had had fluctuations in cognition for 2 weeks since getting his booster at the [place] and was being treated for UTI.  CTA head/neck was negative for LVO or perfusion deficits. BP elevated at admission- 212/85 and he was started on IV NTG for hypertensive emergency . Dr. Rory Percy following for input and expressed concerns of large left hemisphere stroke v/s seizure v/s CNS infection/encephalitis.  He was started on broad spectrum antibiotics as well as acyclovir but LP negative. On 04/06, he was noted to have resolution of weakness and facial droop with improvement in ability to follow commands but continued aphasia. EEG done revealing moderate diffuse encephalopathy and was negative for seizures. LP showed elevated protein  and he was found to have low vitamin B 12 levels.  CSF sent for CJD due to rapidly progressing dementia with hyperreflexia, question of myoclonus and some bradykinesia.   His mentation has improved with addition of Seroquel to manage sundowning and he was started on parental B12 supplement.  MRI brain done today as negative for retained bullet fragment  which  showed chronic lacunar infarct with nonspecific white matter changes and is negative for acute changes.  Speech now fluent with intact comprehension and MS changes felt to be due to delirium. He has had improvement in BP control but worsening of SCr to 1.79  felt to be due to ACE and he was treated wit fluid boluses.   CJD essentially ruled out and neurology has signed off.  He continues to have issues with weakness, tremulousness, apraxia with difficulty initiating tasks as well as difficulty processing. CIR recommended due to functional decline.    Patient currently requires min with mobility secondary to muscle weakness, decreased cardiorespiratoy endurance, decreased motor planning, decreased initiation, decreased attention, decreased awareness, decreased problem solving, decreased safety awareness, decreased memory and delayed processing and decreased sitting balance, decreased standing balance, decreased postural control and decreased balance strategies.  Prior to hospitalization, patient was independent  with mobility and lived with Spouse in a House home.  Home access is  Ramped entrance.  Patient will benefit from skilled PT intervention to maximize safe functional mobility, minimize fall risk and decrease caregiver burden for planned discharge home with 24 hour supervision.  Anticipate patient will benefit from follow up [place] at discharge.  PT - End of Session Activity Tolerance: Tolerates 30+ min activity with multiple rests Endurance Deficit: Yes Endurance Deficit Description: generalized weakness/deconditioning PT Assessment Rehab Potential (ACUTE/IP ONLY): Good PT Barriers to Discharge: Decreased caregiver support;Incontinence;Lack of/limited family support PT Barriers to Discharge Comments: caregiver to wife with dementia PT Patient demonstrates impairments in the following area(s): Balance;Behavior;Endurance;Motor;Nutrition;Pain;Perception;Safety;Sensory;Skin Integrity PT Transfers  Functional Problem(s): Bed Mobility;Bed to Chair;Car;Furniture PT Locomotion Functional Problem(s): Ambulation;Stairs PT Plan PT Intensity: Minimum of 1-2 x/day ,45 to 90 minutes PT Frequency: 5 out of 7 days PT Duration Estimated Length of Stay: 10-12 days PT Treatment/Interventions: Ambulation/gait training;DME/adaptive equipment instruction;Psychosocial support;UE/LE Strength taining/ROM;Balance/vestibular training;Functional electrical stimulation;Skin care/wound management;UE/LE Coordination activities;Cognitive remediation/compensation;Functional mobility training;Splinting/orthotics;Visual/perceptual remediation/compensation;Community reintegration;Neuromuscular re-education;Stair training;Wheelchair propulsion/positioning;Discharge planning;Pain management;Therapeutic Activities;Disease management/prevention;Patient/family education;Therapeutic Exercise PT Transfers Anticipated Outcome(s): spv PT Locomotion Anticipated Outcome(s): spv PT Recommendation Follow Up Recommendations: Home health PT Patient destination: Home Equipment Recommended: To be determined   PT Evaluation Precautions/Restrictions Precautions Precautions: Fall Precaution Comments: HOH Restrictions Weight Bearing Restrictions: No General   Vital Signs Pain Pain Assessment Pain Scale: 0-10 Pain Score: 0-No pain Home Living/Prior Functioning Home Living Living Arrangements: Spouse/significant other Available Help at Discharge: Family;Available PRN/intermittently Type of Home: House Home Access: Ramped entrance Home Layout: One level Bathroom Shower/Tub: Multimedia programmer: Standard Bathroom Accessibility: Yes Additional Comments: Pt takes care of wife with dementia  Lives With: Spouse Prior Function Level of Independence: Independent with basic ADLs;Independent with homemaking with ambulation;Independent with transfers;Independent with gait Driving: Yes Vocation: Retired Comments: Per  chart, patient required assist with basic cognition prior to admission Vision/Perception  Perception Perception: Within Functional Limits Praxis Praxis: Impaired Praxis Impairment Details: Initiation;Motor planning  Cognition Overall Cognitive Status: Impaired/Different from baseline Arousal/Alertness: Awake/alert Orientation Level: Oriented to person Attention: Selective Selective Attention: Appears intact Memory: Impaired Memory Impairment: Decreased recall of new information Immediate Memory Recall: Sock;Blue;Bed Memory Recall Sock: Not able to recall  Memory Recall Blue: Not able to recall Memory Recall Bed: Not able to recall Awareness: Impaired Awareness Impairment: Emergent impairment Problem Solving: Impaired Problem Solving Impairment: Verbal basic;Functional basic Executive Function:  (all impaired) Safety/Judgment: Impaired Sensation Sensation Light Touch: Appears Intact Hot/Cold: Not tested Proprioception: Appears Intact Stereognosis: Not tested Coordination Gross Motor Movements are Fluid and Coordinated: No Fine Motor Movements are Fluid and Coordinated: No Coordination and Movement Description: generalized weakness Heel Shin Test: mildly ataxic R LE> LLE Motor  Motor Motor: Abnormal tone;Abnormal postural alignment and control;Ataxia Motor - Skilled Clinical Observations: general weakness with mild gross ataxia   Trunk/Postural Assessment  Cervical Assessment Cervical Assessment: Exceptions to [facility] (forward head) Thoracic Assessment Thoracic Assessment: Exceptions to [facility] (rounded shoulders, kyphotic posture) Lumbar Assessment Lumbar Assessment: Exceptions to [facility] (posterior pelvic tilt) Postural Control Postural Control: Deficits on evaluation Righting Reactions: delayed and inadeqaute Protective Responses: delayed and inadequate  Balance Balance Balance Assessed: Yes Standardized Balance Assessment Standardized Balance Assessment: Timed Up and Go  Test Timed Up and Go Test TUG: Normal TUG Normal TUG (seconds): 32.82 (RW + MinA) Static Sitting Balance Static Sitting - Balance Support: Feet supported Static Sitting - Level of Assistance: 5: Stand by assistance Dynamic Sitting Balance Dynamic Sitting - Balance Support: Feet supported;During functional activity Dynamic Sitting - Level of Assistance: 5: Stand by assistance Static Standing Balance Static Standing - Balance Support: During functional activity;Bilateral upper extremity supported Static Standing - Level of Assistance: 4: Min assist Dynamic Standing Balance Dynamic Standing - Balance Support: During functional activity;Bilateral upper extremity supported Dynamic Standing - Level of Assistance: 4: Min assist Extremity Assessment  RUE Assessment RUE Assessment: Exceptions to [facility] General Strength Comments: Shoulder flexion limited to 100 degrees, may be baseline. Generalized weakness LUE Assessment LUE Assessment: Exceptions to [facility] General Strength Comments: Shoulder flexion limited to 100 degrees, may be baseline. Generalized weakness RLE Assessment RLE Assessment: Exceptions to [facility] RLE Strength Right Hip Flexion: 4-/5 Right Hip ABduction: 4-/5 Right Hip ADduction: 4-/5 Right Knee Flexion: 3+/5 Right Knee Extension: 3+/5 Right Ankle Dorsiflexion: 4-/5 Right Ankle Plantar Flexion: 4-/5 LLE Assessment LLE Assessment: Exceptions to [facility] LLE Strength Left Hip Flexion: 4-/5 Left Hip ABduction: 4-/5 Left Hip ADduction: 4-/5 Left Knee Flexion: 4-/5 Left Knee Extension: 4-/5 Left Ankle Dorsiflexion: 4-/5 Left Ankle Plantar Flexion: 4-/5  Care Tool Care Tool Bed Mobility Roll left and right activity   Roll left and right assist level: Contact Guard/Touching assist    Sit to lying activity   Sit to lying assist level: Contact Guard/Touching assist    Lying to sitting edge of bed activity   Lying to sitting edge of bed assist level: Contact Guard/Touching assist      Care Tool Transfers Sit to stand transfer   Sit to stand assist level: Moderate Assistance - Patient 50 - 74%    Chair/bed transfer   Chair/bed transfer assist level: Minimal Assistance - Patient > 75%     Toilet transfer   Assist Level: Minimal Assistance - Patient > 75%    Car transfer   Car transfer assist level: Minimal Assistance - Patient > 75%      Care Tool Locomotion Ambulation   Assist level: Minimal Assistance - Patient > 75% Assistive device: Walker-rolling Max distance: 30  Walk 10 feet activity   Assist level: Minimal Assistance - Patient > 75% Assistive device: Walker-rolling   Walk 50 feet with 2 turns activity Walk 50 feet with 2 turns activity did not occur: Safety/medical concerns  Walk 150 feet activity Walk 150 feet activity did not occur: Safety/medical concerns      Walk 10 feet on uneven surfaces activity   Assist level: Minimal Assistance - Patient > 75% Assistive device: Walker-rolling  Stairs          Walk up/down 1 step activity Walk up/down 1 step or curb (drop down) activity did not occur: Safety/medical concerns     Walk up/down 4 steps activity did not occuR: Safety/medical concerns  Walk up/down 4 steps activity      Walk up/down 12 steps activity Walk up/down 12 steps activity did not occur: Safety/medical concerns      Pick up small objects from floor Pick up small object from the floor (from standing position) activity did not occur: Safety/medical concerns      Wheelchair Will patient use wheelchair at discharge?: No          Wheel 50 feet with 2 turns activity      Wheel 150 feet activity        Refer to Care Plan for Long Term Goals  SHORT TERM GOAL WEEK 1 PT Short Term Goal 1 (Week 1): Patient will completed bed mobility with CGA consistently PT Short Term Goal 2 (Week 1): Patient will transfer bed <> wc with LRAD and CGA consistently PT Short Term Goal 3 (Week 1): Patient will ambulate >69f with LRAD  and CGA  Recommendations for other services: None   Skilled Therapeutic Intervention Mobility Bed Mobility Bed Mobility: Sit to Supine;Supine to Sit Supine to Sit: Contact Guard/Touching assist Sit to Supine: Contact Guard/Touching assist Transfers Transfers: Sit to Stand;Stand to Sit;Stand Pivot Transfers Sit to Stand: Moderate Assistance - Patient 50-74% Stand to Sit: Moderate Assistance - Patient 50-74% Stand Pivot Transfers: Moderate Assistance - Patient 50 - 74% Stand Pivot Transfer Details: Verbal cues for gait pattern;Verbal cues for technique;Verbal cues for precautions/safety;Verbal cues for safe use of DME/AE Transfer (Assistive device): Rolling walker Locomotion  Gait Ambulation: Yes Gait Assistance: Minimal Assistance - Patient > 75% Gait Distance (Feet): 30 Feet Assistive device: Rolling walker Gait Assistance Details: Verbal cues for precautions/safety;Verbal cues for technique;Verbal cues for gait pattern;Verbal cues for safe use of DME/AE Gait Gait: Yes Gait Pattern: Impaired Gait Pattern: Trunk flexed;Wide base of support;Ataxic;Decreased stride length;Poor foot clearance - right;Poor foot clearance - left Gait velocity: Decreased Stairs / Additional Locomotion Stairs: No Ramp: Minimal Assistance - Patient >75% Wheelchair Mobility Wheelchair Mobility: No  Patient received sitting up in wc, agreeable to PT eval. He denies pain. Adult son present to clarify that patient was previously caring for wife with dementia assisting with dressing, bathing, meal prep, med management, driving. Patient grossly MinA with ModA to stand up from standard height chair at times related to fatigue. Consistent verbal cues needed for hand placement for sit <> stand transitions. He demonstrated mildly delayed processing and poor motor planning. Mild ataxia noted in gait as well. At end of session, patient remaining up in wc, seatbelt alarm on, call light within reach.   Discharge  Criteria: Patient will be discharged from PT if patient refuses treatment 3 consecutive times without medical reason, if treatment goals not met, if there is a change in medical status, if patient makes no progress towards goals or if patient is discharged from hospital.  The above assessment, treatment plan, treatment alternatives and goals were discussed and mutually agreed upon: by patient  JDebbora Dus4/13/2022, 12:05 PM

## 2020-11-09 NOTE — Progress Notes (Signed)
Inpatient Rehabilitation Care Coordinator Assessment and Plan Patient Details  Name: Caleb Dougherty MRN: [id number] Date of Birth: 10-29-1928  Today's Date: 11/09/2020  Hospital Problems: Principal Problem:   Encephalopathy Active Problems:   Hypertensive emergency  Past Medical History:  Past Medical History:  Diagnosis Date  . Arthritis    osteoarthritis-knees and feet  . BPH (benign prostatic hypertrophy)   . Cancer [facility])    melanoma(back)-no futher issues. Skin cancers face "frozen"  . Cataract    02-12-14 left- ready for surgery, but only has had right done  . Diabetes mellitus without complication ([place])    diabetes x4 yrs-oral med and Lantus used  . HOH (hard of hearing)    wears bilateral hearing aids  . Hypertension   . Impaired hearing    hard of hearing -no hearing aids  . Urgency of urination   . Wears dentures    full lower  . Wears glasses    reading   Past Surgical History:  Past Surgical History:  Procedure Laterality Date  . CARPAL TUNNEL RELEASE Right 06/23/2013   Procedure: RIGHT CARPAL TUNNEL RELEASE;  Surgeon: Cammie Sickle., MD;  Location: [place];  Service: Orthopedics;  Laterality: Right;  . CATARACT EXTRACTION Right   . CATARACT EXTRACTION W/PHACO Left 03/30/2019   Procedure: CATARACT EXTRACTION PHACO AND INTRAOCULAR LENS PLACEMENT (IOC) LEFT DIABETES  02:21.8  19.2%  28.18;  Surgeon: Eulogio Bear, MD;  Location: [place];  Service: Ophthalmology;  Laterality: Left;  diabetic - insulin  . COLONOSCOPY    . INCISION AND DRAINAGE WOUND WITH FOREIGN BODY REMOVAL Left 12/07/2014   Procedure: INCISION AND DRAINAGE WOUND WITH FOREIGN BODY REMOVAL;  Surgeon: Corky Mull, MD;  Location: [facility];  Service: Orthopedics;  Laterality: Left;  . REVERSE SHOULDER ARTHROPLASTY Left 11/19/2014   Procedure: LEFT REVERSE SHOULDER ARTHROPLASTY;  Surgeon: Netta Cedars, MD;  Location: [place];  Service: Orthopedics;  Laterality:  Left;  . TOTAL KNEE ARTHROPLASTY Left 02/23/2014   Procedure: LEFT TOTAL KNEE REPLACEMENT;  Surgeon: Mauri Pole, MD;  [facility];  Service: Orthopedics;  Laterality: Left;  . TOTAL KNEE ARTHROPLASTY Right 05/25/2014   Procedure: RIGHT TOTAL KNEE ARTHROPLASTY;  Surgeon: Mauri Pole, MD;  [facility];  Service: Orthopedics;  Laterality: Right;   Social History:  reports that he has never smoked. He has never used smokeless tobacco. He reports that he does not drink alcohol and does not use drugs.  Family / Support Systems Marital Status: Married Patient Roles: Spouse,Parent,Caregiver Spouse/Significant Other: [name] [phone] Children: Elmer Ramp [phone]-cell  Curtis-son [phone]-cell Anticipated Caregiver: [place], daughter in-law and other son Curtis-may decide to hire assist if needed Ability/Limitations of Caregiver: [place] Bells works from home, his wife works out of the home and [name] lives a few miles from also Caregiver Availability: 24/7 Family Dynamics: Close knit family all live around one another, pt was caring for his wife who has dementia and requires physical care. Pt has some friends who call but all elderly like he and wife  Social History Preferred language: English Religion: Baptist Cultural Background: No issues Education: HS Read: Yes Write: Yes Employment Status: Retired Public relations account executive Issues: No issues Guardian/Conservator: None-according to MD pt is not fully capable of making his own decisions at this time. Will look toward his two son's since wife has dementia and unable to make any decisions for him while here   Abuse/Neglect Abuse/Neglect Assessment Can Be Completed: Yes Physical  Abuse: Denies Verbal Abuse: Denies Sexual Abuse: Denies Exploitation of patient/patient's resources: Denies Self-Neglect: Denies  Emotional Status Pt's affect, behavior and adjustment status: Pt is motivated to do well and get back home. He feesl better  than he did but is still fuzzy according to him. He has always taken care of himself and has been assisting his wife with her ADL's, due to her dementia. Recent Psychosocial Issues: Pt thought he was healthy prior to admission before this happened Psychiatric History: No issues/history deferred depression screen due to somewhat confused still. Will ask neuro-psych to see while here for cognition and coping Substance Abuse History: No issues  Patient / Family Perceptions, Expectations & Goals Pt/Family understanding of illness & functional limitations: Pt and son can explain his infection but son knows more than pt. Both talk with the MD rounding and feel they have a good understanding of his plan going forward. Both hope he does well here and makes good progress. Premorbid pt/family roles/activities: Husband, father, grandfather, retiree, church member, caregiver for wife Anticipated changes in roles/activities/participation: resume Pt/family expectations/goals: Pt states: " I want to get home when I am able."  [place] Bells states: " I hope he can help himself we can help my mom, we are now."  [employer]: None Premorbid Home Care/DME Agencies: Other (Comment) (has rw, cane, wheelchair and tub seat) Transportation available at discharge: Pt drove PTA-son wil be transporting to appointments. Resource referrals recommended: Neuropsychology  Discharge Planning Living Arrangements: Spouse/significant other Support Systems: Spouse/significant other,Children,Other relatives,Friends/neighbors Type of Residence: Private residence Insurance Resources: Multimedia programmer (specify) Primary school teacher) Financial Resources: Radio broadcast assistant Screen Referred: No Living Expenses: Own Money Management: Patient Does the patient have any problems obtaining your medications?: No Home Management: Pt did since wife can not assist Patient/Family Preliminary Plans: Return home with wife  and son and daughter in-law along with other son wil be coming in to assist. [place] lives across the street from pt and wife. Aware pt and wife will need 24/7 care at discharge from rehab. Even if pt is mod/i he will not be caring for wife at [facility] aware of this. Aware of team evaluating and setting goals today. Care Coordinator Barriers to Discharge: Decreased caregiver support Care Coordinator Barriers to Discharge Comments: Pt was the caregiver for his wife prior to admission Care Coordinator Anticipated Follow Up Needs: HH/OP  Clinical Impression Pleasant gentleman who is clearing cognitively and realizes where he is. He was the caregiver for his wife with dementia prior to admission. Currently son-[place] and daughter in-law are providing care to her and she is at their home across the street from pt's home. Aware pt will need assist at discharge and he may not be able to assist his wife upon discharge. Two son's and daughter in-law to assist and if too much will hire assist. Await team evaluations and work on discharge needs.  Elease Hashimoto 11/09/2020, 10:47 AM

## 2020-11-09 NOTE — Evaluation (Signed)
Speech Language Pathology Assessment and Plan  Patient Details  Name: Caleb Dougherty MRN: [id number] Date of Birth: 10-Nov-1928  SLP Diagnosis: Cognitive Impairments  Rehab Potential: Good ELOS: 14 days    Today's Date: 11/09/2020 SLP Individual Time: 1400-1455 SLP Individual Time Calculation (min): 55 min   Hospital Problem: Principal Problem:   Encephalopathy Active Problems:   Hypertensive emergency  Past Medical History:  Past Medical History:  Diagnosis Date  . Arthritis    osteoarthritis-knees and feet  . BPH (benign prostatic hypertrophy)   . Cancer [facility])    melanoma(back)-no futher issues. Skin cancers face "frozen"  . Cataract    02-12-14 left- ready for surgery, but only has had right done  . Diabetes mellitus without complication ([place])    diabetes x4 yrs-oral med and Lantus used  . HOH (hard of hearing)    wears bilateral hearing aids  . Hypertension   . Impaired hearing    hard of hearing -no hearing aids  . Urgency of urination   . Wears dentures    full lower  . Wears glasses    reading   Past Surgical History:  Past Surgical History:  Procedure Laterality Date  . CARPAL TUNNEL RELEASE Right 06/23/2013   Procedure: RIGHT CARPAL TUNNEL RELEASE;  Surgeon: Cammie Sickle., MD;  Location: [place];  Service: Orthopedics;  Laterality: Right;  . CATARACT EXTRACTION Right   . CATARACT EXTRACTION W/PHACO Left 03/30/2019   Procedure: CATARACT EXTRACTION PHACO AND INTRAOCULAR LENS PLACEMENT (IOC) LEFT DIABETES  02:21.8  19.2%  28.18;  Surgeon: Eulogio Bear, MD;  Location: [place];  Service: Ophthalmology;  Laterality: Left;  diabetic - insulin  . COLONOSCOPY    . INCISION AND DRAINAGE WOUND WITH FOREIGN BODY REMOVAL Left 12/07/2014   Procedure: INCISION AND DRAINAGE WOUND WITH FOREIGN BODY REMOVAL;  Surgeon: Corky Mull, MD;  Location: [facility];  Service: Orthopedics;  Laterality: Left;  . REVERSE SHOULDER ARTHROPLASTY Left  11/19/2014   Procedure: LEFT REVERSE SHOULDER ARTHROPLASTY;  Surgeon: Netta Cedars, MD;  Location: [place];  Service: Orthopedics;  Laterality: Left;  . TOTAL KNEE ARTHROPLASTY Left 02/23/2014   Procedure: LEFT TOTAL KNEE REPLACEMENT;  Surgeon: Mauri Pole, MD;  Location: [facility];  Service: Orthopedics;  Laterality: Left;  . TOTAL KNEE ARTHROPLASTY Right 05/25/2014   Procedure: RIGHT TOTAL KNEE ARTHROPLASTY;  Surgeon: Mauri Pole, MD;  Location: [facility];  Service: Orthopedics;  Laterality: Right;    Assessment / Plan / Recommendation Clinical Impression   Caleb Dougherty is an 87 year old male with history of T2DM, melanoma, HOH who was admitted on 11/01/20 after being found by son with left sided weakness, leftward gaze, facial droop and global aphasia. Per reports, patient had had fluctuations in cognition for 2 weeks since getting his booster at the [place] and was being treated for UTI.  CTA head/neck was negative for LVO or perfusion deficits. BP elevated at admission- 212/85 and he was started on IV NTG for hypertensive emergency . Dr. Rory Percy following for input and expressed concerns of large left hemisphere stroke v/s seizure v/s CNS infection/encephalitis.  He was started on broad spectrum antibiotics as well as acyclovir but LP negative. On 04/06, he was noted to have resolution of weakness and facial droop with improvement in ability to follow commands but continued aphasia. EEG done revealing moderate diffuse encephalopathy and was negative for seizures. LP showed elevated protein and he was found to have  low vitamin B 12 levels.  CSF sent for CJD due to rapidly progressing dementia with hyperreflexia, question of myoclonus and some bradykinesia.   His mentation has improved with addition of Seroquel to manage sundowning and he was started on parental B12 supplement.  MRI brain done today as negative for retained bullet fragment  which showed chronic lacunar infarct with nonspecific white matter  changes and is negative for acute changes.  Speech now fluent with intact comprehension and MS changes felt to be due to delirium. He has had improvement in BP control but worsening of SCr to 1.79  felt to be due to ACE and he was treated wit fluid boluses.   CJD essentially ruled out and neurology has signed off.  He continues to have issues with weakness, tremulousness, apraxia with difficulty initiating tasks as well as difficulty processing. CIR recommended due to functional decline.    Cognistat was administered to formally assess patient's cognitive linguistic skills. Pt presents with moderate cognitive linguistic impairment characterized by deficits in memory, orientation, problem solving, and safety awareness. He requires increased time for verbal expression of thoughts and wants/needs. Pt unable to recall 0/4 words after a delay when given choices of 3 options. He demonstrated slow processing when responding to questions in conversation but responses were accurate.  Max A visual cues needed with  mental calculations subtest. He required visual cues x4 to increase accuracy with Catering manager subtest. Pt was oriented to month and year and was unable to recall date, day of the week, or days in rehab. Pt reports he completed medication management independently prior to hospital admission and son assisted with finances. Pt will benefit from skilled SLP intervention to maximize functional independence prior to discharge.      Skilled Therapeutic Interventions          Cognistat completed to assess patients cognitive skills. Pt in agreement with plan of care.   SLP Assessment  Patient will need skilled [facility] during CIR admission    Recommendations  Patient destination: Home Follow up Recommendations: Home Health SLP;Outpatient SLP Equipment Recommended: None recommended by SLP    SLP Frequency 3 to 5 out of 7 days   SLP Duration  SLP Intensity  SLP  Treatment/Interventions 14 days  Minumum of 1-2 x/day, 30 to 90 minutes  Cognitive remediation/compensation;Functional tasks;Therapeutic Activities;Patient/family education    Pain Pain Assessment Pain Scale: Faces Faces Pain Scale: No hurt  Prior Functioning Cognitive/Linguistic Baseline: Baseline deficits Type of Home: House  Lives With: Spouse Available Help at Discharge: Family;Available PRN/intermittently Vocation: Retired  SLP Evaluation Cognition Overall Cognitive Status: Impaired/Different from baseline Arousal/Alertness: Awake/alert Orientation Level: Oriented to person;Oriented to place;Oriented to time Attention: Selective Selective Attention: Appears intact Memory: Impaired Memory Impairment: Decreased recall of new information Problem Solving: Impaired Problem Solving Impairment: Verbal basic;Functional basic Safety/Judgment: Impaired  Comprehension Auditory Comprehension Overall Auditory Comprehension: Impaired Yes/No Questions: Within Functional Limits Commands: Within Functional Limits Conversation: Simple Interfering Components: Processing speed;Working memory EffectiveTechniques: Repetition;Extra processing time Visual Recognition/Discrimination Discrimination: Not tested Reading Comprehension Reading Status: Not tested Expression Expression Primary Mode of Expression: Verbal Verbal Expression Overall Verbal Expression: Appears within functional limits for tasks assessed Pragmatics: No impairment Oral Motor Oral Motor/Sensory Function Overall Oral Motor/Sensory Function: Within functional limits  Care Tool Care Tool Cognition Expression of Ideas and Wants     Understanding Verbal and Non-Verbal Content     Memory/Recall Ability *first 3 days only  Short Term Goals: Week 1: SLP Short Term Goal 1 (Week 1): Pt will utilize external memory aids to recall new, daily information with Min A verbal and question cues. SLP Short Term  Goal 2 (Week 1): Pt will orient to date and medical situation with use of calendar with mod A verbal and visual cues. SLP Short Term Goal 3 (Week 1): Pt will complete basic problem solving tasks with Mod A verbal and visual cues. SLP Short Term Goal 4 (Week 1): Pt will demonstrate intellectual awareness and identify 3 physical limitations with Mod A verbal and visual cues.  Refer to Care Plan for Long Term Goals  Recommendations for other services: None   Discharge Criteria: Patient will be discharged from SLP if patient refuses treatment 3 consecutive times without medical reason, if treatment goals not met, if there is a change in medical status, if patient makes no progress towards goals or if patient is discharged from hospital.  The above assessment, treatment plan, treatment alternatives and goals were discussed and mutually agreed upon: by patient  [place] 11/09/2020, 2:47 PM

## 2020-11-09 NOTE — Progress Notes (Signed)
Inpatient Rehabilitation Center Individual Statement of Services  Patient Name:  Caleb Dougherty  Date:  11/09/2020  Welcome to the [facility].  Our goal is to provide you with an individualized program based on your diagnosis and situation, designed to meet your specific needs.  With this comprehensive rehabilitation program, you will be expected to participate in at least 3 hours of rehabilitation therapies Monday-Friday, with modified therapy programming on the weekends.  Your rehabilitation program will include the following services:  Physical Therapy (PT), Occupational Therapy (OT), Speech Therapy (ST), 24 hour per day rehabilitation nursing, Neuropsychology, Care Coordinator, Rehabilitation Medicine, Nutrition Services and Pharmacy Services  Weekly team conferences will be held on Tuesday to discuss your progress.  Your Inpatient Rehabilitation Care Coordinator will talk with you frequently to get your input and to update you on team discussions.  Team conferences with you and your family in attendance may also be held.  Expected length of stay: 10-12 days  Overall anticipated outcome: Supervision level with some cueing  Depending on your progress and recovery, your program may change. Your Inpatient Rehabilitation Care Coordinator will coordinate services and will keep you informed of any changes. Your Inpatient Rehabilitation Care Coordinator's name and contact numbers are listed  below.  The following services may also be recommended but are not provided by the [facility] will be made to provide these services after discharge if needed.  Arrangements include referral to agencies that provide these services.  Your insurance has been verified to be:  [employer]-Medicare Your primary doctor is:  Navarre Diana  Pertinent information will  be shared with your doctor and your insurance company.  Inpatient Rehabilitation Care Coordinator:  Ovidio Kin, [place] or ([phone]  Information discussed with and copy given to patient by: Elease Hashimoto, 11/09/2020, 11:00 AM

## 2020-11-09 NOTE — Evaluation (Signed)
Occupational Therapy Assessment and Plan  Patient Details  Name: Caleb Dougherty MRN: [id number] Date of Birth: 06-30-1929  OT Diagnosis: cognitive deficits and muscle weakness (generalized) Rehab Potential: Rehab Potential (ACUTE ONLY): Good ELOS: 10-12 days   Today's Date: 11/09/2020 OT Individual Time: 8638-1771 OT Individual Time Calculation (min): 60 min     Hospital Problem: Principal Problem:   Encephalopathy Active Problems:   Hypertensive emergency   Past Medical History:  Past Medical History:  Diagnosis Date  . Arthritis    osteoarthritis-knees and feet  . BPH (benign prostatic hypertrophy)   . Cancer [facility])    melanoma(back)-no futher issues. Skin cancers face "frozen"  . Cataract    02-12-14 left- ready for surgery, but only has had right done  . Diabetes mellitus without complication ([place])    diabetes x4 yrs-oral med and Lantus used  . HOH (hard of hearing)    wears bilateral hearing aids  . Hypertension   . Impaired hearing    hard of hearing -no hearing aids  . Urgency of urination   . Wears dentures    full lower  . Wears glasses    reading   Past Surgical History:  Past Surgical History:  Procedure Laterality Date  . CARPAL TUNNEL RELEASE Right 06/23/2013   Procedure: RIGHT CARPAL TUNNEL RELEASE;  Surgeon: Cammie Sickle., MD;  Location: [place];  Service: Orthopedics;  Laterality: Right;  . CATARACT EXTRACTION Right   . CATARACT EXTRACTION W/PHACO Left 03/30/2019   Procedure: CATARACT EXTRACTION PHACO AND INTRAOCULAR LENS PLACEMENT (IOC) LEFT DIABETES  02:21.8  19.2%  28.18;  Surgeon: Eulogio Bear, MD;  Location: [place];  Service: Ophthalmology;  Laterality: Left;  diabetic - insulin  . COLONOSCOPY    . INCISION AND DRAINAGE WOUND WITH FOREIGN BODY REMOVAL Left 12/07/2014   Procedure: INCISION AND DRAINAGE WOUND WITH FOREIGN BODY REMOVAL;  Surgeon: Corky Mull, MD;  Location: [facility];  Service: Orthopedics;   Laterality: Left;  . REVERSE SHOULDER ARTHROPLASTY Left 11/19/2014   Procedure: LEFT REVERSE SHOULDER ARTHROPLASTY;  Surgeon: Netta Cedars, MD;  Location: [place];  Service: Orthopedics;  Laterality: Left;  . TOTAL KNEE ARTHROPLASTY Left 02/23/2014   Procedure: LEFT TOTAL KNEE REPLACEMENT;  Surgeon: Mauri Pole, MD;  Location: [facility];  Service: Orthopedics;  Laterality: Left;  . TOTAL KNEE ARTHROPLASTY Right 05/25/2014   Procedure: RIGHT TOTAL KNEE ARTHROPLASTY;  Surgeon: Mauri Pole, MD;  Location: [facility];  Service: Orthopedics;  Laterality: Right;    Assessment & Plan Clinical Impression: Caleb Dougherty is an 87 year old male with history of T2DM, melanoma, HOH who was admitted on 11/01/20 after being found by son with left sided weakness, leftward gaze, facial droop and global aphasia. Per reports, patient had had fluctuations in cognition for 2 weeks since getting his booster at the [place] and was being treated for UTI.  CTA head/neck was negative for LVO or perfusion deficits. BP elevated at admission- 212/85 and he was started on IV NTG for hypertensive emergency . Dr. Rory Percy following for input and expressed concerns of large left hemisphere stroke v/s seizure v/s CNS infection/encephalitis.  He was started on broad spectrum antibiotics as well as acyclovir but LP negative. On 04/06, he was noted to have resolution of weakness and facial droop with improvement in ability to follow commands but continued aphasia. EEG done revealing moderate diffuse encephalopathy and was negative for seizures. LP showed elevated protein and he  was found to have low vitamin B 12 levels.  CSF sent for CJD due to rapidly progressing dementia with hyperreflexia, question of myoclonus and some bradykinesia.   His mentation has improved with addition of Seroquel to manage sundowning and he was started on parental B12 supplement.  MRI brain done today as negative for retained bullet fragment  which showed chronic lacunar  infarct with nonspecific white matter changes and is negative for acute changes.  Speech now fluent with intact comprehension and MS changes felt to be due to delirium. He has had improvement in BP control but worsening of SCr to 1.79  felt to be due to ACE and he was treated wit fluid boluses.   CJD essentially ruled out and neurology has signed off.  He continues to have issues with weakness, tremulousness, apraxia with difficulty initiating tasks as well as difficulty processing. CIR recommended due to functional decline.    Patient transferred to CIR on 11/08/2020 .    Patient currently requires min with basic self-care skills secondary to muscle weakness, decreased cardiorespiratoy endurance, decreased initiation, decreased awareness, decreased problem solving, decreased safety awareness and delayed processing and decreased standing balance, decreased postural control and decreased balance strategies.  Prior to hospitalization, patient could complete ADls with modified independent .  Patient will benefit from skilled intervention to decrease level of assist with basic self-care skills prior to discharge home with care partner.  Anticipate patient will require 24 hour supervision and follow up home health.  OT - End of Session Activity Tolerance: Tolerates 10 - 20 min activity with multiple rests Endurance Deficit: Yes Endurance Deficit Description: generalized weakness/deconditioning OT Assessment Rehab Potential (ACUTE ONLY): Good OT Barriers to Discharge: Decreased caregiver support OT Barriers to Discharge Comments: Pt was the caregiver for his wife, they will both require assistance OT Patient demonstrates impairments in the following area(s): Balance;Cognition;Safety;Endurance;Motor;Skin Integrity OT Basic ADL's Functional Problem(s): Bathing;Dressing;Toileting OT Transfers Functional Problem(s): Toilet;Tub/Shower OT Additional Impairment(s): None OT Plan OT Intensity: Minimum of 1-2  x/day, 45 to 90 minutes OT Frequency: 5 out of 7 days OT Duration/Estimated Length of Stay: 10-12 days OT Treatment/Interventions: Balance/vestibular training;Patient/family education;Self Care/advanced ADL retraining;Therapeutic Exercise;UE/LE Strength taining/ROM;Therapeutic Activities;Skin care/wound managment;Psychosocial support;Functional mobility training;DME/adaptive equipment instruction;Discharge planning;Cognitive remediation/compensation;Community reintegration OT Self Feeding Anticipated Outcome(s): no goal set OT Basic Self-Care Anticipated Outcome(s): set up assist OT Toileting Anticipated Outcome(s): supervision OT Bathroom Transfers Anticipated Outcome(s): supervision OT Recommendation Patient destination: Home Follow Up Recommendations: Home health OT Equipment Recommended: To be determined   OT Evaluation Precautions/Restrictions  Precautions Precautions: Fall Precaution Comments: HOH Restrictions Weight Bearing Restrictions: No General Chart Reviewed: Yes Family/Caregiver Present: No Vital Signs  Pain Pain Assessment Pain Scale: 0-10 Pain Score: 4  Pain Type: Acute pain Pain Location: Neck Pain Descriptors / Indicators: Sore Pain Frequency: Intermittent Pain Onset: Gradual Patients Stated Pain Goal: 0 Pain Intervention(s): Medication (See eMAR) Home Living/Prior Functioning Home Living Family/patient expects to be discharged to:: Private residence Living Arrangements: Spouse/significant other Available Help at Discharge: Family,Available PRN/intermittently Type of Home: House Home Access: Ramped entrance Home Layout: One level Bathroom Shower/Tub: Multimedia programmer: Standard Bathroom Accessibility: Yes Additional Comments: Pt takes care of wife with dementia  Lives With: Spouse IADL History Homemaking Responsibilities: Yes (need to clarify this- pt poor historian, but likely primary for homemaking) Meal Prep Responsibility:  Primary Laundry Responsibility: Primary Cleaning Responsibility: Primary Bill Paying/Finance Responsibility: Primary Occupation: Retired Prior Function Level of Independence: Independent with basic ADLs,Independent with homemaking with ambulation,Independent with  transfers,Independent with gait Driving: Yes Vocation: Retired Comments: Per chart, patient required assist with basic cognition prior to admission Vision Baseline Vision/History: Wears glasses Wears Glasses: Reading only Patient Visual Report: No change from baseline Vision Assessment?: No apparent visual deficits Perception  Perception: Within Functional Limits Praxis Praxis: Impaired Praxis Impairment Details: Initiation;Motor planning Cognition Overall Cognitive Status: Impaired/Different from baseline Arousal/Alertness: Awake/alert Orientation Level: Person;Place Year: Other (Comment) (would not state, guessed correctly out of 2 options) Month: April Day of Week: Correct Memory: Impaired Memory Impairment: Decreased recall of new information Immediate Memory Recall: Sock;Blue;Bed Memory Recall Sock: Not able to recall Memory Recall Blue: Not able to recall Memory Recall Bed: Not able to recall Attention: Selective Selective Attention: Appears intact Awareness: Impaired Awareness Impairment: Emergent impairment Problem Solving: Impaired Problem Solving Impairment: Verbal basic;Functional basic Executive Function:  (all impaired) Safety/Judgment: Impaired Sensation Sensation Light Touch: Appears Intact Hot/Cold: Not tested Proprioception: Appears Intact Stereognosis: Not tested Coordination Gross Motor Movements are Fluid and Coordinated: No Fine Motor Movements are Fluid and Coordinated: No Coordination and Movement Description: generalized weakness Heel Shin Test: mildly ataxic R LE> LLE Motor  Motor Motor: Abnormal tone;Abnormal postural alignment and control;Ataxia Motor - Skilled Clinical  Observations: general weakness with mild gross ataxia  Trunk/Postural Assessment  Cervical Assessment Cervical Assessment: Exceptions to [facility] (forward head) Thoracic Assessment Thoracic Assessment: Exceptions to [facility] (rounded shoulders, kyphotic posture) Lumbar Assessment Lumbar Assessment: Exceptions to [facility] (posterior pelvic tilt) Postural Control Postural Control: Deficits on evaluation Righting Reactions: delayed and inadeqaute Protective Responses: delayed and inadequate  Balance Balance Balance Assessed: Yes Standardized Balance Assessment Standardized Balance Assessment: Timed Up and Go Test Timed Up and Go Test TUG: Normal TUG Normal TUG (seconds): 32.82 (RW + MinA) Static Sitting Balance Static Sitting - Balance Support: Feet supported Static Sitting - Level of Assistance: 5: Stand by assistance Dynamic Sitting Balance Dynamic Sitting - Balance Support: Feet supported;During functional activity Dynamic Sitting - Level of Assistance: 5: Stand by assistance Static Standing Balance Static Standing - Balance Support: During functional activity;Bilateral upper extremity supported Static Standing - Level of Assistance: 4: Min assist Dynamic Standing Balance Dynamic Standing - Balance Support: During functional activity;Bilateral upper extremity supported Dynamic Standing - Level of Assistance: 4: Min assist Extremity/Trunk Assessment RUE Assessment RUE Assessment: Exceptions to [facility] General Strength Comments: Shoulder flexion limited to 100 degrees, may be baseline. Generalized weakness LUE Assessment LUE Assessment: Exceptions to [facility] General Strength Comments: Shoulder flexion limited to 100 degrees, may be baseline. Generalized weakness  Care Tool Care Tool Self Care Eating   Eating Assist Level: Set up assist    Oral Care    Oral Care Assist Level: Minimal Assistance - Patient > 75% (standing)    Bathing   Body parts bathed by patient: Left arm;Right upper leg;Left  upper leg;Chest;Right arm;Abdomen;Front perineal area;Left lower leg;Buttocks;Face;Right lower leg     Assist Level: Minimal Assistance - Patient > 75%    Upper Body Dressing(including orthotics)   What is the patient wearing?: Pull over shirt   Assist Level: Supervision/Verbal cueing    Lower Body Dressing (excluding footwear)   What is the patient wearing?: Pants;Incontinence brief Assist for lower body dressing: Moderate Assistance - Patient 50 - 74%    Putting on/Taking off footwear   What is the patient wearing?: Non-skid slipper socks Assist for footwear: Moderate Assistance - Patient 50 - 74%       Care Tool Toileting Toileting activity   Assist for toileting: Moderate Assistance -  Patient 50 - 74%     Care Tool Bed Mobility Roll left and right activity   Roll left and right assist level: Contact Guard/Touching assist    Sit to lying activity   Sit to lying assist level: Contact Guard/Touching assist    Lying to sitting edge of bed activity   Lying to sitting edge of bed assist level: Contact Guard/Touching assist     Care Tool Transfers Sit to stand transfer   Sit to stand assist level: Moderate Assistance - Patient 50 - 74%    Chair/bed transfer   Chair/bed transfer assist level: Minimal Assistance - Patient > 75%     Toilet transfer   Assist Level: Minimal Assistance - Patient > 75%     Care Tool Cognition Expression of Ideas and Wants Expression of Ideas and Wants: Frequent difficulty - frequently exhibits difficulty with expressing needs and ideas   Understanding Verbal and Non-Verbal Content Understanding Verbal and Non-Verbal Content: Usually understands - understands most conversations, but misses some part/intent of message. Requires cues at times to understand   Memory/Recall Ability *first 3 days only Memory/Recall Ability *first 3 days only: That he or she is in a hospital/hospital unit    Refer to Care Plan for [place] 1 OT Short Term Goal 1 (Week 1): Pt will don pants with CGA OT Short Term Goal 2 (Week 1): Pt will transfer to toilet with CGA OT Short Term Goal 3 (Week 1): Pt will demonstrate improved orientation with use of external cues if necessary OT Short Term Goal 4 (Week 1): Pt will require no more than min cueing for emergent awareness  Recommendations for other services: None    Skilled Therapeutic Intervention Pt supine in bed eating breakfast upon arrival. Pt polite and motivated to participate. He required min cueing for initiation during session. ADLs and functional mobility described below, as well as details of evaluation above. Pt is limited by generalized weakness, decreased balance/postural control, and cognitive deficits. Pt left sitting up in the w/c with all needs met, chair alarm set.   ADL ADL Eating: Modified independent Where Assessed-Eating: Bed level Grooming: Minimal assistance Where Assessed-Grooming: Standing at sink Upper Body Bathing: Supervision/safety Where Assessed-Upper Body Bathing: Sitting at sink Lower Body Bathing: Minimal assistance Where Assessed-Lower Body Bathing: Standing at sink;Sitting at sink Upper Body Dressing: Supervision/safety Where Assessed-Upper Body Dressing: Sitting at sink Lower Body Dressing: Minimal assistance Where Assessed-Lower Body Dressing: Sitting at sink Toileting: Moderate assistance Where Assessed-Toileting: Toilet;Bedside Commode Toilet Transfer: Moderate assistance Toilet Transfer Method: Counselling psychologist: Grab bars;Bedside commode Mobility  Bed Mobility Bed Mobility: Sit to Supine;Supine to Sit Supine to Sit: Contact Guard/Touching assist Sit to Supine: Contact Guard/Touching assist Transfers Sit to Stand: Moderate Assistance - Patient 50-74% Stand to Sit: Moderate Assistance - Patient 50-74%   Discharge Criteria: Patient will be discharged from OT if patient refuses treatment 3 consecutive  times without medical reason, if treatment goals not met, if there is a change in medical status, if patient makes no progress towards goals or if patient is discharged from hospital.  The above assessment, treatment plan, treatment alternatives and goals were discussed and mutually agreed upon: by patient  Curtis Sites 11/09/2020, 9:10 AM

## 2020-11-09 NOTE — Progress Notes (Signed)
Initial Nutrition Assessment  DOCUMENTATION CODES:   Non-severe (moderate) malnutrition in context of chronic illness  INTERVENTION:   - Magic Cup BID with lunch and dinner meals, each supplement provides 290 kcal and 9 grams of protein  - Continue ProSource Plus 30 ml po BID, each supplement provides 100 kcal and 15 grams of protein  - Encourage adequate PO intake  NUTRITION DIAGNOSIS:   Moderate Malnutrition related to chronic illness as evidenced by mild fat depletion,moderate fat depletion,mild muscle depletion,moderate muscle depletion.  GOAL:   Patient will meet greater than or equal to 90% of their needs  MONITOR:   PO intake,Supplement acceptance,Labs,Weight trends  REASON FOR ASSESSMENT:   Malnutrition Screening Tool    ASSESSMENT:   87 year old male with PMH of T2DM, melanoma, HOH who was admitted on 11/01/20 with with left-sided weakness, leftward gaze, facial droop, and global aphasia. On 4/6, he was noted to have resolution of weakness and facial droop with improvement in ability to follow commands but continued aphasia. LP completed and showed elevated protein. Pt was also found to have low vitamin B-12 levels. Admitted to CIR on 4/12.   Spoke with pt at bedside. Pt reports good PO intake today. Noted 100% meal completions documented for breakfast and lunch today. Pt reports that he was able to eat better today because he was sitting in the chair rather than in the bed.  Pt reports that he had a good appetite PTA. He states that he consumed 3 meals daily. Pt reports a UBW of 180 lbs but is unsure when he last weighed this. He suspects it was over one year ago. At this point in conversation, SLP arrived for speech therapy so interview was cut short. Pt does not want to consume Ensure but is willing to try [place]. RD to order.  Reviewed weight history in chart. Weight appears table over the last few years. Weight in 2016 was 77.6 kg and current weight is 75.8  kg.  Meal Completion: 100% x 2 meals  Medications reviewed and include: ProSource Plus BID, vitamin B-12 injection weekly, SSI, lantus 30 units daily, vitamin B-12 1000 mg po daily  Labs reviewed: BUN 37, creatinine 1.37 CBG's: 180-351 x 24 hours  NUTRITION - FOCUSED PHYSICAL EXAM:  Flowsheet Row Most Recent Value  Orbital Region Mild depletion  Upper Arm Region Mild depletion  Thoracic and Lumbar Region Moderate depletion  Buccal Region Mild depletion  Temple Region No depletion  Clavicle Bone Region Mild depletion  Clavicle and Acromion Bone Region Moderate depletion  Scapular Bone Region Mild depletion  Dorsal Hand Mild depletion  Patellar Region Mild depletion  Anterior Thigh Region Mild depletion  Posterior Calf Region Mild depletion  Edema (RD Assessment) None  Hair Reviewed  Eyes Reviewed  Mouth Reviewed  Skin Reviewed  Nails Reviewed       Diet Order:   Diet Order            Diet Carb Modified Fluid consistency: Thin; Room service appropriate? Yes with Assist  Diet effective now                 EDUCATION NEEDS:   Education needs have been addressed  Skin:  Skin Assessment: Reviewed RN Assessment  Last BM:  11/09/20  Height:   Ht Readings from Last 1 Encounters:  11/08/20 5\' 8"  (1.727 m)    Weight:   Wt Readings from Last 1 Encounters:  11/08/20 75.8 kg    BMI:  Body mass index  is 25.42 kg/m.  Estimated Nutritional Needs:   Kcal:  1700-1900  Protein:  80-95 grams  Fluid:  1.7-1.9 L    Gustavus Bryant, MS, RD, LDN Inpatient Clinical Dietitian Please see AMi[facility] for contact information.

## 2020-11-10 LAB — GLUCOSE, CAPILLARY
Glucose-Capillary: 134 mg/dL — ABNORMAL HIGH (ref 70–99)
Glucose-Capillary: 185 mg/dL — ABNORMAL HIGH (ref 70–99)
Glucose-Capillary: 245 mg/dL — ABNORMAL HIGH (ref 70–99)
Glucose-Capillary: 264 mg/dL — ABNORMAL HIGH (ref 70–99)

## 2020-11-10 NOTE — Progress Notes (Signed)
Speech Language Pathology Daily Session Note  Patient Details  Name: Caleb Dougherty MRN: [id number] Date of Birth: 10-Jul-1929  Today's Date: 11/10/2020 SLP Individual Time: 1300-1400 SLP Individual Time Calculation (min): 60 min  Short Term Goals: Week 1: SLP Short Term Goal 1 (Week 1): Pt will utilize external memory aids to recall new, daily information with Min A verbal and question cues. SLP Short Term Goal 2 (Week 1): Pt will orient to date and medical situation with use of calendar with mod A verbal and visual cues. SLP Short Term Goal 3 (Week 1): Pt will complete basic problem solving tasks with Mod A verbal and visual cues. SLP Short Term Goal 4 (Week 1): Pt will demonstrate intellectual awareness and identify 3 physical limitations with Mod A verbal and visual cues.  Skilled Therapeutic Interventions: Skilled SLP intervention focused on cognition. Subtests of ALFA were completed to assess functional cognitive linguistic skills. Pt was mod A with verbal and visual  cues due to decreased error awareness and decreased organization for basic problem solving questions. when counting money and solving daily math problems he often became confused and needed information repeated. Increased time needed to locate relevant information when scanning medicine labels . Pt reported he was feeling dizzy and increased attention to details and accuracy after having a short break from cognitive subtests. Pt left seated upright in bed with bed alarm set and call button within reach. Cont with therapy per plan of care.      Pain Pain Assessment Pain Scale: Faces Pain Score: 3  Faces Pain Scale: No hurt Pain Type: Acute pain Pain Location: Neck Pain Orientation: Right Pain Descriptors / Indicators: Aching Pain Frequency: Intermittent Pain Onset: Gradual Patients Stated Pain Goal: 4 Pain Intervention(s): Medication (See eMAR)  Therapy/Group: Individual Therapy  Darrol Poke Kenzel Ruesch 11/10/2020, 1:49  PM

## 2020-11-10 NOTE — Progress Notes (Signed)
Occupational Therapy Session Note  Patient Details  Name: Caleb Dougherty MRN: [id number] Date of Birth: 1928/10/25  Today's Date: 11/10/2020 OT Individual Time: 3664-4034 OT Individual Time Calculation (min): 54 min    Short Term Goals: Week 1:  OT Short Term Goal 1 (Week 1): Pt will don pants with CGA OT Short Term Goal 2 (Week 1): Pt will transfer to toilet with CGA OT Short Term Goal 3 (Week 1): Pt will demonstrate improved orientation with use of external cues if necessary OT Short Term Goal 4 (Week 1): Pt will require no more than min cueing for emergent awareness  Skilled Therapeutic Interventions/Progress Updates:    Pt greeted at time of session supine in bed resting agreeable to OT session. Supine > sit CGA from flat bed with extended time and hand rail. Sit > stand and stand pivot to wheelchair Min A with decreased coordination/managing LEs noted, cues to fully turn prior to sitting. Set up at sink for UB/LB bathing (declined BLEs) but able to wash CGA overall in standing for buttocks and periarea. Pt needing to urinate urgently when standing, unable to hold for therapist to get urinal and used brief with no spillage, able to wash periarea and buttocks in standing as well. UB dress Min A, donned new brief Mod/Max A. Sit <> stands Min A throughout session. Oral hygiene CGA in standing. Note pt does have mild LOB in standing at times, able to correct by holding to sink surface. Cues for processing and problem solving ADL tasks throughout session with Mod cues. Pt up in wheelchair with alarm on call bell in reach. Also placed sign in room to ask staff to make sure pt has dentures in when eating as he did not have them this am. Explained call button to pt and how to call for NT/RN.   Therapy Documentation Precautions:  Precautions Precautions: Fall Precaution Comments: HOH Restrictions Weight Bearing Restrictions: No    Therapy/Group: Individual Therapy  Viona Gilmore 11/10/2020, 7:14 AM

## 2020-11-10 NOTE — Progress Notes (Signed)
PROGRESS NOTE   Subjective/Complaints: Complains of some right upper back pain- discussed trying a heating pad today to relax the muscles and he is agreeable- ordered May d/c IV May d/c lovenox- ambulated 160 feet this morning with therapy!  ROS: + right sided upper back pain when ambulating with RW.  Objective:   No results found. Recent Labs    11/08/20 0429 11/09/20 0727  WBC 8.8 7.9  HGB 11.2* 10.8*  HCT 33.3* 33.2*  PLT 183 209   Recent Labs    11/08/20 0429 11/09/20 0727  NA 132* 135  K 3.9 4.1  CL 99 102  CO2 24 26  GLUCOSE 200* 153*  BUN 40* 37*  CREATININE 1.40* 1.37*  CALCIUM 8.2* 8.5*    Intake/Output Summary (Last 24 hours) at 11/10/2020 0925 Last data filed at 11/10/2020 0738 Gross per 24 hour  Intake 720 ml  Output --  Net 720 ml        Physical Exam: Vital Signs Blood pressure (!) 163/53, pulse (!) 53, temperature 99.4 F (37.4 C), temperature source Oral, resp. rate 17, height 5\' 8"  (1.727 m), weight 75.8 kg, SpO2 94 %. Gen: no distress, normal appearing HEENT: oral mucosa pink and moist, hard of hearing Cardio: Reg rate Chest: normal effort, normal rate of breathing Abd: soft, non-distended Ext: no edema Psych: pleasant, normal affect Cardiovascular:     Comments: Bradycardic- regular rhythm heard;no JVD Pulmonary:     Comments: CTA B/L- no W/R/R- good air movement Abdominal:     Comments: Soft, NT, ND, (+)BS    Musculoskeletal:     Cervical back: Normal range of motion and neck supple.     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  Shuffling gait- R hand held at side- HHA with L side LUE 5-/5 and RUE 4+/5- but just a trace difference between the 2 sides RLE- 4/5 and LLE 4+/5- but again very close, just R side very slightly decreased strength compared to L.   Skin:    Comments: Multiple healed scars on left temple from prior surgeries. Healed abrasions  bilateral shins.  Wasn't able to see backside due to not able to turn to show buttocks. Heels OK B/L R forearm IV- OK  Neurological:     Comments: Decreased hearing with mild delay in processing. he was oriented to self, place, month, age, DOB, Pres. He was able to follow simple one and two step commands.   Somewhat apraxic? Staggering gait Intact to light touch in all 4 extremities  Psychiatric:     Comments: Slowed/delayed responses     Assessment/Plan: 1. Functional deficits which require 3+ hours per day of interdisciplinary therapy in a comprehensive inpatient rehab setting.  Physiatrist is providing close team supervision and 24 hour management of active medical problems listed below.  Physiatrist and rehab team continue to assess barriers to discharge/monitor patient progress toward functional and medical goals  Care Tool:  Bathing    Body parts bathed by patient: Left arm,Chest,Right arm,Abdomen,Front perineal area,Buttocks,Face         Bathing assist Assist Level: Contact Guard/Touching assist (declined LB other than periarea/buttocks today)     Upper Body Dressing/Undressing  Upper body dressing   What is the patient wearing?: Pull over shirt    Upper body assist Assist Level: Minimal Assistance - Patient > 75%    Lower Body Dressing/Undressing Lower body dressing      What is the patient wearing?: Incontinence brief     Lower body assist Assist for lower body dressing: Moderate Assistance - Patient 50 - 74%     Toileting Toileting    Toileting assist Assist for toileting: Moderate Assistance - Patient 50 - 74%     Transfers Chair/bed transfer  Transfers assist     Chair/bed transfer assist level: Minimal Assistance - Patient > 75%     Locomotion Ambulation   Ambulation assist      Assist level: Minimal Assistance - Patient > 75% Assistive device: Walker-rolling Max distance: 30   Walk 10 feet activity   Assist     Assist  level: Minimal Assistance - Patient > 75% Assistive device: Walker-rolling   Walk 50 feet activity   Assist Walk 50 feet with 2 turns activity did not occur: Safety/medical concerns         Walk 150 feet activity   Assist Walk 150 feet activity did not occur: Safety/medical concerns         Walk 10 feet on uneven surface  activity   Assist     Assist level: Minimal Assistance - Patient > 75% Assistive device: Aeronautical engineer Will patient use wheelchair at discharge?: No             Wheelchair 50 feet with 2 turns activity    Assist            Wheelchair 150 feet activity     Assist          Blood pressure (!) 163/53, pulse (!) 53, temperature 99.4 F (37.4 C), temperature source Oral, resp. rate 17, height 5\' 8"  (1.727 m), weight 75.8 kg, SpO2 94 %.    Medical Problem List and Plan: 1.  R>L weakness and impaired gait secondary to HTN emergency and encephalopathy             -patient may  shower             -ELOS/Goals: 16-20 days- supervison  -Continue CIR 2.  Antithrombotics: -DVT/anticoagulation:  Pharmaceutical: d/c Lovenox since ambulating >160 feet             -antiplatelet therapy: N/A 3. Right upper back myofascial pain: tylenol prn. Add heating pad.  4. Mood: LCSW to follow for evaluation and support when appropriate             -antipsychotic agents: N/A 5. Neuropsych: This patient may be intermittently capable of making decisions on his own behalf. 6. Skin/Wound Care: Routine pressure-relief measures. May d/c IV 7. Fluids/Electrolytes/Nutrition: Monitor intake/output.  Offer supplements as needed Po intake. 8.  Hypertension: Monitor blood pressures 3 times daily.               --Continue hydralazine 25 mg 3 times daily,             --Monitor renal function with ACE back on board. 9.  Acute renal failure: Improved with IV fluids for hydration. BUN/SCr- 41/1.79-->40/1.40.              --Felt to be  due to ACE--Cozaar resumed on 04/12 as serum creatinine improving.             --May  see some elevation due to IV dye for MRI with contrast 04/12 10.  Delirium: Continue Seroquel at bedtime.  Anticipate worsening with change in environment.             --Will order sleep-wake chart.  11.  T2DM: Monitor blood sugars ac/hs.              --Change Ensure to Ensure max WITH meals.             --Continue Lantus 30 units daily with SSI for elevated BS---titrate as indicated. 12.  B12 deficiency: Continue IM supplementation weekly. 13. Hyponatremia: Recheck in am.     LOS: 2 days A FACE TO FACE EVALUATION WAS PERFORMED  Caleb Dougherty 11/10/2020, 9:25 AM

## 2020-11-10 NOTE — Progress Notes (Signed)
Physical Therapy Session Note  Patient Details  Name: Caleb Dougherty MRN: [id number] Date of Birth: 04-13-1929  Today's Date: 11/10/2020 PT Individual Time: 1000-1053 PT Individual Time Calculation (min): 53 min   Short Term Goals: Week 1:  PT Short Term Goal 1 (Week 1): Patient will completed bed mobility with CGA consistently PT Short Term Goal 2 (Week 1): Patient will transfer bed <> wc with LRAD and CGA consistently PT Short Term Goal 3 (Week 1): Patient will ambulate >68ft with LRAD and CGA  Skilled Therapeutic Interventions/Progress Updates:   Received pt sitting in recliner, pt agreeable to therapy, and reported 5/10 neck pain. RN notified and administered medication during session. Session with focus on functional mobility/transfers, generalized strengthening, dynamic standing balance/coordination, gait training, toileting, and improved activity tolerance. Sit<>stand with RW and mod A from low surfaces throughout session and ambulated 78ft with RW and min A to WC. Pt then ambulated additional 140ft with RW and min A. Pt demonstrated step to gait pattern, narrow BOS, decreased bilateral foot clearance, and flexed trunk/downward gaze and reported increased R shoulder pain (provided pt with moist heat pack at end of session until k-pad arrives). Pt performed lateral side stepping at rail x 30ft with BUE support and CGA/min A for balance with cues to point toes towards wall for hip abductor strengthening. Pt performed the following exercises standing with BUE support with CGA and cues for technique: -heel raises 2x10 -hip abduction x10 bilaterally  -mini squats 2x10  -seated LAQ 2x10 bilaterally  -seated hip flexion 2x10 bilaterally  -seated hip adduction pillow squeezes 2x10 Pt reported sudden urge to urinate and ambulated 48ft with RW and min A into bathroom. Pt able to manage clothing with CGA and with mild leakage but able to urinate while sitting on regular height commode. Removed dirty  brief and donned clean one with max A. Sit<>stand from regular height commode with heavy mod A and required max A to pull brief/pants over hips. Pt ambulated to sink and washed hands with CGA then returned to recliner. Concluded session with pt sitting in recliner, needs within reach, and seatbelt alarm on.   Therapy Documentation Precautions:  Precautions Precautions: Fall Precaution Comments: HOH Restrictions Weight Bearing Restrictions: No  Therapy/Group: Individual Therapy Alfonse Alpers PT, DPT   11/10/2020, 7:21 AM

## 2020-11-10 NOTE — Progress Notes (Signed)
Physical Therapy Session Note  Patient Details  Name: Caleb Dougherty MRN: [id number] Date of Birth: [birth year]  Today's Date: 11/10/2020 PT Individual Time: 0830-0900 PT Individual Time Calculation (min): 30 min   Short Term Goals: Week 1:  PT Short Term Goal 1 (Week 1): Patient will completed bed mobility with CGA consistently PT Short Term Goal 2 (Week 1): Patient will transfer bed <> wc with LRAD and CGA consistently PT Short Term Goal 3 (Week 1): Patient will ambulate >55ft with LRAD and CGA  Skilled Therapeutic Interventions/Progress Updates:    Pt received seated in w/c in room, agreeable to PT session. Pt reports some pain in his neck initially, premedicated prior to start of therapy session. Sit to stand with min A to RW from w/c. Ambulation x 160 ft with RW and min A, ataxic gait, flexed trunk posture, decreased B step length, and decreased heel strike B during gait. Pt reports he does still feel "wobbly" on his feet. Sidesteps L/R x 10 ft with BUE support and CGA for balance, cues for upright posture and proper LE alignment. Sit to stand with max A from low, pliable surface of couch in room. Pt also reports onset of R shoulder pain with mobility. With palpation determined to be muscular pain in R upper trap, MD to order kpad for pain management. Pt left seated in recliner in room with needs in reach, quick release belt and chair alarm in place.   Therapy Documentation Precautions:  Precautions Precautions: Fall Precaution Comments: HOH Restrictions Weight Bearing Restrictions: No   Therapy/Group: Individual Therapy   Excell Seltzer, PT, DPT, CSRS  11/10/2020, 12:09 PM

## 2020-11-10 NOTE — Progress Notes (Signed)
Patient ID: Caleb Dougherty, male   DOB: March 23, 1929, 87 y.o.   MRN: [id number]  Met with patient in room, explained role, and provided him with educational handouts concerning diet, medications, and diagnosis. He mentioned how hard he was working with therapy and that the staff was very nice to him, At this point he had no questions, or concerns. Will continue to monitor his progress.  Dorthula Nettles, RN3, BSN, CBIS, [place], [facility], Inpatient Rehabilitation Office [phone] Cell [phone]

## 2020-11-11 LAB — GLUCOSE, CAPILLARY
Glucose-Capillary: 80 mg/dL (ref 70–99)
Glucose-Capillary: 179 mg/dL — ABNORMAL HIGH (ref 70–99)
Glucose-Capillary: 384 mg/dL — ABNORMAL HIGH (ref 70–99)
Glucose-Capillary: 196 mg/dL — ABNORMAL HIGH (ref 70–99)

## 2020-11-11 LAB — METHYLMALONIC ACID, SERUM: Methylmalonic Acid, Quantitative: 207 nmol/L (ref 0–378)

## 2020-11-11 MED ORDER — LOSARTAN POTASSIUM 50 MG PO TABS
50.0000 mg | ORAL_TABLET | Freq: Every day | ORAL | Status: DC
  Administered 2020-11-12 – 2020-11-15 (×4): 50 mg via ORAL
  Filled 2020-11-11 (×4): qty 1

## 2020-11-11 MED ORDER — INSULIN GLARGINE 100 UNIT/ML ~~LOC~~ SOLN
31.0000 [IU] | Freq: Every morning | SUBCUTANEOUS | Status: DC
  Administered 2020-11-12: 31 [IU] via SUBCUTANEOUS
  Filled 2020-11-11 (×2): qty 0.31

## 2020-11-11 MED ORDER — AMLODIPINE BESYLATE 2.5 MG PO TABS
2.5000 mg | ORAL_TABLET | Freq: Every day | ORAL | Status: DC
  Administered 2020-11-12: 2.5 mg via ORAL
  Filled 2020-11-11: qty 1

## 2020-11-11 MED ORDER — LOSARTAN POTASSIUM 50 MG PO TABS: 75.0000 mg | ORAL_TABLET | Freq: Every day | ORAL | Status: DC

## 2020-11-11 NOTE — IPOC Note (Signed)
Overall Plan of Care [facility]) Patient Details Name: Caleb Dougherty MRN: [id number] DOB: 07-26-1929  Admitting Diagnosis: Encephalopathy  Hospital Problems: Principal Problem:   Encephalopathy Active Problems:   Hypertensive emergency     Functional Problem List: Nursing Behavior,Nutrition,Bladder,Bowel,Perception,Safety,Endurance,Sensory,Medication Management,Skin Integrity  PT Balance,Behavior,Endurance,Motor,Nutrition,Pain,Perception,Safety,Sensory,Skin Integrity  OT Balance,Cognition,Safety,Endurance,Motor,Skin Integrity  SLP Cognition,Linguistic  TR         Basic ADL's: OT Bathing,Dressing,Toileting     Advanced  ADL's: OT       Transfers: PT Bed Mobility,Bed to Chair,Car,Furniture  OT Toilet,Tub/Shower     Locomotion: PT Ambulation,Stairs     Additional Impairments: OT None  SLP Social Cognition   Problem Solving,Memory,Awareness  TR      Anticipated Outcomes Item Anticipated Outcome  Self Feeding no goal set  Swallowing      Basic self-care  set up assist  Toileting  supervision   Bathroom Transfers supervision  Bowel/Bladder  Manage bowel and bladder with mod I assist  Transfers  spv  Locomotion  spv  Communication     Cognition  min A  Pain  less than 3  Safety/Judgment  Maintain safety with cues/reminders   Therapy Plan: PT Intensity: Minimum of 1-2 x/day ,45 to 90 minutes PT Frequency: 5 out of 7 days PT Duration Estimated Length of Stay: 10-12 days OT Intensity: Minimum of 1-2 x/day, 45 to 90 minutes OT Frequency: 5 out of 7 days OT Duration/Estimated Length of Stay: 10-12 days SLP Intensity: Minumum of 1-2 x/day, 30 to 90 minutes SLP Frequency: 3 to 5 out of 7 days SLP Duration/Estimated Length of Stay: 14 days   Due to the current state of emergency, patients may not be receiving their 3-hours of Medicare-mandated therapy.   Team Interventions: Nursing Interventions Patient/Family Education,Bladder Management,Bowel  Management,Disease Management/Prevention,Discharge Planning  PT interventions Ambulation/gait training,DME/adaptive equipment instruction,Psychosocial support,UE/LE Strength taining/ROM,Balance/vestibular training,Functional electrical stimulation,Skin care/wound management,UE/LE Coordination activities,Cognitive remediation/compensation,Functional mobility training,Splinting/orthotics,Visual/perceptual remediation/compensation,Community reintegration,Neuromuscular re-education,Stair training,Wheelchair propulsion/positioning,Discharge planning,Pain management,Therapeutic Activities,Disease management/prevention,Patient/family education,Therapeutic Exercise  OT Interventions Balance/vestibular training,Patient/family education,Self Care/advanced ADL retraining,Therapeutic Exercise,UE/LE Strength taining/ROM,Therapeutic Activities,Skin care/wound managment,Psychosocial support,Functional mobility training,DME/adaptive equipment instruction,Discharge planning,Cognitive remediation/compensation,Community reintegration  SLP Interventions Cognitive remediation/compensation,Functional tasks,Therapeutic Activities,Patient/family education  TR Interventions    SW/CM Interventions Discharge Planning,Psychosocial Support,Patient/Family Education   Barriers to Discharge MD  Medical stability  Nursing Decreased caregiver support 1 level home with ramped entrance; caregiver for wife with dementia  PT Decreased caregiver support,Incontinence,Lack of/limited family support caregiver to wife with dementia  OT Decreased caregiver support Pt was the caregiver for his wife, they will both require assistance  SLP      SW Decreased caregiver support Pt was the caregiver for his wife prior to admission   Team Discharge Planning: Destination: PT-Home ,OT- Home , SLP-Home Projected Follow-up: PT-Home health PT, OT-  Home health OT, SLP-Home Health SLP,Outpatient SLP Projected Equipment Needs: PT-To be determined, OT-  To be determined, SLP-None recommended by SLP Equipment Details: PT- , OT-  Patient/family involved in discharge planning: PT- Patient,  OT-Patient, SLP-Patient,Family member/caregiver  MD ELOS: 16-20 days Medical Rehab Prognosis:  Excellent Assessment: Mr. Caleb Dougherty is an 87 year old man who is admitted to CIR with R>L weakness and impaired gaitsecondary to HTN emergency and encephalopathy. Active medical issues include hyponatremia, B12 deficiency, type 2 DM, delirium, acute renal failure and HTN for which labs are being checked, vitals are being monitored, and medications have been adjusted.    See Team Conference Notes for weekly updates to the plan of care

## 2020-11-11 NOTE — Progress Notes (Signed)
Speech Language Pathology Daily Session Note  Patient Details  Name: Caleb Dougherty MRN: [id number] Date of Birth: 08-15-1928  Today's Date: 11/11/2020 SLP Individual Time: 1445-1530 SLP Individual Time Calculation (min): 45 min  Short Term Goals: Week 1: SLP Short Term Goal 1 (Week 1): Pt will utilize external memory aids to recall new, daily information with Min A verbal and question cues. SLP Short Term Goal 2 (Week 1): Pt will orient to date and medical situation with use of calendar with mod A verbal and visual cues. SLP Short Term Goal 3 (Week 1): Pt will complete basic problem solving tasks with Mod A verbal and visual cues. SLP Short Term Goal 4 (Week 1): Pt will demonstrate intellectual awareness and identify 3 physical limitations with Mod A verbal and visual cues.  Skilled Therapeutic Interventions:   Patient seen for skilled ST session focusing on cognitive function goals. He was oriented to day of week, year and month but not date and was only able to identify holiday (Easter) occurring this weekend with modA cues. Patient unable to verbalize any specific areas of impairment and would simply say that he was getting better. He was only able to demonstrate very general recall of PT and OT sessions earlier today and this was with SLP providing context cues. Patient got tearful when telling SLP of how he was laughed at in school in 1st grade because he read a word incorrectly. He also became tearful and upset when talking about having a difficult life growing up with an alcoholic father. When attempting task requiring some reading/identification, he did report that he needed his glasses and that he would ask his son to bring them next time. Patient continues to benefit from skilled SLP intervention to maximize cognitive abilities prior to discharge.  Pain Pain Assessment Pain Scale: 0-10 Pain Score: 0-No pain  Therapy/Group: Individual Therapy  Sonia Baller, MA, CCC-SLP Speech  Therapy

## 2020-11-11 NOTE — Progress Notes (Signed)
Physical Therapy Session Note  Patient Details  Name: Caleb Dougherty MRN: [id number] Date of Birth: [birth year]  Today's Date: 11/11/2020 PT Individual Time: 1015-1110 PT Individual Time Calculation (min): 55 min   Short Term Goals: Week 1:  PT Short Term Goal 1 (Week 1): Patient will completed bed mobility with CGA consistently PT Short Term Goal 2 (Week 1): Patient will transfer bed <> wc with LRAD and CGA consistently PT Short Term Goal 3 (Week 1): Patient will ambulate >82ft with LRAD and CGA  Skilled Therapeutic Interventions/Progress Updates:   Received pt ambulating to bathroom with NT, PT took over with care. Pt agreeable to therapy and denied any pain during session. Session with focus on toileting, functional mobility/transfers, generalized strengthening, dynamic standing balance/coordination, gait training, NMR, and improved activity tolerance. Pt able to void and required heavy CGA for clothing management while standing. Pt ambulated to sink and stood and washed hands with CGA. Pt transported to 4W dayroom in [facility] total A for time management purposes and ambulated 150ft x 1 and 66ft x 1 with RW and CGA/min A. Pt demonstrates mild ataxia and unsteadiness with narrow BOS, flexed trunk and downward gaze, and decreased bilateral foot clearance. Pt reported his "knees felt stiff" when walking but denied any pain. Worked on dynamic standing balance playing cornhole without AD and min A for balance for 2 minutes x 2 trials with no LOB noted. Pt performed alternating toe taps to 3in step with min/mod handheld assist 3x15 reps. Pt with decreased hip flexion ROM and mild posterior LOB requiring mod A to correct. Stand<>pivot mat WC with RW and min A and transported back to room in [facility] total A. Pt ambulated 26ft with RW and CGA to recliner and became tearful at end of session when talking about his family; provided emotional support. Concluded session with pt sitting in recliner, needs within reach, and  seatbelt alarm on. Provided pt with fresh drink.   Therapy Documentation Precautions:  Precautions Precautions: Fall Precaution Comments: HOH Restrictions Weight Bearing Restrictions: No  Therapy/Group: Individual Therapy Alfonse Alpers PT, DPT   11/11/2020, 7:26 AM

## 2020-11-11 NOTE — Progress Notes (Signed)
PROGRESS NOTE   Subjective/Complaints: Son at bedside He and patient are very happy with progress.  D/c IV today Patient is getting bored so family will bring in puzzles and I have consulted rec therapy.   ROS: + right sided upper back pain when ambulating with RW. Denies other sources of pain  Objective:   No results found. Recent Labs    11/09/20 0727  WBC 7.9  HGB 10.8*  HCT 33.2*  PLT 209   Recent Labs    11/09/20 0727  NA 135  K 4.1  CL 102  CO2 26  GLUCOSE 153*  BUN 37*  CREATININE 1.37*  CALCIUM 8.5*    Intake/Output Summary (Last 24 hours) at 11/11/2020 1127 Last data filed at 11/11/2020 0700 Gross per 24 hour  Intake 480 ml  Output --  Net 480 ml        Physical Exam: Vital Signs Blood pressure (!) 156/75, pulse 82, temperature 98.5 F (36.9 C), temperature source Oral, resp. rate 16, height 5\' 8"  (1.727 m), weight 75.8 kg, SpO2 98 %. Gen: no distress, normal appearing HEENT: oral mucosa pink and moist, NCAT Cardio: Reg rate Chest: normal effort, normal rate of breathing Abd: soft, non-distended Ext: no edema Psych: pleasant, normal affect Musculoskeletal:     Cervical back: Normal range of motion and neck supple.     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  Shuffling gait- R hand held at side- HHA with L side LUE 5-/5 and RUE 4+/5- but just a trace difference between the 2 sides RLE- 4/5 and LLE 4+/5- but again very close, just R side very slightly decreased strength compared to L.   Skin:    Comments: Multiple healed scars on left temple from prior surgeries. Healed abrasions bilateral shins.  Wasn't able to see backside due to not able to turn to show buttocks. Heels OK B/L R forearm IV- OK  Neurological:     Comments: Decreased hearing with mild delay in processing. he was oriented to self, place, month, age, DOB, Pres. He was able to follow simple one and two  step commands.   Somewhat apraxic? Staggering gait Intact to light touch in all 4 extremities  Psychiatric:     Comments: Slowed/delayed responses   Assessment/Plan: 1. Functional deficits which require 3+ hours per day of interdisciplinary therapy in a comprehensive inpatient rehab setting.  Physiatrist is providing close team supervision and 24 hour management of active medical problems listed below.  Physiatrist and rehab team continue to assess barriers to discharge/monitor patient progress toward functional and medical goals  Care Tool:  Bathing    Body parts bathed by patient: Right arm,Left arm,Chest,Abdomen,Front perineal area,Buttocks,Right upper leg,Left upper leg,Right lower leg,Left lower leg,Face         Bathing assist Assist Level: Contact Guard/Touching assist     Upper Body Dressing/Undressing Upper body dressing   What is the patient wearing?: Button up shirt    Upper body assist Assist Level: Minimal Assistance - Patient > 75%    Lower Body Dressing/Undressing Lower body dressing      What is the patient wearing?: Incontinence brief,Pants     Lower body  assist Assist for lower body dressing: Minimal Assistance - Patient > 75%     Toileting Toileting    Toileting assist Assist for toileting: Moderate Assistance - Patient 50 - 74%     Transfers Chair/bed transfer  Transfers assist     Chair/bed transfer assist level: Minimal Assistance - Patient > 75%     Locomotion Ambulation   Ambulation assist      Assist level: Minimal Assistance - Patient > 75% Assistive device: Walker-rolling Max distance: 165ft   Walk 10 feet activity   Assist     Assist level: Minimal Assistance - Patient > 75% Assistive device: Walker-rolling   Walk 50 feet activity   Assist Walk 50 feet with 2 turns activity did not occur: Safety/medical concerns  Assist level: Minimal Assistance - Patient > 75% Assistive device: Walker-rolling    Walk  150 feet activity   Assist Walk 150 feet activity did not occur: Safety/medical concerns  Assist level: Minimal Assistance - Patient > 75% Assistive device: Walker-rolling    Walk 10 feet on uneven surface  activity   Assist     Assist level: Minimal Assistance - Patient > 75% Assistive device: Aeronautical engineer Will patient use wheelchair at discharge?: No             Wheelchair 50 feet with 2 turns activity    Assist            Wheelchair 150 feet activity     Assist          Blood pressure (!) 156/75, pulse 82, temperature 98.5 F (36.9 C), temperature source Oral, resp. rate 16, height 5\' 8"  (1.727 m), weight 75.8 kg, SpO2 98 %.    Medical Problem List and Plan: 1.  R>L weakness and impaired gait secondary to HTN emergency and encephalopathy             -patient may  shower             -ELOS/Goals: 16-20 days- supervison  Continue CIR 2.  Antithrombotics: -DVT/anticoagulation:  Pharmaceutical: d/c Lovenox since ambulating >160 feet             -antiplatelet therapy: N/A 3. Right upper back myofascial pain: tylenol prn. Add heating pad.  4. Mood: LCSW to follow for evaluation and support when appropriate             -antipsychotic agents: N/A 5. Neuropsych: This patient may be intermittently capable of making decisions on his own behalf. 6. Skin/Wound Care: Routine pressure-relief measures. May d/c IV 7. Fluids/Electrolytes/Nutrition: Monitor intake/output.  Offer supplements as needed Po intake. 8.  Hypertension: Monitor blood pressures 3 times daily.               --Continue hydralazine 25 mg 3 times daily,             --Monitor renal function with ACE back on board.  -add amlodipine 2.5mg  9.  Acute renal failure: Improved with IV fluids for hydration. BUN/SCr- 41/1.79-->40/1.40.              --Felt to be due to ACE--Cozaar resumed on 04/12 as serum creatinine improving.             --May see some elevation due to  IV dye for MRI with contrast 04/12 10.  Delirium: d/c seroquel 11.  T2DM: Monitor blood sugars ac/hs.              --Change  Ensure to Ensure max WITH meals.             --Increase Lantus to 31 units daily with SSI for elevated BS---titrate as indicated. 12.  B12 deficiency: Continue IM supplementation weekly. 13. Hyponatremia: Recheck in am.     LOS: 3 days A FACE TO FACE EVALUATION WAS PERFORMED  Martha Clan P Jsean Taussig 11/11/2020, 11:27 AM

## 2020-11-11 NOTE — Progress Notes (Signed)
Occupational Therapy Session Note  Patient Details  Name: Caleb Dougherty MRN: [id number] Date of Birth: 04-24-1929  Today's Date: 11/11/2020 OT Individual Time: 9767-3419 OT Individual Time Calculation (min): 55 min    Short Term Goals: Week 1:  OT Short Term Goal 1 (Week 1): Pt will don pants with CGA OT Short Term Goal 2 (Week 1): Pt will transfer to toilet with CGA OT Short Term Goal 3 (Week 1): Pt will demonstrate improved orientation with use of external cues if necessary OT Short Term Goal 4 (Week 1): Pt will require no more than min cueing for emergent awareness  Skilled Therapeutic Interventions/Progress Updates:    Pt eating breakfast in bed upon arrival and agreeable to getting OOB for bathing/dressing. Supine>sit EOB with supervision. Sit<>stand and amb with RW to bathroom with CGA. Bathing with sit<>stand from TTB in shower. Pt requires CGA with standing. Pt stepped over shower ledge and amb with RW to recliner to complete dressing tasks. Pt required CGA. Dressing with sit<>stand from recliner. CGA for sit<>stand and standing balance. Pt required assistance threading BLE into pants and pullups. Pt also required assistance donning socks. Pt states his wife usually helped him with socks PTA. Pt required assistance with fastening button-up shirt. Pt became tearful when talking about his wife. 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 Pt remained in reclainer with all needs within reach and belt alarm activated.  Therapy Documentation Precautions:  Precautions Precautions: Fall Precaution Comments: HOH Restrictions Weight Bearing Restrictions: No  Pain:  Pt c/o Rt shoulder pain (unrated); kpad applied   Therapy/Group: Individual Therapy  Leroy Libman 11/11/2020, 8:02 AM

## 2020-11-12 LAB — GLUCOSE, CAPILLARY
Glucose-Capillary: 259 mg/dL — ABNORMAL HIGH (ref 70–99)
Glucose-Capillary: 348 mg/dL — ABNORMAL HIGH (ref 70–99)
Glucose-Capillary: 179 mg/dL — ABNORMAL HIGH (ref 70–99)
Glucose-Capillary: 351 mg/dL — ABNORMAL HIGH (ref 70–99)

## 2020-11-12 NOTE — Progress Notes (Signed)
PROGRESS NOTE   Subjective/Complaints: Working on a difficult puzzle! He is so pleasant, has no complaints  ROS: + right sided upper back pain when ambulating with RW. Denies other sources of pain, denies constipation  Objective:   No results found. No results for input(s): WBC, HGB, HCT, PLT in the last 72 hours. No results for input(s): NA, K, CL, CO2, GLUCOSE, BUN, CREATININE, CALCIUM in the last 72 hours.  Intake/Output Summary (Last 24 hours) at 11/12/2020 1135 Last data filed at 11/12/2020 0932 Gross per 24 hour  Intake 880 ml  Output 1250 ml  Net -370 ml        Physical Exam: Vital Signs Blood pressure (!) 167/57, pulse (!) 47, temperature 98.7 F (37.1 C), temperature source Oral, resp. rate 14, height 5\' 8"  (1.727 m), weight 75.8 kg, SpO2 94 %. Gen: no distress, normal appearing HEENT: oral mucosa pink and moist, NCAT Cardio: Bradycardia Chest: normal effort, normal rate of breathing Abd: soft, non-distended Ext: no edema Psych: pleasant, normal affect Skin: intact Musculoskeletal:     Cervical back: Normal range of motion and neck supple.     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  Shuffling gait- R hand held at side- HHA with L side LUE 5-/5 and RUE 4+/5- but just a trace difference between the 2 sides RLE- 4/5 and LLE 4+/5- but again very close, just R side very slightly decreased strength compared to L.   Skin:    Comments: Multiple healed scars on left temple from prior surgeries. Healed abrasions bilateral shins.  Wasn't able to see backside due to not able to turn to show buttocks. Heels OK B/L R forearm IV- OK  Neurological:     Comments: Decreased hearing with mild delay in processing. he was oriented to self, place, month, age, DOB, Pres. He was able to follow simple one and two step commands.   Somewhat apraxic? Staggering gait Intact to light touch in all 4 extremities   Psychiatric:     Comments: Slowed/delayed responses   Assessment/Plan: 1. Functional deficits which require 3+ hours per day of interdisciplinary therapy in a comprehensive inpatient rehab setting.  Physiatrist is providing close team supervision and 24 hour management of active medical problems listed below.  Physiatrist and rehab team continue to assess barriers to discharge/monitor patient progress toward functional and medical goals  Care Tool:  Bathing    Body parts bathed by patient: Right arm,Left arm,Chest,Abdomen,Front perineal area,Buttocks,Right upper leg,Left upper leg,Right lower leg,Left lower leg,Face         Bathing assist Assist Level: Contact Guard/Touching assist     Upper Body Dressing/Undressing Upper body dressing   What is the patient wearing?: Pull over shirt    Upper body assist Assist Level: Supervision/Verbal cueing    Lower Body Dressing/Undressing Lower body dressing      What is the patient wearing?: Incontinence brief,Pants     Lower body assist Assist for lower body dressing: Moderate Assistance - Patient 50 - 74%     Toileting Toileting    Toileting assist Assist for toileting: Minimal Assistance - Patient > 75%     Transfers Chair/bed transfer  Transfers assist  Chair/bed transfer assist level: Minimal Assistance - Patient > 75%     Locomotion Ambulation   Ambulation assist      Assist level: Minimal Assistance - Patient > 75% Assistive device: Walker-rolling Max distance: 128ft   Walk 10 feet activity   Assist     Assist level: Minimal Assistance - Patient > 75% Assistive device: Walker-rolling   Walk 50 feet activity   Assist Walk 50 feet with 2 turns activity did not occur: Safety/medical concerns  Assist level: Minimal Assistance - Patient > 75% Assistive device: Walker-rolling    Walk 150 feet activity   Assist Walk 150 feet activity did not occur: Safety/medical concerns  Assist level:  Minimal Assistance - Patient > 75% Assistive device: Walker-rolling    Walk 10 feet on uneven surface  activity   Assist     Assist level: Minimal Assistance - Patient > 75% Assistive device: Aeronautical engineer Will patient use wheelchair at discharge?: No             Wheelchair 50 feet with 2 turns activity    Assist            Wheelchair 150 feet activity     Assist          Blood pressure (!) 167/57, pulse (!) 47, temperature 98.7 F (37.1 C), temperature source Oral, resp. rate 14, height 5\' 8"  (1.727 m), weight 75.8 kg, SpO2 94 %.    Medical Problem List and Plan: 1.  R>L weakness and impaired gait secondary to HTN emergency and encephalopathy             -patient may  shower             -ELOS/Goals: 16-20 days- supervison  Continue CIR 2.  Antithrombotics: -DVT/anticoagulation:  Pharmaceutical: d/c Lovenox since ambulating >160 feet             -antiplatelet therapy: N/A 3. Right upper back myofascial pain: tylenol prn. Add heating pad.  4. Mood: LCSW to follow for evaluation and support when appropriate             -antipsychotic agents: N/A 5. Neuropsych: This patient may be intermittently capable of making decisions on his own behalf. 6. Skin/Wound Care: Routine pressure-relief measures. May d/c IV 7. Fluids/Electrolytes/Nutrition: Monitor intake/output.  Offer supplements as needed Po intake. 8.  Hypertension: Monitor blood pressures 3 times daily.               --Continue hydralazine 25 mg 3 times daily,             --Monitor renal function with ACE back on board. 9.  Acute renal failure: Improved with IV fluids for hydration. BUN/SCr- 41/1.79-->40/1.40.              --Felt to be due to ACE--Cozaar resumed on 04/12 as serum creatinine improving.             --May see some elevation due to IV dye for MRI with contrast 04/12 10.  Delirium: d/c seroquel 11.  T2DM: Monitor blood sugars ac/hs.              --CBG  80-384: d/c ensure Max given sucralose.              --Increase Lantus to 31 units daily with SSI for elevated BS---titrate as indicated. 12.  B12 deficiency: Continue IM supplementation weekly. 13. Hyponatremia: Recheck in am.   14. Bradycardia to  47: d/c amlodipine.  15. Overweight BMI 25.42: d/c Ensure max   LOS: 4 days A FACE TO FACE EVALUATION WAS PERFORMED  Martha Clan P Eisley Barber 11/12/2020, 11:35 AM

## 2020-11-12 NOTE — Plan of Care (Signed)
  Problem: RH BOWEL ELIMINATION Goal: RH STG MANAGE BOWEL WITH ASSISTANCE Description: STG Manage Bowel with mod I  Assistance. Outcome: Progressing   Problem: RH BLADDER ELIMINATION Goal: RH STG MANAGE BLADDER WITH ASSISTANCE Description: STG Manage Bladder With  mod I Assistance Outcome: Progressing   Problem: RH SAFETY Goal: RH STG ADHERE TO SAFETY PRECAUTIONS W/ASSISTANCE/DEVICE Description: STG Adhere to Safety Precautions With cues/reminders Assistance/Device. Outcome: Progressing   Problem: RH KNOWLEDGE DEFICIT GENERAL Goal: RH STG INCREASE KNOWLEDGE OF SELF CARE AFTER HOSPITALIZATION Description: Patient and son will be able to manage patient care at discharge using handouts and educational tools independently Outcome: Progressing   Problem: Consults Goal: RH GENERAL PATIENT EDUCATION Description: See Patient Education module for education specifics. Outcome: Progressing

## 2020-11-12 NOTE — Progress Notes (Signed)
Physical Therapy Session Note  Patient Details  Name: Caleb Dougherty MRN: [id number] Date of Birth: 07/29/1929  Today's Date: 11/12/2020 PT Individual Time: 1100-1200 PT Individual Time Calculation (min): 60 min   Short Term Goals: Week 1:  PT Short Term Goal 1 (Week 1): Patient will completed bed mobility with CGA consistently PT Short Term Goal 2 (Week 1): Patient will transfer bed <> wc with LRAD and CGA consistently PT Short Term Goal 3 (Week 1): Patient will ambulate >98ft with LRAD and CGA      Skilled Therapeutic Interventions/Progress Updates:    pt received in recliner and agreeable to therapy. Nursing present for needs. Pt reported he needed to urinate, directed in Sit to stand from recliner to Rolling walker SBA and directed in gait to restroom CGA for toilet transfer and clothing management. Pt did urinate slightly on the floor, supervision for doffing soiled socks and min A for donning clean socks in sitting. Pt directed in gait to sink with Rolling walker SBA trunk flexed slightly. Pt directed in hand hygiene supervision. Pt required brief rest break then directed in 2x200' gait training with Rolling walker CGA with VC for trunk extension and improved head carriage. Pt required seated rest break between these 2/2 fatigue. Pt directed in multiple Sit to stand throughout session all SBA with light extra time and to Rolling walker. Pt directed in seated 2x10 reciprocal shoulder and hip flexion for improved reciprocal gait patterns with improved arm swing and improved duel tasks. Pt directed in 8 mins of standing without walker with one UE support intermittently to work puzzle in room with VC for reaching for pieces inside and outside BOS for improved balance righting and tolerance standing. Pt then returned to recliner at end of session. Pt left sitting in recliner, All needs in reach and in good condition. Call light in hand.  And alarm belt set. Pt denied pain throughout  Therapy  Documentation Precautions:  Precautions Precautions: Fall Precaution Comments: HOH Restrictions Weight Bearing Restrictions: No General:   Vital Signs:  Pain: Pain Assessment Pain Scale: 0-10 Pain Score: 0-No pain Mobility:   Locomotion :    Trunk/Postural Assessment :    Balance:   Exercises:   Other Treatments:      Therapy/Group: Individual Therapy  Junie Panning 11/12/2020, 12:09 PM

## 2020-11-12 NOTE — Progress Notes (Signed)
Speech Language Pathology Daily Session Note  Patient Details  Name: Caleb Dougherty MRN: [id number] Date of Birth: [birth year]  Today's Date: 11/12/2020 SLP Individual Time: 1032-1100 SLP Individual Time Calculation (min): 28 min  Short Term Goals: Week 1: SLP Short Term Goal 1 (Week 1): Pt will utilize external memory aids to recall new, daily information with Min A verbal and question cues. SLP Short Term Goal 2 (Week 1): Pt will orient to date and medical situation with use of calendar with mod A verbal and visual cues. SLP Short Term Goal 3 (Week 1): Pt will complete basic problem solving tasks with Mod A verbal and visual cues. SLP Short Term Goal 4 (Week 1): Pt will demonstrate intellectual awareness and identify 3 physical limitations with Mod A verbal and visual cues.  Skilled Therapeutic Interventions: Pt seen for skilled ST with focus on cognitive goals, pt sitting upright in recliner completing puzzle upon arrival. Pt oriented to DOW, MOY, year, upcoming holiday but not date or rationale behind hospital stay. Pt continues to demonstrate decreased insight into current cognitive and physical impairment, mostly repeating he doesn't know why he is here. SLP facilitating mildly complex problem solving task related to home environment (pt takes care of wife with dementia) by providing mod A verbal cues to increase safety and thoroughness of responses. Pt lives across from son, will likely need more support/assist at discharge 2' cognitive impairments. Pt left in chair with alarm set, RN present. Cont ST POC.   Pain Pain Assessment Pain Scale: 0-10 Pain Score: 0-No pain  Therapy/Group: Individual Therapy  Dewaine Conger 11/12/2020, 10:59 AM

## 2020-11-12 NOTE — Progress Notes (Signed)
Occupational Therapy Session Note  Patient Details  Name: Caleb Dougherty MRN: [id number] Date of Birth: [birth year]  Today's Date: 11/12/2020 OT Individual Time: 2194-7125 (session 2) and 730-825 (session 1) OT Individual Time Calculation (min): 27 min (session 2) and 55 min (session 1)   Short Term Goals: Week 1:  OT Short Term Goal 1 (Week 1): Pt will don pants with CGA OT Short Term Goal 2 (Week 1): Pt will transfer to toilet with CGA OT Short Term Goal 3 (Week 1): Pt will demonstrate improved orientation with use of external cues if necessary OT Short Term Goal 4 (Week 1): Pt will require no more than min cueing for emergent awareness   Skilled Therapeutic Interventions/Progress Updates:    Session 1: Pt greeted at time of sessino supine in bed resting agreeable to OT session. No pain. Supine > sit from flat bed to simulate home with Supervision. Sit > stand and stand pivot with RW to wheelchair with Min/CGA. Set up at sink for oral hygiene but pt stating needing to have BM urgently, brought via wheelchair to bathroom and stand pivot > toilet with grab bar CGA/Min. Min A for clothing management. (+) for BM and CGA for hygiene in sitting. Donned pants and new brief Min/Mod A with assist to thread with difficulty FWD weight shifting. Stand pivot back to wheelchair same manner as above. Set up at sink for UB bathe/dress supervision. Oral hygiene in same manner. 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 Pt walked approx 5 feet wheelchair > recliner Min A with RW. Alarm on call bell in reach.   Session 2: Pt greeted at time of session sitting up in recliner agreeable to OT session, no pain despite having k pad in chair with him. Working his puzzle upon entering room, continued to do so in standing with unilateral support on AD for approx 3 minutes before seated rest break. Continued working puzzle seated for bimanual integration, visual  scanning, attending to both L/R sides with conversation throughout task for STM and LTM recall of information. Also positioned table with puzzle at better angle in front of pt to minimize twisting and bending to reach puzzle. Pt up in recliner with alarm on call bell in reach.   Therapy Documentation Precautions:  Precautions Precautions: Fall Precaution Comments: HOH Restrictions Weight Bearing Restrictions: No     Therapy/Group: Individual Therapy  Viona Gilmore 11/12/2020, 7:04 AM

## 2020-11-13 LAB — URINALYSIS, ROUTINE W REFLEX MICROSCOPIC
Bacteria, UA: NONE SEEN
Bilirubin Urine: NEGATIVE
Glucose, UA: 150 mg/dL — AB
Hgb urine dipstick: NEGATIVE
Ketones, ur: NEGATIVE mg/dL
Leukocytes,Ua: NEGATIVE
Nitrite: NEGATIVE
Protein, ur: 100 mg/dL — AB
Specific Gravity, Urine: 1.01 (ref 1.005–1.030)
pH: 6 (ref 5.0–8.0)

## 2020-11-13 LAB — GLUCOSE, CAPILLARY
Glucose-Capillary: 138 mg/dL — ABNORMAL HIGH (ref 70–99)
Glucose-Capillary: 237 mg/dL — ABNORMAL HIGH (ref 70–99)
Glucose-Capillary: 231 mg/dL — ABNORMAL HIGH (ref 70–99)
Glucose-Capillary: 269 mg/dL — ABNORMAL HIGH (ref 70–99)

## 2020-11-13 MED ORDER — TAMSULOSIN HCL 0.4 MG PO CAPS
0.4000 mg | ORAL_CAPSULE | Freq: Every day | ORAL | Status: DC
  Administered 2020-11-13 – 2020-11-16 (×4): 0.4 mg via ORAL
  Filled 2020-11-13 (×4): qty 1

## 2020-11-13 MED ORDER — INSULIN GLARGINE 100 UNIT/ML ~~LOC~~ SOLN
32.0000 [IU] | Freq: Every morning | SUBCUTANEOUS | Status: DC
  Administered 2020-11-13 – 2020-11-17 (×5): 32 [IU] via SUBCUTANEOUS
  Filled 2020-11-13 (×5): qty 0.32

## 2020-11-13 NOTE — Progress Notes (Signed)
PROGRESS NOTE   Subjective/Complaints: Sleeping this morning Appreciate nursing note. Voiding every hour 150-250 cc. Past UA/UC from 4/8 reviewed- had multiple species- will recollect today.  BP is high- will start flomax with supper  ROS: + right sided upper back pain when ambulating with RW. Denies other sources of pain, denies constipation, +urinary frequency, denies dysuria  Objective:   No results found. No results for input(s): WBC, HGB, HCT, PLT in the last 72 hours. No results for input(s): NA, K, CL, CO2, GLUCOSE, BUN, CREATININE, CALCIUM in the last 72 hours.  Intake/Output Summary (Last 24 hours) at 11/13/2020 0900 Last data filed at 11/13/2020 0711 Gross per 24 hour  Intake 1138 ml  Output 2850 ml  Net -1712 ml        Physical Exam: Vital Signs Blood pressure (!) 188/64, pulse 78, temperature 98.2 F (36.8 C), temperature source Oral, resp. rate 16, height 5\' 8"  (1.727 m), weight 75.8 kg, SpO2 95 %. Gen: no distress, normal appearing HEENT: oral mucosa pink and moist, NCAT Cardio: Reg rate Chest: normal effort, normal rate of breathing Abd: soft, non-distended Ext: no edema Psych: pleasant, normal affect Skin: intact Musculoskeletal:     Cervical back: Normal range of motion and neck supple.     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  Shuffling gait- R hand held at side- HHA with L side LUE 5-/5 and RUE 4+/5- but just a trace difference between the 2 sides RLE- 4/5 and LLE 4+/5- but again very close, just R side very slightly decreased strength compared to L.   Skin:    Comments: Multiple healed scars on left temple from prior surgeries. Healed abrasions bilateral shins.  Wasn't able to see backside due to not able to turn to show buttocks. Heels OK B/L R forearm IV- OK  Neurological:     Comments: Decreased hearing with mild delay in processing. he was oriented to self, place,  month, age, DOB, Pres. He was able to follow simple one and two step commands.   Somewhat apraxic? Staggering gait Intact to light touch in all 4 extremities  Psychiatric:     Comments: Slowed/delayed responses   Assessment/Plan: 1. Functional deficits which require 3+ hours per day of interdisciplinary therapy in a comprehensive inpatient rehab setting.  Physiatrist is providing close team supervision and 24 hour management of active medical problems listed below.  Physiatrist and rehab team continue to assess barriers to discharge/monitor patient progress toward functional and medical goals  Care Tool:  Bathing    Body parts bathed by patient: Right arm,Left arm,Chest,Abdomen,Front perineal area,Buttocks,Right upper leg,Left upper leg,Right lower leg,Left lower leg,Face         Bathing assist Assist Level: Contact Guard/Touching assist     Upper Body Dressing/Undressing Upper body dressing   What is the patient wearing?: Pull over shirt    Upper body assist Assist Level: Supervision/Verbal cueing    Lower Body Dressing/Undressing Lower body dressing      What is the patient wearing?: Incontinence brief,Pants     Lower body assist Assist for lower body dressing: Moderate Assistance - Patient 50 - 74%     Toileting Toileting  Toileting assist Assist for toileting: Minimal Assistance - Patient > 75%     Transfers Chair/bed transfer  Transfers assist     Chair/bed transfer assist level: Minimal Assistance - Patient > 75%     Locomotion Ambulation   Ambulation assist      Assist level: Minimal Assistance - Patient > 75% Assistive device: Walker-rolling Max distance: 128ft   Walk 10 feet activity   Assist     Assist level: Minimal Assistance - Patient > 75% Assistive device: Walker-rolling   Walk 50 feet activity   Assist Walk 50 feet with 2 turns activity did not occur: Safety/medical concerns  Assist level: Minimal Assistance -  Patient > 75% Assistive device: Walker-rolling    Walk 150 feet activity   Assist Walk 150 feet activity did not occur: Safety/medical concerns  Assist level: Minimal Assistance - Patient > 75% Assistive device: Walker-rolling    Walk 10 feet on uneven surface  activity   Assist     Assist level: Minimal Assistance - Patient > 75% Assistive device: Aeronautical engineer Will patient use wheelchair at discharge?: No             Wheelchair 50 feet with 2 turns activity    Assist            Wheelchair 150 feet activity     Assist          Blood pressure (!) 188/64, pulse 78, temperature 98.2 F (36.8 C), temperature source Oral, resp. rate 16, height 5\' 8"  (1.727 m), weight 75.8 kg, SpO2 95 %.    Medical Problem List and Plan: 1.  R>L weakness and impaired gait secondary to HTN emergency and encephalopathy             -patient may  shower             -ELOS/Goals: 16-20 days- supervison  Continue CIR 2.  Antithrombotics: -DVT/anticoagulation:  Pharmaceutical: d/c Lovenox since ambulating >160 feet             -antiplatelet therapy: N/A 3. Right upper back myofascial pain: tylenol prn. Add heating pad.  4. Mood: LCSW to follow for evaluation and support when appropriate             -antipsychotic agents: N/A 5. Neuropsych: This patient may be intermittently capable of making decisions on his own behalf. 6. Skin/Wound Care: Routine pressure-relief measures. May d/c IV 7. Fluids/Electrolytes/Nutrition: Monitor intake/output.  Offer supplements as needed Po intake. 8.  Hypertension: Monitor blood pressures 3 times daily.               --Continue hydralazine 25 mg 3 times daily,             --Monitor renal function with ACE back on board  -add flomax w/ supper 9.  Acute renal failure: Improved with IV fluids for hydration. BUN/SCr- 41/1.79-->40/1.40.              --Felt to be due to ACE--Cozaar resumed on 04/12 as serum  creatinine improving.             --May see some elevation due to IV dye for MRI with contrast 04/12 10.  Delirium: d/c seroquel 11.  T2DM: Monitor blood sugars ac/hs.               d/c ensure Max given sucralose.   --CBG improved to 179-351             --  increase Lantus to 32 units daily with SSI for elevated BS---titrate as indicated. 12.  B12 deficiency: Continue IM supplementation weekly. 13. Hyponatremia: Recheck in am.   14. Bradycardia to 47: d/c amlodipine.  15. Overweight BMI 25.42: d/c Ensure max 16. Urinary frequency: UA/UC from 4/8 reviewed and had multiple species: recollect today. Add flomax at night.    LOS: 5 days A FACE TO FACE EVALUATION WAS PERFORMED  Naftuli Dalsanto P Landon Truax 11/13/2020, 9:00 AM

## 2020-11-13 NOTE — Progress Notes (Signed)
Patient slept off and on last night. Patient voids every hour-amounts between 150-250cc of urine. Patient denies any pain or burning with urination.

## 2020-11-13 NOTE — Plan of Care (Signed)
  Problem: RH BOWEL ELIMINATION Goal: RH STG MANAGE BOWEL WITH ASSISTANCE Description: STG Manage Bowel with mod I  Assistance. Outcome: Progressing   Problem: RH BLADDER ELIMINATION Goal: RH STG MANAGE BLADDER WITH ASSISTANCE Description: STG Manage Bladder With  mod I Assistance Outcome: Progressing   Problem: RH SAFETY Goal: RH STG ADHERE TO SAFETY PRECAUTIONS W/ASSISTANCE/DEVICE Description: STG Adhere to Safety Precautions With cues/reminders Assistance/Device. Outcome: Progressing   Problem: RH KNOWLEDGE DEFICIT GENERAL Goal: RH STG INCREASE KNOWLEDGE OF SELF CARE AFTER HOSPITALIZATION Description: Patient and son will be able to manage patient care at discharge using handouts and educational tools independently Outcome: Progressing   Problem: Consults Goal: RH GENERAL PATIENT EDUCATION Description: See Patient Education module for education specifics. Outcome: Progressing

## 2020-11-14 LAB — CBC
HCT: 33 % — ABNORMAL LOW (ref 39.0–52.0)
Hemoglobin: 10.8 g/dL — ABNORMAL LOW (ref 13.0–17.0)
MCH: 30.6 pg (ref 26.0–34.0)
MCHC: 32.7 g/dL (ref 30.0–36.0)
MCV: 93.5 fL (ref 80.0–100.0)
Platelets: 286 10*3/uL (ref 150–400)
RBC: 3.53 MIL/uL — ABNORMAL LOW (ref 4.22–5.81)
RDW: 13.6 % (ref 11.5–15.5)
WBC: 7.2 10*3/uL (ref 4.0–10.5)
nRBC: 0 % (ref 0.0–0.2)

## 2020-11-14 LAB — BASIC METABOLIC PANEL
Anion gap: 5 (ref 5–15)
BUN: 32 mg/dL — ABNORMAL HIGH (ref 8–23)
CO2: 28 mmol/L (ref 22–32)
Calcium: 9.1 mg/dL (ref 8.9–10.3)
Chloride: 104 mmol/L (ref 98–111)
Creatinine, Ser: 1.21 mg/dL (ref 0.61–1.24)
GFR, Estimated: 57 mL/min — ABNORMAL LOW (ref >60)
Glucose, Bld: 78 mg/dL (ref 70–99)
Potassium: 4.1 mmol/L (ref 3.5–5.1)
Sodium: 137 mmol/L (ref 135–145)

## 2020-11-14 LAB — URINE CULTURE: Culture: NO GROWTH

## 2020-11-14 LAB — GLUCOSE, CAPILLARY
Glucose-Capillary: 75 mg/dL (ref 70–99)
Glucose-Capillary: 205 mg/dL — ABNORMAL HIGH (ref 70–99)
Glucose-Capillary: 271 mg/dL — ABNORMAL HIGH (ref 70–99)
Glucose-Capillary: 136 mg/dL — ABNORMAL HIGH (ref 70–99)

## 2020-11-14 NOTE — Progress Notes (Signed)
Occupational Therapy Session Note  Patient Details  Name: Caleb Dougherty MRN: [id number] Date of Birth: 1929-02-28  Today's Date: 11/14/2020 OT Individual Time: 1500-1530 OT Individual Time Calculation (min): 30 min    Short Term Goals: Week 1:  OT Short Term Goal 1 (Week 1): Pt will don pants with CGA OT Short Term Goal 2 (Week 1): Pt will transfer to toilet with CGA OT Short Term Goal 3 (Week 1): Pt will demonstrate improved orientation with use of external cues if necessary OT Short Term Goal 4 (Week 1): Pt will require no more than min cueing for emergent awareness  Skilled Therapeutic Interventions/Progress Updates:    Patient seated in recliner.  He states that he would like to work on his puzzle.  1000 piece puzzle on table in front of him.  He admits that it is quite overwhelming.  Encouraged to sort and and work on smaller portion at a time.  He tolerates unsupported sitting and is able to pull like colors out and set them aside with min A.   Min A to locate edge pieces and organize table.  He remained seated in recliner at close of session, seat belt alarm set and call bell in reach.    Therapy Documentation Precautions:  Precautions Precautions: Fall Precaution Comments: HOH Restrictions Weight Bearing Restrictions: No   Therapy/Group: Individual Therapy  Caleb Dougherty 11/14/2020, 7:43 AM

## 2020-11-14 NOTE — Progress Notes (Signed)
External catheter used last night for patient comfort. Patient slept most of shift. See sleep chart

## 2020-11-14 NOTE — Progress Notes (Signed)
PROGRESS NOTE   Subjective/Complaints: Complains of some right heel pain- appears to be plantar fasciitis- discussed with Sarah OT to see if he can be provided with plantar fascia stretching exercises Showered today, has no other complaints  ROS: + right sided upper back pain when ambulating with RW. Denies other sources of pain, denies constipation, +urinary frequency, denies dysuria, +right sided heel pain  Objective:   No results found. Recent Labs    11/14/20 0600  WBC 7.2  HGB 10.8*  HCT 33.0*  PLT 286   Recent Labs    11/14/20 0600  NA 137  K 4.1  CL 104  CO2 28  GLUCOSE 78  BUN 32*  CREATININE 1.21  CALCIUM 9.1    Intake/Output Summary (Last 24 hours) at 11/14/2020 1231 Last data filed at 11/14/2020 0746 Gross per 24 hour  Intake 802 ml  Output 975 ml  Net -173 ml        Physical Exam: Vital Signs Blood pressure 108/87, pulse 94, temperature 98.6 F (37 C), resp. rate 20, height 5\' 8"  (1.727 m), weight 75.8 kg, SpO2 95 %. Gen: no distress, normal appearing HEENT: oral mucosa pink and moist, NCAT Cardio: Reg rate Chest: normal effort, normal rate of breathing Abd: soft, non-distended Ext: no edema Psych: pleasant, normal affect Skin: intact Musculoskeletal:     Cervical back: Normal range of motion and neck supple.     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  Shuffling gait- R hand held at side- HHA with L side LUE 5-/5 and RUE 4+/5- but just a trace difference between the 2 sides RLE- 4/5 and LLE 4+/5- but again very close, just R side very slightly decreased strength compared to L.   Tender to palpation over medial aspect of heel Skin:    Comments: Multiple healed scars on left temple from prior surgeries. Healed abrasions bilateral shins.  Wasn't able to see backside due to not able to turn to show buttocks. Heels OK B/L R forearm IV- OK  Neurological:     Comments:  Decreased hearing with mild delay in processing. he was oriented to self, place, month, age, DOB, Pres. He was able to follow simple one and two step commands.   Somewhat apraxic? Staggering gait Intact to light touch in all 4 extremities  Psychiatric:     Comments: Slowed/delayed responses   Assessment/Plan: 1. Functional deficits which require 3+ hours per day of interdisciplinary therapy in a comprehensive inpatient rehab setting.  Physiatrist is providing close team supervision and 24 hour management of active medical problems listed below.  Physiatrist and rehab team continue to assess barriers to discharge/monitor patient progress toward functional and medical goals  Care Tool:  Bathing    Body parts bathed by patient: Right arm,Left arm,Chest,Abdomen,Front perineal area,Buttocks,Right upper leg,Left upper leg,Right lower leg,Left lower leg,Face         Bathing assist Assist Level: Contact Guard/Touching assist     Upper Body Dressing/Undressing Upper body dressing   What is the patient wearing?: Pull over shirt    Upper body assist Assist Level: Supervision/Verbal cueing    Lower Body Dressing/Undressing Lower body dressing  What is the patient wearing?: Incontinence brief,Pants     Lower body assist Assist for lower body dressing: Minimal Assistance - Patient > 75%     Toileting Toileting    Toileting assist Assist for toileting: Contact Guard/Touching assist     Transfers Chair/bed transfer  Transfers assist     Chair/bed transfer assist level: Contact Guard/Touching assist     Locomotion Ambulation   Ambulation assist      Assist level: Minimal Assistance - Patient > 75% Assistive device: Walker-rolling Max distance: 18ft   Walk 10 feet activity   Assist     Assist level: Minimal Assistance - Patient > 75% Assistive device: Walker-rolling   Walk 50 feet activity   Assist Walk 50 feet with 2 turns activity did not occur:  Safety/medical concerns  Assist level: Minimal Assistance - Patient > 75% Assistive device: Walker-rolling    Walk 150 feet activity   Assist Walk 150 feet activity did not occur: Safety/medical concerns  Assist level: Minimal Assistance - Patient > 75% Assistive device: Walker-rolling    Walk 10 feet on uneven surface  activity   Assist     Assist level: Minimal Assistance - Patient > 75% Assistive device: Aeronautical engineer Will patient use wheelchair at discharge?: No             Wheelchair 50 feet with 2 turns activity    Assist            Wheelchair 150 feet activity     Assist          Blood pressure 108/87, pulse 94, temperature 98.6 F (37 C), resp. rate 20, height 5\' 8"  (1.727 m), weight 75.8 kg, SpO2 95 %.    Medical Problem List and Plan: 1.  R>L weakness and impaired gait secondary to HTN emergency and encephalopathy             -patient may  shower             -ELOS/Goals: 16-20 days- supervison  Continue CIR 2.  Antithrombotics: -DVT/anticoagulation:  Pharmaceutical: d/c Lovenox since ambulating >160 feet             -antiplatelet therapy: N/A 3. Right upper back myofascial pain: tylenol prn. Add heating pad.  4. Mood: LCSW to follow for evaluation and support when appropriate             -antipsychotic agents: N/A 5. Neuropsych: This patient may be intermittently capable of making decisions on his own behalf. 6. Skin/Wound Care: Routine pressure-relief measures. May d/c IV 7. Fluids/Electrolytes/Nutrition: Monitor intake/output.  Offer supplements as needed Po intake. 8.  Hypertension: Monitor blood pressures 3 times daily.               --Continue hydralazine 25 mg 3 times daily,             --Monitor renal function with ACE back on board  -add flomax w/ supper 9.  Acute renal failure: Improved with IV fluids for hydration. BUN/SCr- 41/1.79-->40/1.40.              --Felt to be due to ACE--Cozaar  resumed on 04/12 as serum creatinine improving.             --May see some elevation due to IV dye for MRI with contrast 04/12 10.  Delirium: d/c seroquel 11.  T2DM: Monitor blood sugars ac/hs.  d/c ensure Max given sucralose.   --CBG improved to 75-269: encouraged low sugar diet.              --increase Lantus to 32 units daily with SSI for elevated BS---titrate as indicated. 12.  B12 deficiency: Continue IM supplementation weekly. 13. Hyponatremia: Recheck in am.   14. Bradycardia to 47: d/c amlodipine.  15. Overweight BMI 25.42: d/c Ensure max 16. Urinary frequency: UA/UC from 4/8 reviewed and had multiple species: recollect today. Add flomax at night. 17. Right sided plantar fasciitis- discussed with patient and OT and requested that he be given plantar fascia stretching exercises.   18. Anemia: hg 10.8, repeat weekly.   LOS: 6 days A FACE TO FACE EVALUATION WAS PERFORMED  Caleb Dougherty 11/14/2020, 12:31 PM

## 2020-11-14 NOTE — Plan of Care (Signed)
  Problem: RH BOWEL ELIMINATION Goal: RH STG MANAGE BOWEL WITH ASSISTANCE Description: STG Manage Bowel with mod I  Assistance. Outcome: Progressing   Problem: RH BLADDER ELIMINATION Goal: RH STG MANAGE BLADDER WITH ASSISTANCE Description: STG Manage Bladder With  mod I Assistance Outcome: Progressing   Problem: RH SAFETY Goal: RH STG ADHERE TO SAFETY PRECAUTIONS W/ASSISTANCE/DEVICE Description: STG Adhere to Safety Precautions With cues/reminders Assistance/Device. Outcome: Progressing   Problem: RH KNOWLEDGE DEFICIT GENERAL Goal: RH STG INCREASE KNOWLEDGE OF SELF CARE AFTER HOSPITALIZATION Description: Patient and son will be able to manage patient care at discharge using handouts and educational tools independently Outcome: Progressing   Problem: Consults Goal: RH GENERAL PATIENT EDUCATION Description: See Patient Education module for education specifics. Outcome: Progressing

## 2020-11-14 NOTE — Progress Notes (Signed)
Speech Language Pathology Daily Session Note  Patient Details  Name: Caleb Dougherty MRN: [id number] Date of Birth: 1928-11-21  Today's Date: 11/14/2020 SLP Individual Time: 8887-5797 SLP Individual Time Calculation (min): 45 min  Short Term Goals: Week 1: SLP Short Term Goal 1 (Week 1): Pt will utilize external memory aids to recall new, daily information with Min A verbal and question cues. SLP Short Term Goal 2 (Week 1): Pt will orient to date and medical situation with use of calendar with mod A verbal and visual cues. SLP Short Term Goal 3 (Week 1): Pt will complete basic problem solving tasks with Mod A verbal and visual cues. SLP Short Term Goal 4 (Week 1): Pt will demonstrate intellectual awareness and identify 3 physical limitations with Mod A verbal and visual cues.  Skilled Therapeutic Interventions:Skilled SLP intervention focused on cognition. Pt seated in recliner in room completing 1000 piece puzzle. Pt stated he has not completed a puzzle in many years but wanted to try since it was brought in by son. Pt required orientation to medication situation. He stated "I dont know why im here." Oriented patient to medical situation and date using printed calendar. He answered basic problem solving questions using a doctors appointment card containing dates, times, and locations with min A verbal cues. He was unable to recall any of the information from appointment card after 10  second delay and no visual cue. Pt left seated in chair in room with chair alarm set and call button within reach. Cont with therapy per plan of care.      Pain Pain Assessment Pain Scale: Faces Pain Score: 0-No pain Faces Pain Scale: No hurt  Therapy/Group: Individual Therapy  Gregary Signs A Tynslee Bowlds 11/14/2020, 11:11 AM

## 2020-11-14 NOTE — Progress Notes (Signed)
Occupational Therapy Session Note  Patient Details  Name: Caleb Dougherty MRN: [id number] Date of Birth: 1928-11-20  Today's Date: 11/14/2020 OT Individual Time: 2641-5830 OT Individual Time Calculation (min): 57 min    Short Term Goals: Week 1:  OT Short Term Goal 1 (Week 1): Pt will don pants with CGA OT Short Term Goal 2 (Week 1): Pt will transfer to toilet with CGA OT Short Term Goal 3 (Week 1): Pt will demonstrate improved orientation with use of external cues if necessary OT Short Term Goal 4 (Week 1): Pt will require no more than min cueing for emergent awareness  Skilled Therapeutic Interventions/Progress Updates:    Treatment session with focus on self-care retraining, functional transfers, and safety awareness.  Pt received semi-reclined in bed agreeable to therapy session.  Pt completed bed mobility with supervision.  Pt ambulated to room shower with RW with CGA and min cues for positioning of RW when transferring to tub bench in room shower. Pt completed bathing at overall CGA at sit > stand level with good sequencing and min cues for safety awareness.  Pt completed dressing at sit > stand from EOB.  Pt utilized RW to prop alternating BLE when donning socks and threading pant legs.  Pt donned incontinence pullup upside down, requiring cues for orientation of clothing.  Pt required assistance when threading LLE in pants and CGA for sitting balance due to increased R lean when threading pant legs.  Pt completed oral care in standing at sink with supervision and min cues to due increased R lean with fatigue.  Pt reports need to toilet.  Ambulated to bathroom with RW with increased cues for safety and positioning of RW due to urgency.  Pt ambulated back to sink to complete hand hygiene and then transferred to recliner with RW with supervision.  Pt remained upright in recliner, seat belt alarm on and all needs in reach, working on puzzle upon exit.    Therapy Documentation Precautions:   Precautions Precautions: Fall Precaution Comments: HOH Restrictions Weight Bearing Restrictions: No General:   Vital Signs: Therapy Vitals Temp: 98.6 F (37 C) Pulse Rate: 94 Resp: 20 BP: 108/87 Patient Position (if appropriate): Lying Oxygen Therapy SpO2: 95 % O2 Device: Room Air Pain:  Pt reports pain in R heel.  MD notified.   Therapy/Group: Individual Therapy  Simonne Come 11/14/2020, 8:38 AM

## 2020-11-14 NOTE — Progress Notes (Signed)
Physical Therapy Session Note  Patient Details  Name: Caleb Dougherty MRN: [id number] Date of Birth: 16-May-1929  Today's Date: 11/14/2020 PT Individual Time: 4462-8638 PT Individual Time Calculation (min): 54 min   Short Term Goals: Week 1:  PT Short Term Goal 1 (Week 1): Patient will completed bed mobility with CGA consistently PT Short Term Goal 2 (Week 1): Patient will transfer bed <> wc with LRAD and CGA consistently PT Short Term Goal 3 (Week 1): Patient will ambulate >64ft with LRAD and CGA  Skilled Therapeutic Interventions/Progress Updates:   Received pt sitting in recliner working on puzzle with NT present assessing vitals. Pt agreeable to therapy and denied any pain during session. Session with focus on functional mobility/transfers, toileting, generalized strengthening, dynamic standing balance/coordination, gait training, and improved activity tolerance. Pt reported urge to use restroom and transferred sit<>stand from recliner with RW and mod A due to low surface and ambulated 21ft with RW and CGA to bathroom and able to manage clothing with CGA (mild posterior LOB when standing). Pt able to void and stood at sink and washed hands with CGA. Pt transported to 68M ortho gym in [facility] total A for time management purposes. Pt ambulated 156ft x 2 trials with RW and CGA/min A. Pt demonstrated LE ataxia, flexed trunk and downward gaze, and decreased bilateral foot clearance. Pt reported "numbness" in bilateral feet; therefore took seated rest break. Noted mild swelling in bilateral feet. Donned ted hose and non-skid socks with total A and educated pt on importance of keeping LEs elevated to reduce edema; pt verbalized understanding. Pt performed ambulatory simulated car transfer with RW and min A using side step method. Educated pt on importance of turning to sit then getting legs in rather than side stepping into car for safety purposes. Pt ambulated 62ft on uneven surfaces (ramp) with RW and CGA with  cues to decrease cadence for safety and to push RW along rather than picking it up. Pt transported back to room in [facility] total A and ambulated 45ft with RW and CGA to recliner. Concluded session with pt sitting in recliner, needs within reach, and seatbelt alarm on. Provided pt with fresh ice water.   Therapy Documentation Precautions:  Precautions Precautions: Fall Precaution Comments: HOH Restrictions Weight Bearing Restrictions: No  Therapy/Group: Individual Therapy Alfonse Alpers PT, DPT   11/14/2020, 7:35 AM

## 2020-11-15 LAB — GLUCOSE, CAPILLARY
Glucose-Capillary: 85 mg/dL (ref 70–99)
Glucose-Capillary: 159 mg/dL — ABNORMAL HIGH (ref 70–99)
Glucose-Capillary: 151 mg/dL — ABNORMAL HIGH (ref 70–99)
Glucose-Capillary: 181 mg/dL — ABNORMAL HIGH (ref 70–99)

## 2020-11-15 MED ORDER — LOSARTAN POTASSIUM 50 MG PO TABS
75.0000 mg | ORAL_TABLET | Freq: Every day | ORAL | Status: DC
  Administered 2020-11-16 – 2020-11-19 (×4): 75 mg via ORAL
  Filled 2020-11-15 (×4): qty 1

## 2020-11-15 NOTE — Progress Notes (Signed)
Occupational Therapy Session Note  Patient Details  Name: Caleb Dougherty MRN: [id number] Date of Birth: 1928-08-20  Today's Date: 11/15/2020 OT Individual Time: 1583-0940 OT Individual Time Calculation (min): 60 min    Short Term Goals: Week 1:  OT Short Term Goal 1 (Week 1): Pt will don pants with CGA OT Short Term Goal 2 (Week 1): Pt will transfer to toilet with CGA OT Short Term Goal 3 (Week 1): Pt will demonstrate improved orientation with use of external cues if necessary OT Short Term Goal 4 (Week 1): Pt will require no more than min cueing for emergent awareness  Skilled Therapeutic Interventions/Progress Updates:    Treatment session with focus on self-care retraining, dynamic standing balance, and awareness.  Pt received upright in recliner agreeable to therapy session.  Pt donned pants with setup and CGA when standing to pull pants over hips.  Pt with minor LOB backwards when pulling pants over hips but able to correct with UE support on RW.  Pt ambulated to sink to complete oral care and shaving in standing.  Pt required intermittent CGA and close supervision during standing due to intermittent posterior lean.  Pt demonstrating good organization and sequencing during grooming tasks.  Pt returned to recliner and donned clean shirt with setup assist.  Engaged in plantar fascia stretch and mobilization in sitting, per MD request, as pt with c/o pain in R foot/heel.  Pt unable to reach foot to complete stretch independently therefore therapist completing.  Utilized small ball under foot for mobilization, completed 2 sets of 10 each exercise.  Pt remained upright in recliner to complete puzzle with chair alarm on and all needs in reach  Therapy Documentation Precautions:  Precautions Precautions: Fall Precaution Comments: HOH Restrictions Weight Bearing Restrictions: No Pain: Pt reports pain in back, not rated.  Repositioned kpad  Therapy/Group: Individual Therapy  Simonne Come 11/15/2020, 9:54 AM

## 2020-11-15 NOTE — Progress Notes (Addendum)
Patient ID: Caleb Dougherty, male   DOB: [birth year], 87 y.o.   MRN: [id number]  Met with pt to inform team conference goals of supervision level and target discharge date of 4/23. Pt is glad to hear this and will be happy to get back home with his wife, who he misses. Contacted son-Franklin to inform of goals and discharge date and asked to set up family education for this week. He will contact brother and get back with this worker regarding day and time that works for him. He reports pt needs his own rolling walker since he was using wife's but she uses it. Will order rolling walker from [employer] and will await return call from son.  11:17 AM Spoke with Curtis-pt's other son to schedule family education for Friday @ 1:00-3:30. Will let treatment team know this.

## 2020-11-15 NOTE — Progress Notes (Signed)
Occupational Therapy Session Note  Patient Details  Name: Caleb Dougherty MRN: [id number] Date of Birth: 02/20/1929  Today's Date: 11/15/2020 OT Individual Time: 1507-1534 OT Individual Time Calculation (min): 27 min   Short Term Goals: Week 1:  OT Short Term Goal 1 (Week 1): Pt will don pants with CGA OT Short Term Goal 2 (Week 1): Pt will transfer to toilet with CGA OT Short Term Goal 3 (Week 1): Pt will demonstrate improved orientation with use of external cues if necessary OT Short Term Goal 4 (Week 1): Pt will require no more than min cueing for emergent awareness  Skilled Therapeutic Interventions/Progress Updates:    Pt greeted ambulating out of bathroom with nursing and RW. Handoff to OT for treatment session. Therapetic exercises completed in room. 3 sets of 10 standing partial squats w/ RW, B arm raises, seated hip extension, seated knee extension. Pt took rest breaks in between sets. Pt ambulated over to recliner at end of session with RW and CGA. Pt left seated in recliner with alarm belt on, call bell in reach, and needs met.   Therapy Documentation Precautions:  Precautions Precautions: Fall Precaution Comments: HOH Restrictions Weight Bearing Restrictions: No Pain:  denies pain  Therapy/Group: Individual Therapy   S  11/15/2020, 3:25 PM 

## 2020-11-15 NOTE — Progress Notes (Signed)
PROGRESS NOTE   Subjective/Complaints: No complaints this morning.  Plan for d/c Saturday Heel pain improved- Judson Roch educated regarding using shoes when walking.  Will need caregiver training  ROS: + right sided upper back pain when ambulating with RW. Denies other sources of pain, denies constipation, +urinary frequency, denies dysuria, +right sided heel pain  Objective:   No results found. Recent Labs    11/14/20 0600  WBC 7.2  HGB 10.8*  HCT 33.0*  PLT 286   Recent Labs    11/14/20 0600  NA 137  K 4.1  CL 104  CO2 28  GLUCOSE 78  BUN 32*  CREATININE 1.21  CALCIUM 9.1    Intake/Output Summary (Last 24 hours) at 11/15/2020 0941 Last data filed at 11/15/2020 0715 Gross per 24 hour  Intake 700 ml  Output 1850 ml  Net -1150 ml        Physical Exam: Vital Signs Blood pressure (!) 171/67, pulse 70, temperature 98.6 F (37 C), temperature source Oral, resp. rate 18, height 5\' 8"  (1.727 m), weight 75.8 kg, SpO2 100 %. Gen: no distress, normal appearing HEENT: oral mucosa pink and moist, NCAT Cardio: Reg rate Chest: normal effort, normal rate of breathing Abd: soft, non-distended Ext: no edema Psych: pleasant, normal affect Skin: intact Musculoskeletal:     Cervical back: Normal range of motion and neck supple.     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  Shuffling gait- R hand held at side- HHA with L side LUE 5-/5 and RUE 4+/5- but just a trace difference between the 2 sides RLE- 4/5 and LLE 4+/5- but again very close, just R side very slightly decreased strength compared to L.   Tender to palpation over medial aspect of heel Skin:    Comments: Multiple healed scars on left temple from prior surgeries. Healed abrasions bilateral shins.  Wasn't able to see backside due to not able to turn to show buttocks. Heels OK B/L R forearm IV- OK  Neurological:     Comments: Decreased hearing  with mild delay in processing. he was oriented to self, place, month, age, DOB, Pres. He was able to follow simple one and two step commands.   Somewhat apraxic? Staggering gait Intact to light touch in all 4 extremities  Psychiatric:     Comments: Slowed/delayed responses   Assessment/Plan: 1. Functional deficits which require 3+ hours per day of interdisciplinary therapy in a comprehensive inpatient rehab setting.  Physiatrist is providing close team supervision and 24 hour management of active medical problems listed below.  Physiatrist and rehab team continue to assess barriers to discharge/monitor patient progress toward functional and medical goals  Care Tool:  Bathing    Body parts bathed by patient: Right arm,Left arm,Chest,Abdomen,Front perineal area,Buttocks,Right upper leg,Left upper leg,Right lower leg,Left lower leg,Face         Bathing assist Assist Level: Contact Guard/Touching assist     Upper Body Dressing/Undressing Upper body dressing   What is the patient wearing?: Pull over shirt    Upper body assist Assist Level: Supervision/Verbal cueing    Lower Body Dressing/Undressing Lower body dressing      What is the patient  wearing?: Pants     Lower body assist Assist for lower body dressing: Contact Guard/Touching assist     Toileting Toileting    Toileting assist Assist for toileting: Contact Guard/Touching assist     Transfers Chair/bed transfer  Transfers assist     Chair/bed transfer assist level: Contact Guard/Touching assist     Locomotion Ambulation   Ambulation assist      Assist level: Minimal Assistance - Patient > 75% Assistive device: Walker-rolling Max distance: 166ft   Walk 10 feet activity   Assist     Assist level: Minimal Assistance - Patient > 75% Assistive device: Walker-rolling   Walk 50 feet activity   Assist Walk 50 feet with 2 turns activity did not occur: Safety/medical concerns  Assist level:  Minimal Assistance - Patient > 75% Assistive device: Walker-rolling    Walk 150 feet activity   Assist Walk 150 feet activity did not occur: Safety/medical concerns  Assist level: Minimal Assistance - Patient > 75% Assistive device: Walker-rolling    Walk 10 feet on uneven surface  activity   Assist     Assist level: Contact Guard/Touching assist Assistive device: Aeronautical engineer Will patient use wheelchair at discharge?: No             Wheelchair 50 feet with 2 turns activity    Assist            Wheelchair 150 feet activity     Assist          Blood pressure (!) 171/67, pulse 70, temperature 98.6 F (37 C), temperature source Oral, resp. rate 18, height 5\' 8"  (1.727 m), weight 75.8 kg, SpO2 100 %.    Medical Problem List and Plan: 1.  R>L weakness and impaired gait secondary to HTN emergency and encephalopathy             -patient may  shower             -ELOS/Goals: 16-20 days- supervison  Continue CIR 2.  Antithrombotics: -DVT/anticoagulation:  Pharmaceutical: d/c Lovenox since ambulating >160 feet             -antiplatelet therapy: N/A 3. Right upper back myofascial pain: tylenol prn. Add heating pad. Discussed walking with shoes on hospital floors to minimize plantar fasciitis.  4. Mood: LCSW to follow for evaluation and support when appropriate             -antipsychotic agents: N/A 5. Neuropsych: This patient may be intermittently capable of making decisions on his own behalf. 6. Skin/Wound Care: Routine pressure-relief measures. May d/c IV 7. Fluids/Electrolytes/Nutrition: Monitor intake/output.  Offer supplements as needed Po intake. 8.  Hypertension: Monitor blood pressures 3 times daily.               --Continue hydralazine 25 mg 3 times daily,             --Monitor renal function with ACE back on board, increase Cozaar to 75mg .   -add flomax w/ supper 9.  Acute renal failure: normalized, discussed with  patient.              --May see some elevation due to IV dye for MRI with contrast 04/12 10.  Delirium: d/c seroquel 11.  T2DM: Monitor blood sugars ac/hs.               d/c ensure Max given sucralose.   --CBG improved to 75-269: encouraged low sugar diet.              --  increase Lantus to 32 units daily with SSI for elevated BS---titrate as indicated. 12.  B12 deficiency: Continue IM supplementation weekly. 13. Hyponatremia: Recheck in am.   14. Bradycardia to 47: d/c amlodipine.  15. Overweight BMI 25.42: d/c Ensure max 16. Urinary frequency: UA/UC from 4/8 reviewed and had multiple species: recollect today. Add flomax at night. 17. Right sided plantar fasciitis- discussed with patient and OT and requested that he be given plantar fascia stretching exercises.   18. Anemia: hg 10.8, repeat weekly.   LOS: 7 days A FACE TO FACE EVALUATION WAS PERFORMED  Clide Deutscher Carla Whilden 11/15/2020, 9:41 AM

## 2020-11-15 NOTE — Patient Care Conference (Signed)
Inpatient RehabilitationTeam Conference and Plan of Care Update Date: 11/15/2020   Time: 9:38 AM    Patient Name: Caleb Dougherty      Medical Record Number: [id number]  Date of Birth: [birth year] Sex: Male         Room/Bed: 5C07C/5C07C-01 Payor Info: Payor: [employer] MEDICARE / Plan: [facility] MEDICARE / Product Type: *No Product type* /    Admit Date/Time:  11/08/2020  5:46 PM  Primary Diagnosis:  Encephalopathy  Hospital Problems: Principal Problem:   Encephalopathy Active Problems:   Hypertensive emergency    Expected Discharge Date: Expected Discharge Date: 11/19/20  Team Members Present: Physician leading conference: Dr. Leeroy Cha Care Coodinator Present: Dorthula Nettles, RN, BSN, CRRN;Becky Dupree, LCSW Nurse Present: Dorthula Nettles, RN PT Present: Estevan Ryder, PT OT Present: Simonne Come, OT SLP Present: Charolett Bumpers, SLP PPS Coordinator present : Gunnar Fusi, SLP     Current Status/Progress Goal Weekly Team Focus  Bowel/Bladder   continent bowel LBM 4/18. Urgency and frequent urination causing incontinence. briefs during the day. prim[facility]it cath at [facility] for comfort  continent bowel and bladder  timed toileting during the day. Need alternative at HS as patient voids q 1hr and this is disruptive for patient's sleep   Swallow/Nutrition/ Hydration             ADL's   CGA bathing at sit > stand level, CGA ambulatory transfers with RW with intermittent cues for safety/RW positioning, Min A LB dressing  Supervision overall  ADL retraining, dynamic standing balance, safety awareness, endurance   Mobility   transfers with RW CGA, gait 21ft with RW CGA  supervision overall  functional mobility/transfers, generalized strengthening, dynamic standing balance/coordination, ambulation, and endurance.   Communication             Safety/Cognition/ Behavioral Observations  max-mod A  Min A  orientation, basic problem solving, recall and awareness   Pain   denies  pain  less than 3 out of 10  assess q shift and PRN   Skin   buttocks red/excoriated- blanchable  no skin breakdown  assess q shift and PRN. barrier cream PRN     Discharge Planning:  HOme with son and daughter in-law already caring or his wife who has dementia. Family aware will need 24/7 supervision at DC   Team Discussion: Patient complains of right heel pain, may be related to plantar fasciitis, diabetes is not well controlled, Cr is normalizing and AKI is getting better. Nursing reports patient is incontinent of B/B at times, treating pain appropriately, and no sleep issues. His bottom is excoriated and nursing is applying barrier cream. Educating on fall risk, safety, skin care, and diabetes. Social work reports patient is to discharge home with son and daughter-n-law, who will provide care. Patient on target to meet rehab goals: yes, patient is contact guard for transfers. Min assist with a RW if impulsive. He was able to stand at the sink today and shave. He has supervision goals. Contact guard with a RW, walks up to 200 ft with a RW. He will need a RW at discharge. SLP is to do a swallowing screen today, he has min assist goals for orientation and memory.   *See Care Plan and progress notes for long and short-term goals.   Revisions to Treatment Plan:  Continue to increase Levemir for uncontrolled diabetes.  Teaching Needs: Family education, medication management, pain management, diabetes education, insulin education, insulin injection education, skin/wound care, transfer training, gait training, balance,  training, endurance training, safety training.  Current Barriers to Discharge: Decreased caregiver support, Medical stability, Home enviroment access/layout, Incontinence, Wound care, Lack of/limited family support, Weight, Medication compliance and Behavior  Possible Resolutions to Barriers: Continue current medications, provide emotional support.     Medical Summary Current  Status: excoriation on bottom, uncontrolled hypertension, uncontrolled type 2 DM, plantar fasciitis  Barriers to Discharge: Wound care;Medical stability  Barriers to Discharge Comments: excoriation on bottom, uncontrolled hypertension, uncontrolled type 2 DM, plantar fasciitis Possible Resolutions to [employer]: continue barrier cream, continue titrating up Levemir, carb modified diet, dietary education, continue anti-hypertensives, continue exercises for plantar fasciitis   Continued Need for Acute Rehabilitation Level of Care: The patient requires daily medical management by a physician with specialized training in physical medicine and rehabilitation for the following reasons: Direction of a multidisciplinary physical rehabilitation program to maximize functional independence : Yes Medical management of patient stability for increased activity during participation in an intensive rehabilitation regime.: Yes Analysis of laboratory values and/or radiology reports with any subsequent need for medication adjustment and/or medical intervention. : Yes   I attest that I was present, lead the team conference, and concur with the assessment and plan of the team.   Cristi Loron 11/15/2020, 1:01 PM

## 2020-11-15 NOTE — Progress Notes (Signed)
Speech Language Pathology Daily Session Note  Patient Details  Name: Caleb Dougherty MRN: [id number] Date of Birth: [birth year]  Today's Date: 11/15/2020 SLP Individual Time: 1033-1100 SLP Individual Time Calculation (min): 27 min  Short Term Goals: Week 1: SLP Short Term Goal 1 (Week 1): Pt will utilize external memory aids to recall new, daily information with Min A verbal and question cues. SLP Short Term Goal 2 (Week 1): Pt will orient to date and medical situation with use of calendar with mod A verbal and visual cues. SLP Short Term Goal 3 (Week 1): Pt will complete basic problem solving tasks with Mod A verbal and visual cues. SLP Short Term Goal 4 (Week 1): Pt will demonstrate intellectual awareness and identify 3 physical limitations with Mod A verbal and visual cues.  Skilled Therapeutic Interventions:Skilled ST services focused on cognitive skills. Pt agreed to baseline deficits in memory and agreed with slight changes in problem solving following SLUMs assessment, however pt's demonstration of skills were further impacted by [facility]. Pt scored 17 out 30 on SLUMS indicating severe cognitive deficits, primarily noted in memory and in which being [facility] of also required repeating of directions. Pt was orientated x4 with supervision A verbal cues for date only. Family education is needs if pt is to return home as caregiver for wife and himself. Pt was left in room with call bell within reach and chair alarm set. SLP recommends to continue skilled services.     Pain Pain Assessment Pain Scale: 0-10 Pain Score: 0-No pain  Therapy/Group: Individual Therapy  [name]  [facility] 11/15/2020, 11:10 AM

## 2020-11-15 NOTE — Progress Notes (Signed)
Physical Therapy Session Note  Patient Details  Name: Caleb Dougherty MRN: [id number] Date of Birth: 1928/12/24  Today's Date: 11/15/2020 PT Individual Time: 2297-9892 PT Individual Time Calculation (min): 58 min   Short Term Goals: Week 1:  PT Short Term Goal 1 (Week 1): Patient will completed bed mobility with CGA consistently PT Short Term Goal 2 (Week 1): Patient will transfer bed <> wc with LRAD and CGA consistently PT Short Term Goal 3 (Week 1): Patient will ambulate >79ft with LRAD and CGA  Skilled Therapeutic Interventions/Progress Updates:    Patient received sitting up in recliner, agreeable to PT. He reports back pain with mobility, but did not rate. PT providing rest breaks, distractions and repositioning to assist with pain management. He requested to remain in the room to do therapy due to back pain, but was agreeable to stand. CGA/MinA to come to stand. Appropriate static and dynamic balance maintained in standing to continue working on puzzle. CGA provided for safety. He was able to complete appropriate dual task and remain standing for ~7 mins before needing to sit. Dynamic sitting balance completed reaching outside BOS and no back support provided. Appropriate core engagement noted to assist in maintaining upright posture. Close supervision provided for safety. At end of session, patient remaining in recliner, chair alarm on, call light within reach.   Therapy Documentation Precautions:  Precautions Precautions: Fall Precaution Comments: HOH Restrictions Weight Bearing Restrictions: No    Therapy/Group: Individual Therapy  Karoline Caldwell, PT, DPT, CBIS  11/15/2020, 7:45 AM

## 2020-11-16 LAB — GLUCOSE, CAPILLARY
Glucose-Capillary: 76 mg/dL (ref 70–99)
Glucose-Capillary: 109 mg/dL — ABNORMAL HIGH (ref 70–99)
Glucose-Capillary: 272 mg/dL — ABNORMAL HIGH (ref 70–99)
Glucose-Capillary: 417 mg/dL — ABNORMAL HIGH (ref 70–99)

## 2020-11-16 LAB — GLUCOSE, RANDOM: Glucose, Bld: 391 mg/dL — ABNORMAL HIGH (ref 70–99)

## 2020-11-16 NOTE — Progress Notes (Signed)
Occupational Therapy Session Note  Patient Details  Name: Caleb Dougherty MRN: [id number] Date of Birth: [birth year]  Today's Date: 11/16/2020 OT Individual Time: 1400-1500 OT Individual Time Calculation (min): 60 min    Short Term Goals: Week 1:  OT Short Term Goal 1 (Week 1): Pt will don pants with CGA OT Short Term Goal 2 (Week 1): Pt will transfer to toilet with CGA OT Short Term Goal 3 (Week 1): Pt will demonstrate improved orientation with use of external cues if necessary OT Short Term Goal 4 (Week 1): Pt will require no more than min cueing for emergent awareness  Skilled Therapeutic Interventions/Progress Updates:    Treatment session with focus on functional mobility, activity tolerance, orientation, and d/c planning.  Pt received upright in recliner working on jigsaw puzzle in room.  Therapist engaged with pt during puzzle activity with focus on orientation to time and situation as well as focus on upcoming d/c.  Pt oriented to time and place this session, however when telling therapist about family pt would fluctuate in and out of orientation.  Pt is aware of upcoming d/c date and reports that he may need to d/c to his son's home, despite wanting to go to his home.  He is understanding of the recommendations but reports not wanting to "have to rely" on someone else.  Pt ambulated 175' with RW with CGA.  Engaged in discussion about leisure activities and memories of [place] from childhood.  Pt returned to recliner and remained upright in recliner with seat belt alarm on and all needs in reach.  Therapy Documentation Precautions:  Precautions Precautions: Fall Precaution Comments: HOH Restrictions Weight Bearing Restrictions: No General:   Vital Signs: Therapy Vitals Temp: 97.6 F (36.4 C) Temp Source: Oral Pulse Rate: (!) 51 Resp: 18 BP: (!) 146/69 Patient Position (if appropriate): Sitting Oxygen Therapy SpO2: 100 % O2 Device: Room Air Pain:  Pt with no c/o  pain   Therapy/Group: Individual Therapy  Simonne Come 11/16/2020, 3:26 PM

## 2020-11-16 NOTE — Progress Notes (Signed)
Speech Language Pathology Weekly Progress and Session Note  Patient Details  Name: Caleb Dougherty MRN: [id number] Date of Birth: Mar 02, 1929  Beginning of progress report period: November 09, 2020 End of progress report period: November 16, 2020  Today's Date: 11/16/2020 SLP Individual Time: 8891-6945 SLP Individual Time Calculation (min): 45 min  Short Term Goals: Week 1: SLP Short Term Goal 1 (Week 1): Pt will utilize external memory aids to recall new, daily information with Min A verbal and question cues. SLP Short Term Goal 1 - Progress (Week 1): Progressing toward goal SLP Short Term Goal 2 (Week 1): Pt will orient to date and medical situation with use of calendar with mod A verbal and visual cues. SLP Short Term Goal 2 - Progress (Week 1): Updated due to goal met SLP Short Term Goal 3 (Week 1): Pt will complete basic problem solving tasks with Mod A verbal and visual cues. SLP Short Term Goal 3 - Progress (Week 1): Updated due to goal met SLP Short Term Goal 4 (Week 1): Pt will demonstrate intellectual awareness and identify 3 physical limitations with Mod A verbal and visual cues. SLP Short Term Goal 4 - Progress (Week 1): Progressing toward goal    New Short Term Goals: Week 2: SLP Short Term Goal 1 (Week 2): Pt will utilize external memory aids to recall new, daily information with Min A verbal and question cues. SLP Short Term Goal 2 (Week 2): Pt will orient to date and medical situation with use of calendar with min A verbal and visual cues. SLP Short Term Goal 3 (Week 2): Pt will complete basic problem solving tasks with Min A verbal and visual cues. SLP Short Term Goal 4 (Week 2): Pt will demonstrate intellectual awareness and identify 3 physical limitations with Mod A verbal and visual cues.  Weekly Progress Updates: Pt has made consistent gains and has met 2 out of 4 STG's this reporting period due to improved orientation and awareness of physical limitations. Currently, pt  continues to require mod A verbal cues for use of external memory aids for recall of relevant rehab information and min A with visual and verbal cues for basic problem solving tasks. Pt/family education is ongoing. Pt would benefit from continued skilled SLP intervention to maximize his functional independence prior to discharge.      Intensity: Minumum of 1-2 x/day, 30 to 90 minutes Frequency: 3 to 5 out of 7 days Duration/Length of Stay: 12 days Treatment/Interventions: Cognitive remediation/compensation;Functional tasks;Therapeutic Activities;Patient/family education   Daily Session  Skilled Therapeutic Interventions: Skilled SLP intervention focused on cognition. Education competed with son on memory book and supervision that patient will need once home. Educated son on functional tasks that patient will need more assistance to complete including finances, scheduling and keeping track of appointments, and household tasks such as cooking. Son verbalized understanding. Pt answered problem solving questions relating to todays schedule with min A cues for simple questions. He required mod A for error awareness with specific details relating to time. Cont with therapy per plan of care.  General    Pain Pain Assessment Pain Scale: Faces Faces Pain Scale: No hurt  Therapy/Group: Individual Therapy  Darrol Poke Abir Eroh 11/16/2020, 8:09 AM

## 2020-11-16 NOTE — Progress Notes (Signed)
Nutrition Follow-up  DOCUMENTATION CODES:   Non-severe (moderate) malnutrition in context of chronic illness  INTERVENTION:   - Please obtain updated weight  - Magic Cup BID with lunch and dinner meals, each supplement provides 290 kcal and 9 grams of protein  - Continue ProSource Plus 30 ml po BID, each supplement provides 100 kcal and 15 grams of protein  - Encourage continued adequate PO intake  NUTRITION DIAGNOSIS:   Moderate Malnutrition related to chronic illness as evidenced by mild fat depletion,moderate fat depletion,mild muscle depletion,moderate muscle depletion.  Ongoing, being addressed via supplements  GOAL:   Patient will meet greater than or equal to 90% of their needs  Progressing  MONITOR:   PO intake,Supplement acceptance,Labs,Weight trends  REASON FOR ASSESSMENT:   Malnutrition Screening Tool    ASSESSMENT:   87 year old male with PMH of T2DM, melanoma, HOH who was admitted on 11/01/20 with with left-sided weakness, leftward gaze, facial droop, and global aphasia. On 4/6, he was noted to have resolution of weakness and facial droop with improvement in ability to follow commands but continued aphasia. LP completed and showed elevated protein. Pt was also found to have low vitamin B-12 levels. Admitted to CIR on 4/12.  Noted plan for d/c on 4/23.  Pt with excellent PO intake, completing 100% of last 8 documented meals. Pt accepting 100% of ProSource Plus protein supplements per [facility] documentation. Will continue with current supplement regimen.  No updated weight since admission on 4/12. Recommend obtaining updated weight to assess trends.  Meal Completion: 100% x last 8 meals  Medications reviewed and include: ProSource Plus BID, vitamin B-12 injection weekly, SSI, lantus 32 units daily, vitamin B-12 tablet daily  Labs reviewed. CBG's: 76-181 x 24 hours  UOP: 1825 ml x 12 hours  Diet Order:   Diet Order            Diet Carb Modified Fluid  consistency: Thin; Room service appropriate? Yes with Assist  Diet effective now                 EDUCATION NEEDS:   Education needs have been addressed  Skin:  Skin Assessment: Reviewed RN Assessment  Last BM:  11/15/20 medium type 3  Height:   Ht Readings from Last 1 Encounters:  11/08/20 5\' 8"  (1.727 m)    Weight:   Wt Readings from Last 1 Encounters:  11/08/20 75.8 kg    BMI:  Body mass index is 25.42 kg/m.  Estimated Nutritional Needs:   Kcal:  1700-1900  Protein:  80-95 grams  Fluid:  1.7-1.9 L    Gustavus Bryant, MS, RD, LDN Inpatient Clinical Dietitian Please see [facility] for contact information.

## 2020-11-16 NOTE — Progress Notes (Signed)
Physical Therapy Session Note  Patient Details  Name: Caleb Dougherty MRN: [id number] Date of Birth: [birth year]  Today's Date: 11/16/2020 PT Individual Time: 1032-1059 PT Individual Time Calculation (min): 27 min   Short Term Goals: Week 1:  PT Short Term Goal 1 (Week 1): Patient will completed bed mobility with CGA consistently PT Short Term Goal 2 (Week 1): Patient will transfer bed <> wc with LRAD and CGA consistently PT Short Term Goal 3 (Week 1): Patient will ambulate >74f with LRAD and CGA  Skilled Therapeutic Interventions/Progress Updates:   Pt received supine in bed and agreeable to PT. Supine>sit transfer with  Supervision assist and cues for use of bed rail as needed. Donning pants from EOB with min assist for clothing management. Sit>stand performed with supervision assist, as pt pulled pants to waist without additional assist.  [facility] and supervision assist. Cues to keep all 4 feet on RW on floor in turns for safety. Gait training with RW through hall x 1570fwith supervision assist, one mild LOB, but able to correct without assist. Pt performed 5xSTS: 21 sec x2 (>15 sec indicates increased fall risk)  Seated LE therex. LAQ x12BLE, hip abduction with manual resistance x 12, reciprocal march x 12. Patient returned to room and performed stand pivot to recliner with RW and supervision assist. . Pt left sitting in recliner with call bell in reach and all needs met.         Therapy Documentation Precautions:  Precautions Precautions: Fall Precaution Comments: HOH Restrictions Weight Bearing Restrictions: No   Pain: denies   Therapy/Group: Individual Therapy  AuLorie Phenix/20/2022, 12:42 PM

## 2020-11-16 NOTE — Progress Notes (Signed)
Physical Therapy Session Note  Patient Details  Name: Caleb Dougherty MRN: [id number] Date of Birth: June 21, 1929  Today's Date: 11/16/2020 PT Individual Time: 7169-6789 PT Individual Time Calculation (min): 54 min   Short Term Goals: Week 1:  PT Short Term Goal 1 (Week 1): Patient will completed bed mobility with CGA consistently PT Short Term Goal 2 (Week 1): Patient will transfer bed <> wc with LRAD and CGA consistently PT Short Term Goal 3 (Week 1): Patient will ambulate >32ft with LRAD and CGA  Skilled Therapeutic Interventions/Progress Updates:   Received pt sitting on commode with NT present, PT took over with care. Pt agreeable to therapy and denied any pain during session. Session with focus on functional mobility/transfers, toileting, generalized strengthening, dynamic standing balance/coordination, gait training, NMR, and improved activity tolerance. Pt with soiled brief; replaced soiled brief with clean one with max A and pt stood and required min A to manage clothing. Pt stood at sink and washed hands with CGA with 1 posterior LOB requiring min A to correct. Pt performed WC mobility 158ft using BUE and supervision to elevator and transported remainder of way to 4W therapy gym in [facility] total A for time management purposes. Pt navigated 8 steps with 2 rails and CGA/min A alternating ascending with a step to and step through pattern and descending with a step to pattern. Pt required cues to slow speed and to ensure foot was completely on step prior to proceeding. Pt performed the following activities on Biodex with emphasis on weight shifting and dynamic standing balance: -Postural stability training on level static for 30 seconds with BUE support and 1 minute without UE support and CGA for balance. -Limits of stability training on level static with BUE support for 2 minutes with min A for balance -Catch baseball game on level medium with BUE support for 1 minute and 1 UE support for 1 minute  with CGA Pt then ambulated 368ft with RW and CGA. Pt demonstrates step to gait pattern, mild LE ataxia, flexed trunk and downward gaze, and decreased bilateral foot clearance. Pt reported increased bilateral knee pain with ambulation requring seated rest break. Pt transported back to room in [facility] total A and ambulated 61ft with RW and CGA to recliner. Concluded session with pt sitting in recliner working on puzzle, needs within reach, and seatbelt alarm on. Provided fresh ice water to pt.   Therapy Documentation Precautions:  Precautions Precautions: Fall Precaution Comments: HOH Restrictions Weight Bearing Restrictions: No  Therapy/Group: Individual Therapy Alfonse Alpers PT, DPT   11/16/2020, 7:33 AM

## 2020-11-16 NOTE — Progress Notes (Signed)
PROGRESS NOTE   Subjective/Complaints: Patient's chart reviewed- No issues reported overnight Continues to be hypertensive and bradycardic.   ROS: + right sided upper back pain when ambulating with RW. Denies other sources of pain, denies constipation, +urinary frequency, denies dysuria, +right sided heel pain  Objective:   No results found. Recent Labs    11/14/20 0600  WBC 7.2  HGB 10.8*  HCT 33.0*  PLT 286   Recent Labs    11/14/20 0600  NA 137  K 4.1  CL 104  CO2 28  GLUCOSE 78  BUN 32*  CREATININE 1.21  CALCIUM 9.1    Intake/Output Summary (Last 24 hours) at 11/16/2020 1225 Last data filed at 11/16/2020 0743 Gross per 24 hour  Intake 716 ml  Output 1825 ml  Net -1109 ml        Physical Exam: Vital Signs Blood pressure (!) 156/91, pulse (!) 48, temperature 98.2 F (36.8 C), temperature source Oral, resp. rate 18, height 5\' 8"  (1.727 m), weight 75.8 kg, SpO2 98 %. Gen: no distress, normal appearing HEENT: oral mucosa pink and moist, NCAT Cardio: Bradycardic Chest: normal effort, normal rate of breathing Abd: soft, non-distended Ext: no edema Psych: pleasant, normal affect Musculoskeletal:     Cervical back: Normal range of motion and neck supple.     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  Shuffling gait- R hand held at side- HHA with L side LUE 5-/5 and RUE 4+/5- but just a trace difference between the 2 sides RLE- 4/5 and LLE 4+/5- but again very close, just R side very slightly decreased strength compared to L.   Tender to palpation over medial aspect of heel Skin:    Comments: Multiple healed scars on left temple from prior surgeries. Healed abrasions bilateral shins.  Wasn't able to see backside due to not able to turn to show buttocks. Heels OK B/L R forearm IV- OK  Neurological:     Comments: Decreased hearing with mild delay in processing. he was oriented to self,  place, month, age, DOB, Pres. He was able to follow simple one and two step commands.   Somewhat apraxic? Staggering gait Intact to light touch in all 4 extremities  Psychiatric:     Comments: Slowed/delayed responses   Assessment/Plan: 1. Functional deficits which require 3+ hours per day of interdisciplinary therapy in a comprehensive inpatient rehab setting.  Physiatrist is providing close team supervision and 24 hour management of active medical problems listed below.  Physiatrist and rehab team continue to assess barriers to discharge/monitor patient progress toward functional and medical goals  Care Tool:  Bathing    Body parts bathed by patient: Right arm,Left arm,Chest,Abdomen,Front perineal area,Buttocks,Right upper leg,Left upper leg,Right lower leg,Left lower leg,Face         Bathing assist Assist Level: Contact Guard/Touching assist     Upper Body Dressing/Undressing Upper body dressing   What is the patient wearing?: Pull over shirt    Upper body assist Assist Level: Supervision/Verbal cueing    Lower Body Dressing/Undressing Lower body dressing      What is the patient wearing?: Pants     Lower body assist Assist for lower  body dressing: Contact Guard/Touching assist     Toileting Toileting    Toileting assist Assist for toileting: Contact Guard/Touching assist     Transfers Chair/bed transfer  Transfers assist     Chair/bed transfer assist level: Contact Guard/Touching assist     Locomotion Ambulation   Ambulation assist      Assist level: Minimal Assistance - Patient > 75% Assistive device: Walker-rolling Max distance: 158ft   Walk 10 feet activity   Assist     Assist level: Minimal Assistance - Patient > 75% Assistive device: Walker-rolling   Walk 50 feet activity   Assist Walk 50 feet with 2 turns activity did not occur: Safety/medical concerns  Assist level: Minimal Assistance - Patient > 75% Assistive device:  Walker-rolling    Walk 150 feet activity   Assist Walk 150 feet activity did not occur: Safety/medical concerns  Assist level: Minimal Assistance - Patient > 75% Assistive device: Walker-rolling    Walk 10 feet on uneven surface  activity   Assist     Assist level: Contact Guard/Touching assist Assistive device: Aeronautical engineer Will patient use wheelchair at discharge?: No             Wheelchair 50 feet with 2 turns activity    Assist            Wheelchair 150 feet activity     Assist          Blood pressure (!) 156/91, pulse (!) 48, temperature 98.2 F (36.8 C), temperature source Oral, resp. rate 18, height 5\' 8"  (1.727 m), weight 75.8 kg, SpO2 98 %.    Medical Problem List and Plan: 1.  R>L weakness and impaired gait secondary to HTN emergency and encephalopathy             -patient may  shower             -ELOS/Goals: 16-20 days- supervison  Continue CIR 2.  Antithrombotics: -DVT/anticoagulation:  Pharmaceutical: d/c Lovenox since ambulating >160 feet             -antiplatelet therapy: N/A 3. Right upper back myofascial pain: tylenol prn. Add heating pad. Discussed walking with shoes on hospital floors to minimize plantar fasciitis.  4. Mood: LCSW to follow for evaluation and support when appropriate             -antipsychotic agents: N/A 5. Neuropsych: This patient may be intermittently capable of making decisions on his own behalf. 6. Skin/Wound Care: Routine pressure-relief measures. May d/c IV 7. Fluids/Electrolytes/Nutrition: Monitor intake/output.  Offer supplements as needed Po intake. 8.  Hypertension: Monitor blood pressures 3 times daily.               --Continue hydralazine 25 mg 3 times daily,             --Monitor renal function with ACE back on board, continue Cozaar to 75mg .   -add flomax w/ supper 9.  Acute renal failure: normalized, discussed with patient.              --May see some elevation  due to IV dye for MRI with contrast 04/12 10.  Delirium: d/c seroquel 11.  T2DM: Monitor blood sugars ac/hs.               d/c ensure Max given sucralose.   --CBG improved to 76-181: encouraged low sugar diet.              --  continue Lantus to 32 units daily with SSI for elevated BS---titrate as indicated. 12.  B12 deficiency: Continue IM supplementation weekly. 13. Hyponatremia: Recheck in am.   14. Bradycardia to 47: d/c amlodipine.  15. Overweight BMI 25.42: d/c Ensure max 16. Urinary frequency: UC with no growth. Continue flomax at night. 17. Right sided plantar fasciitis- discussed with patient and OT and requested that he be given plantar fascia stretching exercises.   18. Anemia: hg 10.8, repeat weekly.   LOS: 8 days A FACE TO FACE EVALUATION WAS PERFORMED  Clide Deutscher Rainah Kirshner 11/16/2020, 12:25 PM

## 2020-11-17 LAB — GLUCOSE, CAPILLARY
Glucose-Capillary: 114 mg/dL — ABNORMAL HIGH (ref 70–99)
Glucose-Capillary: 173 mg/dL — ABNORMAL HIGH (ref 70–99)
Glucose-Capillary: 192 mg/dL — ABNORMAL HIGH (ref 70–99)
Glucose-Capillary: 61 mg/dL — ABNORMAL LOW (ref 70–99)
Glucose-Capillary: 75 mg/dL (ref 70–99)

## 2020-11-17 MED ORDER — INSULIN ASPART 100 UNIT/ML ~~LOC~~ SOLN
3.0000 [IU] | Freq: Three times a day (TID) | SUBCUTANEOUS | Status: DC
  Administered 2020-11-17 – 2020-11-18 (×2): 3 [IU] via SUBCUTANEOUS

## 2020-11-17 MED ORDER — INSULIN GLARGINE 100 UNIT/ML ~~LOC~~ SOLN
28.0000 [IU] | Freq: Every morning | SUBCUTANEOUS | Status: DC
  Administered 2020-11-18 – 2020-11-19 (×2): 28 [IU] via SUBCUTANEOUS
  Filled 2020-11-17 (×2): qty 0.28

## 2020-11-17 MED ORDER — TAMSULOSIN HCL 0.4 MG PO CAPS
0.8000 mg | ORAL_CAPSULE | Freq: Every day | ORAL | Status: DC
  Administered 2020-11-17 – 2020-11-18 (×2): 0.8 mg via ORAL
  Filled 2020-11-17 (×3): qty 2

## 2020-11-17 NOTE — Progress Notes (Signed)
Physical Therapy Session Note  Patient Details  Name: Caleb Dougherty MRN: [id number] Date of Birth: [birth year]  Today's Date: 11/17/2020 PT Individual Time: 1015-1109 and 1445-1525 PT Individual Time Calculation (min): 54 min and 40 min  Short Term Goals: Week 1:  PT Short Term Goal 1 (Week 1): Patient will completed bed mobility with CGA consistently PT Short Term Goal 2 (Week 1): Patient will transfer bed <> wc with LRAD and CGA consistently PT Short Term Goal 3 (Week 1): Patient will ambulate >21ft with LRAD and CGA  Skilled Therapeutic Interventions/Progress Updates:   Treatment Session 1: 1015-1109 54 min Received pt sitting in recliner, pt agreeable to therapy, and denied any pain during session. Session with emphasis on functional mobility/transfers, toileting, generalized strengthening, dynamic standing balance/coordination, gait training, NMR, and improved activity tolerance. Donned shoes with max A and ambulated 27ft with RW and supervision to bathroom. Pt able to manage clothing and void with close supervision. Pt stood at sink and washed hands with CGA. Pt transported to 57M ortho gym in [facility] total A for time management purposes. Pt performed ambulatory simulated car transfer with RW and CGA and ambulated 83ft on uneven surfaces (ramp) with RW and CGA with cues to keep RW on floor rather than picking it up. Pt ambulated 143ft without AD and min A. Pt demonstrated decreased bilateral step/stride length, narrow BOS, and decreased stance time on RLE requiring cues to increase step length. Worked on dynamic standing balance performing toe taps to 3 cones 2x3 trials on each LE with min handheld assist; multiple posterior LOB, pt compensating by leaning against mat table. Transitioned to standing ball toss against rebounder 2x20 reps with min/mod A for balance. Pt again with multiple posterior LOB initially but with increased repetition balance improved. Sit<>stand on Airex with UE support (unable  to perform without UE support) and min A x10 reps. Pt unsteady and required cues to decrease cadence and reposition feet. Pt transported back to room in [facility] total A and ambulated 54ft without AD and min A to recliner. Concluded session with pt sitting in recliner, needs within reach, and seatbelt alarm on. Provided fresh ice water to pt.   Treatment Session 2: 1445-1525 40 min Received pt sitting in recliner, pt agreeable to therapy, and denied any pain during session. Session with emphasis on functional mobility/transfers, generalized strengthening, dynamic standing balance/coordination, toileting, ambulation, and improved activity tolerance. Pt ambulated 101ft without AD and CGA to bathroom and able to manage clothing with CGA and void. Pt stood at sink and washed hands with CGA but when turning to go to [facility], pt with 2 posterior LOB requiring min A to correct. Pt transported to 4W dayroom in [facility] total A for time management purposes. Pt performed standing BLE strengthening on Kinetron at 30 cm/sec for 30 seconds x 4 trials with BUE support and min A for balance. Pt required cues for upright posture and for slow controlled movements with LEs. Worked on dynamic standing balance constructing picture with pipes with close supervision for balance with emphasis on fine motor control, problem solving, and spatial awareness. Pt required mod cues for sequencing when building due to impaired spatial awareness. Pt transported back to room in [facility] total A and ambulated 40ft without AD and CGA to recliner. Concluded session with pt sitting in recliner, needs within reach, and seatbelt alarm on.   Therapy Documentation Precautions:  Precautions Precautions: Fall Precaution Comments: HOH Restrictions Weight Bearing Restrictions: No  Therapy/Group: Individual Therapy Betsey Holiday  [place], DPT   11/17/2020, 7:24 AM

## 2020-11-17 NOTE — Progress Notes (Signed)
PROGRESS NOTE   Subjective/Complaints: Heel pain has improved CBG up to 391; appreciate diabetes coordinator recommendations.   ROS: + right sided upper back pain when ambulating with RW. Denies other sources of pain, denies constipation, +urinary frequency, denies dysuria, +right sided heel pain  Objective:   No results found. No results for input(s): WBC, HGB, HCT, PLT in the last 72 hours. Recent Labs    11/16/20 2205  GLUCOSE 391*    Intake/Output Summary (Last 24 hours) at 11/17/2020 1259 Last data filed at 11/17/2020 0715 Gross per 24 hour  Intake 460 ml  Output 150 ml  Net 310 ml        Physical Exam: Vital Signs Blood pressure (!) 150/72, pulse 93, temperature 98.3 F (36.8 C), temperature source Oral, resp. rate 14, height 5\' 8"  (1.727 m), weight 75.8 kg, SpO2 95 %. Gen: no distress, normal appearing HEENT: oral mucosa pink and moist, NCAT Cardio: Reg rate Chest: normal effort, normal rate of breathing Abd: soft, non-distended Ext: no edema Psych: pleasant, normal affect Skin: intact Musculoskeletal:     Cervical back: Normal range of motion and neck supple.     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  Shuffling gait- R hand held at side- HHA with L side LUE 5-/5 and RUE 4+/5- but just a trace difference between the 2 sides RLE- 4/5 and LLE 4+/5- but again very close, just R side very slightly decreased strength compared to L.   Tender to palpation over medial aspect of heel Skin:    Comments: Multiple healed scars on left temple from prior surgeries. Healed abrasions bilateral shins.  Wasn't able to see backside due to not able to turn to show buttocks. Heels OK B/L R forearm IV- OK  Neurological:     Comments: Decreased hearing with mild delay in processing. he was oriented to self, place, month, age, DOB, Pres. He was able to follow simple one and two step commands.   Somewhat  apraxic? Staggering gait Intact to light touch in all 4 extremities  Psychiatric:     Comments: Slowed/delayed responses   Assessment/Plan: 1. Functional deficits which require 3+ hours per day of interdisciplinary therapy in a comprehensive inpatient rehab setting.  Physiatrist is providing close team supervision and 24 hour management of active medical problems listed below.  Physiatrist and rehab team continue to assess barriers to discharge/monitor patient progress toward functional and medical goals  Care Tool:  Bathing    Body parts bathed by patient: Right arm,Left arm,Chest,Abdomen,Front perineal area,Buttocks,Right upper leg,Left upper leg,Right lower leg,Left lower leg,Face         Bathing assist Assist Level: Supervision/Verbal cueing     Upper Body Dressing/Undressing Upper body dressing   What is the patient wearing?: Pull over shirt    Upper body assist Assist Level: Supervision/Verbal cueing    Lower Body Dressing/Undressing Lower body dressing      What is the patient wearing?: Pants,Underwear/pull up     Lower body assist Assist for lower body dressing: Supervision/Verbal cueing     Toileting Toileting    Toileting assist Assist for toileting: Supervision/Verbal cueing     Transfers Chair/bed transfer  Transfers assist     Chair/bed transfer assist level: Supervision/Verbal cueing     Locomotion Ambulation   Ambulation assist      Assist level: Contact Guard/Touching assist Assistive device: Walker-rolling Max distance: 366ft   Walk 10 feet activity   Assist     Assist level: Contact Guard/Touching assist Assistive device: Walker-rolling   Walk 50 feet activity   Assist Walk 50 feet with 2 turns activity did not occur: Safety/medical concerns  Assist level: Contact Guard/Touching assist Assistive device: Walker-rolling    Walk 150 feet activity   Assist Walk 150 feet activity did not occur: Safety/medical  concerns  Assist level: Contact Guard/Touching assist Assistive device: Walker-rolling    Walk 10 feet on uneven surface  activity   Assist     Assist level: Contact Guard/Touching assist Assistive device: Aeronautical engineer Will patient use wheelchair at discharge?: No Type of Wheelchair: Manual    Wheelchair assist level: Supervision/Verbal cueing Max wheelchair distance: 151ft    Wheelchair 50 feet with 2 turns activity    Assist        Assist Level: Supervision/Verbal cueing   Wheelchair 150 feet activity     Assist          Blood pressure (!) 150/72, pulse 93, temperature 98.3 F (36.8 C), temperature source Oral, resp. rate 14, height 5\' 8"  (1.727 m), weight 75.8 kg, SpO2 95 %.    Medical Problem List and Plan: 1.  R>L weakness and impaired gait secondary to HTN emergency and encephalopathy             -patient may  shower             -ELOS/Goals: 16-20 days- supervison  Continue CIR 2.  Antithrombotics: -DVT/anticoagulation:  Pharmaceutical: d/c Lovenox since ambulating >160 feet             -antiplatelet therapy: N/A 3. Right upper back myofascial pain: tylenol prn. Add heating pad. Discussed walking with shoes on hospital floors to minimize plantar fasciitis.  4. Mood: LCSW to follow for evaluation and support when appropriate             -antipsychotic agents: N/A 5. Neuropsych: This patient may be intermittently capable of making decisions on his own behalf. 6. Skin/Wound Care: Routine pressure-relief measures. May d/c IV 7. Fluids/Electrolytes/Nutrition: Monitor intake/output.  Offer supplements as needed Po intake. 8.  Hypertension: Monitor blood pressures 3 times daily.               --Continue hydralazine 25 mg 3 times daily,             --Monitor renal function with ACE back on board, continue Cozaar to 75mg .   -add flomax w/ supper, increase to 0.8mg  9.  Acute renal failure: normalized, discussed with  patient.              --May see some elevation due to IV dye for MRI with contrast 04/12 10.  Delirium: d/c seroquel 11.  T2DM: Monitor blood sugars ac/hs.               d/c ensure Max given sucralose.   -- Encouraged low sugar diet.              --continue Lantus decreased to 28U daily  -novolog 3U started with meals.  12.  B12 deficiency: Continue IM supplementation weekly. 13. Hyponatremia: Recheck in am.   14. Bradycardia to 47: d/c amlodipine.  15. Overweight BMI 25.42: d/c Ensure max 16. Urinary frequency: UC with no growth. Continue flomax at night. Increase flomax to 0.8mg  17. Right sided plantar fasciitis- discussed with patient and OT and requested that he be given plantar fascia stretching exercises.   18. Anemia: hg 10.8, repeat weekly. 19. Disposition: d/c Saturday. F/u with me: 5/10 to arrive at 9:00 for 9:20.     LOS: 9 days A FACE TO FACE EVALUATION WAS PERFORMED  Caleb Dougherty Dajanae Brophy 11/17/2020, 12:59 PM

## 2020-11-17 NOTE — Progress Notes (Signed)
Speech Language Pathology Daily Session Note  Patient Details  Name: Caleb Dougherty MRN: [id number] Date of Birth: [birth year]  Today's Date: 11/17/2020 SLP Individual Time: 4098-1191 SLP Individual Time Calculation (min): 40 min  Short Term Goals: Week 2: SLP Short Term Goal 1 (Week 2): Pt will utilize external memory aids to recall new, daily information with Min A verbal and question cues. SLP Short Term Goal 2 (Week 2): Pt will orient to date and medical situation with use of calendar with min A verbal and visual cues. SLP Short Term Goal 3 (Week 2): Pt will complete basic problem solving tasks with Min A verbal and visual cues. SLP Short Term Goal 4 (Week 2): Pt will demonstrate intellectual awareness and identify 3 physical limitations with Mod A verbal and visual cues.  Skilled Therapeutic Interventions: Skilled SLP intervention focused on cognition. Pt oriented to date and day of the week for discharge with  no visual aids required. Calendar organization task with monthly calender completed with min to mod A. Pt required repetition of information due to being [facility] and needed increased time with processing to add in scheduled events on calendar. Pt identified 3 physical limitations with min-mod A with verbal cues. He became tearful and verbalized that he will need more time before he can drive and take care of his wife. Pt continues to require mod A verbal cues to [place] external aids to increase recall. Educated pt on additional memory strategies to follow outside of therapy. Cont with therapy per plan of care.      Pain Pain Assessment Pain Scale: Faces Faces Pain Scale: No hurt  Therapy/Group: Individual Therapy  Darrol Poke Shai Mckenzie 11/17/2020, 12:00 PM

## 2020-11-17 NOTE — Progress Notes (Signed)
Inpatient Diabetes Program Recommendations  AACE/ADA: New Consensus Statement on Inpatient Glycemic Control (2015)  Target Ranges:  Prepandial:   less than 140 mg/dL      Peak postprandial:   less than 180 mg/dL (1-2 hours)      Critically ill patients:  140 - 180 mg/dL   Lab Results  Component Value Date   GLUCAP 114 (H) 11/17/2020   HGBA1C 8.9 (H) 11/02/2020    Review of Glycemic Control Results for Caleb Dougherty, Caleb Dougherty (MRN [id number]) as of 11/17/2020 10:11  Ref. Range 11/16/2020 05:58 11/16/2020 11:30 11/16/2020 16:27 11/16/2020 20:49 11/17/2020 06:21  Glucose-Capillary Latest Ref Range: 70 - 99 mg/dL 76 109 (H) 272 (H) 417 (H) 114 (H)   Diabetes history:  DM2 Outpatient Diabetes medications:  Lantus 18 units daily, Metformin 1000 mg BID Current orders for Inpatient glycemic control:  Lantus 32 units daily, Novolog 0-9 units TID and 0-5 units QHS  Inpatient Diabetes Program Recommendations:    Lantus 28 units QAM as fasting CBG's are 76/114 mg/dL Novolog 3 units TID with meals if eats at least 50% as post prandials are elevated  Will continue to follow while inpatient.  Thank you, Reche Dixon, RN, BSN Diabetes Coordinator Inpatient Diabetes Program [phone] (team pager from 8a-5p)

## 2020-11-17 NOTE — Progress Notes (Signed)
Occupational Therapy Session Note  Patient Details  Name: Caleb Dougherty MRN: [id number] Date of Birth: 08/07/1928  Today's Date: 11/17/2020 OT Individual Time: 6256-3893 OT Individual Time Calculation (min): 53 min    Short Term Goals: Week 1:  OT Short Term Goal 1 (Week 1): Pt will don pants with CGA OT Short Term Goal 2 (Week 1): Pt will transfer to toilet with CGA OT Short Term Goal 3 (Week 1): Pt will demonstrate improved orientation with use of external cues if necessary OT Short Term Goal 4 (Week 1): Pt will require no more than min cueing for emergent awareness Week 2:     Skilled Therapeutic Interventions/Progress Updates:      Pt received in bed agreeable to OT. Pt requesting to shower and has no pain. Pt completes all transfers/ADLs with RW and supervision level. Pt requires VC for safe hand placement during transitional movement, sequencing for TTB transfers and cues to use stool for donning nonskid socks. Pt overall able to sequence familiar ADL tasks with no cuing but does require reminders for safety such as doffing shirt seated. Grooming completed in standing for activity tolernace.  Pt left at end of session in recliner with exit alarm on, call light in reach and all needs met    Therapy Documentation Precautions:  Precautions Precautions: Fall Precaution Comments: HOH Restrictions Weight Bearing Restrictions: No General:   Vital Signs: Therapy Vitals Temp: 98.3 F (36.8 C) Temp Source: Oral Pulse Rate: 93 Resp: 14 BP: (!) 150/72 Patient Position (if appropriate): Lying Oxygen Therapy SpO2: 95 % O2 Device: Room Air Pain:   ADL: ADL Eating: Modified independent Where Assessed-Eating: Bed level Grooming: Minimal assistance Where Assessed-Grooming: Standing at sink Upper Body Bathing: Supervision/safety Where Assessed-Upper Body Bathing: Sitting at sink Lower Body Bathing: Minimal assistance Where Assessed-Lower Body Bathing: Standing at  sink,Sitting at sink Upper Body Dressing: Supervision/safety Where Assessed-Upper Body Dressing: Sitting at sink Lower Body Dressing: Minimal assistance Where Assessed-Lower Body Dressing: Sitting at sink Toileting: Moderate assistance Where Assessed-Toileting: Toilet,Bedside Commode Toilet Transfer: Moderate assistance Toilet Transfer Method: Counselling psychologist: Grab bars,Bedside Personal assistant    Praxis   Exercises:   Other Treatments:     Therapy/Group: Individual Therapy  Tonny Branch 11/17/2020, 6:48 AM

## 2020-11-17 NOTE — Progress Notes (Signed)
Hypoglycemic Event  CBG: 61  Treatment: 8 oz juice/soda  Symptoms: None  Follow-up CBG: Time:2243 CBG Result:75  Possible Reasons for Event: Unknown  Comments/MD notified:    Beverley Fiedler

## 2020-11-18 DIAGNOSIS — E538 Deficiency of other specified B group vitamins: Secondary | ICD-10-CM

## 2020-11-18 DIAGNOSIS — E119 Type 2 diabetes mellitus without complications: Secondary | ICD-10-CM

## 2020-11-18 LAB — GLUCOSE, CAPILLARY
Glucose-Capillary: 130 mg/dL — ABNORMAL HIGH (ref 70–99)
Glucose-Capillary: 69 mg/dL — ABNORMAL LOW (ref 70–99)
Glucose-Capillary: 70 mg/dL (ref 70–99)
Glucose-Capillary: 302 mg/dL — ABNORMAL HIGH (ref 70–99)
Glucose-Capillary: 126 mg/dL — ABNORMAL HIGH (ref 70–99)

## 2020-11-18 MED ORDER — INSULIN GLARGINE 100 UNIT/ML SOLOSTAR PEN: 28.0000 [IU] | PEN_INJECTOR | Freq: Every day | SUBCUTANEOUS | 11 refills | Status: DC

## 2020-11-18 MED ORDER — CYANOCOBALAMIN 1000 MCG PO TABS: 1000.0000 ug | ORAL_TABLET | Freq: Every day | ORAL | 0 refills | Status: DC

## 2020-11-18 MED ORDER — TAMSULOSIN HCL 0.4 MG PO CAPS: 0.8000 mg | ORAL_CAPSULE | Freq: Every day | ORAL | 0 refills | Status: AC

## 2020-11-18 MED ORDER — CYANOCOBALAMIN 1000 MCG/ML IJ SOLN
1000.0000 ug | Freq: Once | INTRAMUSCULAR | Status: AC
  Administered 2020-11-19: 1000 ug via INTRAMUSCULAR
  Filled 2020-11-18: qty 1

## 2020-11-18 MED ORDER — LOSARTAN POTASSIUM 25 MG PO TABS: 75.0000 mg | ORAL_TABLET | Freq: Every day | ORAL | 0 refills | Status: DC

## 2020-11-18 MED ORDER — HYDRALAZINE HCL 25 MG PO TABS: 25.0000 mg | ORAL_TABLET | Freq: Three times a day (TID) | ORAL | 0 refills | Status: DC

## 2020-11-18 MED ORDER — INSULIN ASPART 100 UNIT/ML ~~LOC~~ SOLN
2.0000 [IU] | Freq: Three times a day (TID) | SUBCUTANEOUS | Status: DC
  Administered 2020-11-18 – 2020-11-19 (×3): 2 [IU] via SUBCUTANEOUS

## 2020-11-18 NOTE — Progress Notes (Signed)
Occupational Therapy Session Note  Patient Details  Name: Caleb Dougherty MRN: [id number] Date of Birth: [birth year]  Today's Date: 11/18/2020 OT Individual Time: 1430-1530 OT Individual Time Calculation (min): 60 min    Short Term Goals: Week 1:  OT Short Term Goal 1 (Week 1): Pt will don pants with CGA OT Short Term Goal 2 (Week 1): Pt will transfer to toilet with CGA OT Short Term Goal 3 (Week 1): Pt will demonstrate improved orientation with use of external cues if necessary OT Short Term Goal 4 (Week 1): Pt will require no more than min cueing for emergent awareness  Skilled Therapeutic Interventions/Progress Updates: Focus of session-Family education   1:1. Pt received in recliner agreeable to OT. Pt and son present. OT educates on supervision techniques, standing behind or beside patient, DME, shower transfers, safety awareness, RW management and bed mobility. Pt completes toileting and son hands off supervision of transfer to OT. OT reinforces that supervision for toileting is requrired till pt is seated despite privacy as pt often wants to leave RW behind. Son verbalized understanding. Pt and OT competes shower stall transfer with chair placed opposite of shower head. Pt with difficulty turning/stepping backwards once crossed threshold into shower requiring MIN A for post lean. Pt and son used BSC (family has BSC) positioned straight back into shower and pt able ot complete much safer. Pt practices bed mobility in regular bed with son with OT cuing to be beside pt instead of in front of pt as son would not be able to assist with LOB if occurred. Pt completes ambulation back to room for improved activity tolerance and pathfinding with mod cuing. Exited session with pt seated in recliner, exit alarm on and call light in reach   Therapy Documentation Precautions:  Precautions Precautions: Fall Precaution Comments: HOH Restrictions Weight Bearing Restrictions: No General:   Vital  Signs: Therapy Vitals Temp: 98.6 F (37 C) Temp Source: Oral Pulse Rate: (!) 48 Resp: 14 BP: (!) 148/72 Patient Position (if appropriate): Lying Oxygen Therapy SpO2: 96 % O2 Device: Room Air Pain:   ADL: ADL Eating: Modified independent Where Assessed-Eating: Bed level Grooming: Minimal assistance Where Assessed-Grooming: Standing at sink Upper Body Bathing: Supervision/safety Where Assessed-Upper Body Bathing: Sitting at sink Lower Body Bathing: Minimal assistance Where Assessed-Lower Body Bathing: Standing at sink,Sitting at sink Upper Body Dressing: Supervision/safety Where Assessed-Upper Body Dressing: Sitting at sink Lower Body Dressing: Minimal assistance Where Assessed-Lower Body Dressing: Sitting at sink Toileting: Moderate assistance Where Assessed-Toileting: Toilet,Bedside Commode Toilet Transfer: Moderate assistance Toilet Transfer Method: Counselling psychologist: Grab bars,Bedside Personal assistant    Praxis   Exercises:   Other Treatments:     Therapy/Group: Individual Therapy  Tonny Branch 11/18/2020, 6:51 AM

## 2020-11-18 NOTE — Progress Notes (Signed)
Physical Therapy Session Note  Patient Details  Name: Caleb Dougherty MRN: [id number] Date of Birth: 01/02/1929  Today's Date: 11/18/2020 PT Individual Time: 1000-1053 and 1300-1346 PT Individual Time Calculation (min): 53 min and 46 min PT Missed Time: 14 minutes  PT Missed Time Reason: fatigue   Short Term Goals: Week 1:  PT Short Term Goal 1 (Week 1): Patient will completed bed mobility with CGA consistently PT Short Term Goal 2 (Week 1): Patient will transfer bed <> wc with LRAD and CGA consistently PT Short Term Goal 3 (Week 1): Patient will ambulate >40ft with LRAD and CGA  Skilled Therapeutic Interventions/Progress Updates:   Treatment Session 1: 1000-1053 53 min Received pt sitting in recliner, pt agreeable to therapy, and denied any pain during session. Session with focus on discharge planning, functional mobility/transfers, generalized strengthening, dynamic standing balance/coordination, ambulation, toileting, and improved activity tolerance. Pt transferred recliner<>bed with RW and supervision and performed bed mobility mod I using bedrails. Donned shoes sitting EOB with max A and reported urge to use restroom. Pt ambulated 100ft with RW and supervision to bathroom with 1 posterior LOB but pt able to self-correct. Pt able to manage clothing, void, and perform peri-care with supervision. Stood at sink and washed hands with close supervision. Pt transported to therapy gym in [facility] total A for time management purposes and ambulated 132ft with RW and close supervision with reports of fatigue and weakness. Decreased cadence, narrow BOS, step to pattern, and decreased foot clearance noted. Pt required multiple rest/water breaks throughout session. Pt performed BUE/LE strengthening on Nustep at workload 5 for 8 minutes for a total of 399 steps for improved cardiovascular endurance. Pt transported back to room in [facility] total A and ambulated 39ft without AD and CGA to recliner. Concluded session with pt  sitting in recliner, needs within reach, and seatbelt alarm on.  Treatment Session 2: 6767-2094 46 min Received pt sitting in recliner with son present for family education training. Pt agreeable to therapy and denied any pain during session. Session with focus on discharge planning, functional mobility/transfers, generalized strengthening, dynamic standing balance/coordination, ambulation, simulated car transfers, stair navigation, and improved activity tolerance. Pt reported urge to use restroom and ambulated 74ft with RW and supervision to bathroom. Pt able to manage clothing, void, and perform peri-care with supervision. Stood at sink and washed hands with close supervision. Pt transported to therapy gym in [facility] total A for time management purposes and performed ambulatory simulated car transfer with RW and supervision provided by pt's son. Pt required cues for safe entry into car to avoid side stepping in. Pt then ambulated 59ft on uneven surfaces (ramp) with RW and CGA/supervision provided by pt's son. Pt transported to therapy gym and navigated 4 steps x 3 trials. Trial 1: with therapist, ascending and descending forwards with 2 rails and CGA/min A (ascending with step through and descending step to pattern). Trials 2/3 with pt's son providing assist. Trial 2 in same manner and Trial 3: using lateral stepping technique with BUE support on 1 rail to simulate son's home entry. Pt then ambulated 146ft with RW and supervision provided by pt's son and additional 68ft without AD and CGA/min A provided by therapist and 74ft with pt's son providing CGA/min A. Challenged pt with lateral head turns; resulting in mild LOB. Emphasized importance of using RW for safety upon D/C; both in agreement. Pt transported back to room in [facility] total A and ambulated 42ft with RW and supervision to recliner. Concluded session  with pt sitting in recliner, needs within reach, and seatbelt alarm on. Pt and pt's son with no further  questions regarding mobility. 14 minutes missed of skilled physical therapy due to fatigue.   Therapy Documentation Precautions:  Precautions Precautions: Fall Precaution Comments: HOH Restrictions Weight Bearing Restrictions: No  Therapy/Group: Individual Therapy Alfonse Alpers PT, DPT   11/18/2020, 7:24 AM

## 2020-11-18 NOTE — Progress Notes (Signed)
Occupational Therapy Discharge Summary  Patient Details  Name: Caleb Dougherty MRN: [id number] Date of Birth: [birth year]  Patient has met 7 of 7 long term goals due to improved activity tolerance, improved balance, postural control, ability to compensate for deficits, improved attention, improved awareness and improved coordination.  Patient to discharge at overall Supervision level.  Patient's care partner is independent to provide the necessary cognitive assistance at discharge.  Son [name] was present for family education and verbalized/demonstrated understanding of techniques.  Reasons goals not met: n/a  Recommendation:  Patient will benefit from ongoing skilled OT services in home health setting to continue to advance functional skills in the area of BADL and Reduce care partner burden.  Equipment: No equipment provided  Reasons for discharge: treatment goals met and discharge from hospital  Patient/family agrees with progress made and goals achieved: Yes  OT Discharge   Precautions/Restrictions Precautions Precautions: Fall Restrictions Weight Bearing Restrictions: No Cognition Overall Cognitive Status: Within Functional Limits for tasks assessed Arousal/Alertness: Awake/alert Orientation Level: Oriented X4 Memory: Impaired Awareness: Impaired Problem Solving: Impaired Safety/Judgment: Impaired Sensation Sensation Light Touch: Appears Intact Proprioception: Appears Intact Coordination Gross Motor Movements are Fluid and Coordinated: No Fine Motor Movements are Fluid and Coordinated: No Coordination and Movement Description: mild LE ataxia, generalized weakness and decreased endurance, and decreased balance/postural control Finger Nose Finger Test: WFL on RUE; dysmetria on LUE Heel Shin Test: [facility] but slow Motor  Motor Motor: Abnormal postural alignment and control;Ataxia Motor - Skilled Clinical Observations: mild LE ataxia, generalized weakness and decreased  endurance, and decreased balance/postural control  Mobility Bed Mobility Bed Mobility: Rolling Right;Rolling Left;Sit to Supine;Supine to Sit Rolling Right: Independent with assistive device Rolling Left: Independent with assistive device Supine to Sit: Independent with assistive device Sit to Supine: Independent with assistive device Transfers Transfers: Sit to Stand;Stand to Sit;Stand Pivot Transfers Sit to Stand: Supervision/Verbal cueing Stand to Sit: Supervision/Verbal cueing Stand Pivot Transfers: Supervision/Verbal cueing Stand Pivot Transfer Details: Verbal cues for safe use of DME/AE Stand Pivot Transfer Details (indicate cue type and reason): verbal cues for RW safety when turning Transfer (Assistive device): Rolling walker ADL Supervision for bathing at shower level, dressing sit to stand and ambulatory functional transfers Trunk/Postural Assessment  Cervical Assessment Cervical Assessment: Exceptions to [facility] (forward head) Thoracic Assessment Thoracic Assessment: Exceptions to [facility] (kyphosis) Lumbar Assessment Lumbar Assessment: Exceptions to [facility] (posterior pelvic tilt) Postural Control Postural Control: Deficits on evaluation  Balance Balance Balance Assessed: Yes Static Sitting Balance Static Sitting - Balance Support: Feet supported;No upper extremity supported Static Sitting - Level of Assistance: 7: Independent Dynamic Sitting Balance Dynamic Sitting - Balance Support: Feet supported;No upper extremity supported Dynamic Sitting - Level of Assistance: 6: Modified independent (Device/Increase time) Static Standing Balance Static Standing - Balance Support: Bilateral upper extremity supported (RW) Static Standing - Level of Assistance: 5: Stand by assistance (supervision) Dynamic Standing Balance Dynamic Standing - Balance Support: Bilateral upper extremity supported (RW) Dynamic Standing - Level of Assistance: 5: Stand by assistance (supervision) Extremity  Assessment  RLE Assessment RLE Assessment: Exceptions to [facility] General Strength Comments: grossly generalized to 4+/5 LLE Assessment LLE Assessment: Exceptions to [facility] General Strength Comments: grossly generalized to 4+/5    Tonny Branch 11/18/2020, 3:12 PM

## 2020-11-18 NOTE — Discharge Summary (Signed)
Physician Discharge Summary  Patient ID: Caleb Dougherty MRN: [id number] DOB/AGE: 11/27/1928 87 y.o.  Admit date: 11/08/2020 Discharge date: 11/19/2020  Discharge Diagnoses:  Principal Problem:   Encephalopathy Active Problems:   HTN (hypertension)   Vitamin B 12 deficiency   Type 2 diabetes mellitus (HCC)   Discharged Condition: stable   Significant Diagnostic Studies: N/A   Labs:  Basic Metabolic Panel: BMP Latest Ref Rng & Units 11/16/2020 11/14/2020 11/09/2020  Glucose 70 - 99 mg/dL 391(H) 78 153(H)  BUN 8 - 23 mg/dL - 32(H) 37(H)  Creatinine 0.61 - 1.24 mg/dL - 1.21 1.37(H)  Sodium 135 - 145 mmol/L - 137 135  Potassium 3.5 - 5.1 mmol/L - 4.1 4.1  Chloride 98 - 111 mmol/L - 104 102  CO2 22 - 32 mmol/L - 28 26  Calcium 8.9 - 10.3 mg/dL - 9.1 8.5(L)    CBC: CBC Latest Ref Rng & Units 11/14/2020 11/09/2020 11/08/2020  WBC 4.0 - 10.5 K/uL 7.2 7.9 8.8  Hemoglobin 13.0 - 17.0 g/dL 10.8(L) 10.8(L) 11.2(L)  Hematocrit 39.0 - 52.0 % 33.0(L) 33.2(L) 33.3(L)  Platelets 150 - 400 K/uL 286 209 183    CBG: Recent Labs  Lab 11/17/20 2243 11/18/20 0530 11/18/20 1158 11/18/20 1228 11/18/20 1637  GLUCAP 75 130* 69* 70 302*    Brief HPI:   Caleb Dougherty is a 87 y.o. male with history of T2DM, melanoma, HOH was admitted on 11/01/2020 after found by his son with left-sided weakness, leftward gaze, facial droop and global aphasia.  Family reported having fluctuation in cognition for 2 weeks since getting his booster at the [place] as well as being treated for UTI.  BP elevated at admission and he was started on IV nitroglycerin for hypertensive emergency.  Neurology was consulted for input and initiated full work-up which was  negative for acute stroke, encephalitis, seizures, CJD or other infectious cause.  He was found to have boderline low vitamin B12 level and was started on parenteral supplementation.  His mentation improved with addition of Seroquel to manage sundowning and speech  deficits resolved.  Mental status changes felt to be due to delirium and neurology signed off.  He did have worsening of renal status due to ACE and was treated with fluid boluses.  He continued to have limitations due to weakness, tremulousness, apraxia as well as cognitive deficits affecting mobility and ADLs.  CIR was recommended to functional decline.   Hospital Course: Caleb Dougherty was admitted to rehab 11/08/2020 for inpatient therapies to consist of PT, ST and OT at least three hours five days a week. Past admission physiatrist, therapy team and rehab RN have worked together to provide customized collaborative inpatient rehab.Blood pressures were monitored on TID basis and has been reasonably controlled. Urine culture was done due to urinary frequency and UA was negative.  Flomax was increased to 0.8 mg to help improve voiding.  He was maintained on B12 injections weekly with last dose administered on 04/22.  Serial CBC shows H&H to be relatively stable.  Check of electrolytes showed hyponatremia to have resolved an acute renal injury was resolving.   His diabetes has been monitored with ac/hs CBG checks and SSI was use prn for tighter BS control. Blood sugars have been reasonably controlled.  Mood has been stable with resolution of insomnia and Seroquel was d/c on 04/15.  He reported right plantar pain due to plantar fasciitis and he has been educated on plantar fascial stretching exercises to help with  symptoms.  His p.o. intake has been good and he is continent of bowel and bladder.  He has made good gains during his rehab stay and is currently at supervision level.  He will continue to receive follow-up home health PT, OT and ST by [facility].    Rehab course: During patient's stay in rehab weekly team conferences were held to monitor patient's progress, set goals and discuss barriers to discharge. At admission, patient required min assist with ADL tasks and with mobility.  He exhibited  moderate cognitive linguistic impairments and required increased time to express wants and needs. He  has had improvement in activity tolerance, balance, postural control as well as ability to compensate for deficits.  He is able to complete ADL tasks with supervision. He requires supervision with verbal cues for transfers and to ambulate 150 feet with rolling walker. He is able to recall information and daily events with contextual cues.  He requires supervision to min assist for safety, awareness, problem solving and for memory.  Family education was completed with son regarding all aspects of safety and mobility..  Disposition: Home  Diet: Low fat. Low cholesterol.   Special Instructions: 1. No driving or strenuous activity till cleared by MD. 2. Recommend follow up labs to check Vitamin B12 levels and BMET to monitor renal status.    Allergies as of 11/18/2020      Reactions   Gabapentin    Indomethacin    Other reaction(s): Edema of lower extremity   Lisinopril Cough   Nortriptyline    Other reaction(s): Edema of lower extremity   Empagliflozin    Other reaction(s): Urinary incontinence      Medication List    STOP taking these medications   Alogliptin Benzoate 12.5 MG Tabs   aspirin EC 81 MG tablet   donepezil 10 MG tablet Commonly known as: ARICEPT   metFORMIN 500 MG tablet Commonly known as: GLUCOPHAGE   QUEtiapine 25 MG tablet Commonly known as: SEROQUEL   simvastatin 80 MG tablet Commonly known as: ZOCOR     TAKE these medications   cyanocobalamin 1000 MCG tablet Take 1 tablet (1,000 mcg total) by mouth daily.   cyanocobalamin 1000 MCG/ML injection Commonly known as: (VITAMIN B-12) Inject 1 mL (1,000 mcg total) into the muscle once a week.   hydrALAZINE 25 MG tablet Commonly known as: APRESOLINE Take 1 tablet (25 mg total) by mouth every 8 (eight) hours.   insulin glargine 100 UNIT/ML Solostar Pen Commonly known as: LANTUS Inject 28 Units into the  skin daily. What changed: how much to take   losartan 25 MG tablet Commonly known as: COZAAR Take 3 tablets (75 mg total) by mouth daily. Start taking on: November 19, 2020 What changed:   medication strength  how much to take   tamsulosin 0.4 MG Caps capsule Commonly known as: FLOMAX Take 2 capsules (0.8 mg total) by mouth at bedtime. What changed: how much to take       Follow-up Information    Raulkar, Clide Deutscher, MD Follow up.   Specialty: Physical Medicine and Rehabilitation Why: 5/10 please arrive at 9:00 for 9:20 appointment   Contact information: [id number] [place] [address] [phone]        [name], MD. Call on 11/21/2020.   Specialty: Family Medicine Why: for post hospital follow up Contact information: [place] [address] [place] [address] [phone]  Signed: Bary Leriche 11/18/2020, 4:49 PM

## 2020-11-18 NOTE — Progress Notes (Signed)
Physical Therapy Discharge Summary  Patient Details  Name: Caleb Dougherty MRN: [id number] Date of Birth: [birth year]  Patient has met 8 of 8 long term goals due to improved activity tolerance, improved balance, improved postural control, increased strength, decreased pain, improved awareness and improved coordination. Patient to discharge at an ambulatory level Supervision.  Patient's care partner is independent to provide the necessary physical assistance at discharge. Pt's son attended family education training on 4/22 and verbalized and demonstrated confidence with all tasks to ensure safe discharge home.   All goals met  Recommendation:  Patient will benefit from ongoing skilled PT services in home health setting to continue to advance safe functional mobility, address ongoing impairments in transfers, generalized strengthening, dynamic standing balance/coordination, ambulation, endurance, and to minimize fall risk.  Equipment: RW  Reasons for discharge: treatment goals met  Patient/family agrees with progress made and goals achieved: Yes  PT Discharge Precautions/Restrictions Precautions Precautions: Fall Restrictions Weight Bearing Restrictions: No Cognition Overall Cognitive Status: Within Functional Limits for tasks assessed Arousal/Alertness: Awake/alert Orientation Level: Oriented X4 Memory: Impaired Awareness: Impaired Problem Solving: Impaired Safety/Judgment: Impaired Sensation Sensation Light Touch: Appears Intact Proprioception: Appears Intact Coordination Gross Motor Movements are Fluid and Coordinated: No Fine Motor Movements are Fluid and Coordinated: No Coordination and Movement Description: mild LE ataxia, generalized weakness and decreased endurance, and decreased balance/postural control Finger Nose Finger Test: WFL on RUE; dysmetria on LUE Heel Shin Test: [facility] but slow Motor  Motor Motor: Abnormal postural alignment and control;Ataxia Motor -  Skilled Clinical Observations: mild LE ataxia, generalized weakness and decreased endurance, and decreased balance/postural control  Mobility Bed Mobility Bed Mobility: Rolling Right;Rolling Left;Sit to Supine;Supine to Sit Rolling Right: Independent with assistive device Rolling Left: Independent with assistive device Supine to Sit: Independent with assistive device Sit to Supine: Independent with assistive device Transfers Transfers: Sit to Stand;Stand to Sit;Stand Pivot Transfers Sit to Stand: Supervision/Verbal cueing Stand to Sit: Supervision/Verbal cueing Stand Pivot Transfers: Supervision/Verbal cueing Stand Pivot Transfer Details: Verbal cues for safe use of DME/AE Stand Pivot Transfer Details (indicate cue type and reason): verbal cues for RW safety when turning Transfer (Assistive device): Rolling walker Locomotion  Gait Ambulation: Yes Gait Assistance: Supervision/Verbal cueing Gait Distance (Feet): 150 Feet Assistive device: Rolling walker Gait Assistance Details: Verbal cues for safe use of DME/AE Gait Assistance Details: verbal cues for RW safety Gait Gait: Yes Gait Pattern: Impaired Gait Pattern: Trunk flexed;Ataxic;Decreased stride length;Poor foot clearance - right;Poor foot clearance - left;Step-to pattern;Decreased step length - left;Decreased step length - right;Decreased dorsiflexion - right;Decreased dorsiflexion - left;Narrow base of support;Decreased trunk rotation Gait velocity: Decreased Stairs / Additional Locomotion Stairs: Yes Stairs Assistance: Minimal Assistance - Patient > 75% Stair Management Technique: Two rails Number of Stairs: 8 Height of Stairs: 6 Ramp: Contact Guard/touching assist (RW) Wheelchair Mobility Wheelchair Mobility: Yes Wheelchair Assistance: Chartered loss adjuster: Both upper extremities Wheelchair Parts Management: Needs assistance Distance: 174f  Trunk/Postural Assessment  Cervical  Assessment Cervical Assessment: Exceptions to [facility](forward head) Thoracic Assessment Thoracic Assessment: Exceptions to [facility](kyphosis) Lumbar Assessment Lumbar Assessment: Exceptions to [facility](posterior [facility] tilt) Postural Control Postural Control: Deficits on evaluation  Balance Balance Balance Assessed: Yes Static Sitting Balance Static Sitting - Balance Support: Feet supported;No upper extremity supported Static Sitting - Level of Assistance: 7: Independent Dynamic Sitting Balance Dynamic Sitting - Balance Support: Feet supported;No upper extremity supported Dynamic Sitting - Level of Assistance: 6: Modified independent (Device/Increase time) Static Standing Balance Static Standing - Balance  Support: Bilateral upper extremity supported (RW) Static Standing - Level of Assistance: 5: Stand by assistance (supervision) Dynamic Standing Balance Dynamic Standing - Balance Support: Bilateral upper extremity supported (RW) Dynamic Standing - Level of Assistance: 5: Stand by assistance (supervision) Extremity Assessment  RLE Assessment RLE Assessment: Exceptions to [facility] General Strength Comments: grossly generalized to 4+/5 LLE Assessment LLE Assessment: Exceptions to [facility] General Strength Comments: grossly generalized to 4+/5  Alfonse Alpers PT, DPT  11/18/2020, 7:32 AM

## 2020-11-18 NOTE — Progress Notes (Signed)
Inpatient Rehabilitation Care Coordinator Discharge Note  The overall goal for the admission was met for: Dc Sat 4/23  Discharge location: Yes-Home with wife and family assisting-24/7 supervision  Length of Stay: Yes-11 days  Discharge activity level: Yes-supervision level  Home/community participation: Yes  Services provided included: MD, RD, PT, OT, SLP, RN, CM, Pharmacy and SW  Financial Services: Private Insurance: [facility]  Choices offered to/list presented to:yes  Follow-up services arranged: [facility]-pt,ot,, DME: [facility]-rolling walker and Patient/Family has no preference for HH/DME agencies Also getting Speech and RN via [facility]  Comments (or additional information):Caleb Dougherty came in for education on Friday and it went well. Family is already providing care to pt's wife-mom. Aware of 24/7 supervision level at DC  Patient/Family verbalized understanding of follow-up arrangements: Yes  Individual responsible for coordination of the follow-up plan: Caleb Dougherty-son  Confirmed correct DME delivered: ,  G 11/18/2020    ,  G 

## 2020-11-18 NOTE — Progress Notes (Signed)
PROGRESS NOTE   Subjective/Complaints: Hypoglycemic this morning to 61- decrease Novolog to 2U.  He has no complaints.  Systolic better controlled but slightly elevated, pulse low  ROS: + right sided upper back pain when ambulating with RW. denies other sources of pain, denies constipation, +urinary frequency, denies dysuria, +right sided heel pain  Objective:   No results found. No results for input(s): WBC, HGB, HCT, PLT in the last 72 hours. Recent Labs    11/16/20 2205  GLUCOSE 391*    Intake/Output Summary (Last 24 hours) at 11/18/2020 1040 Last data filed at 11/18/2020 0539 Gross per 24 hour  Intake 612 ml  Output 1200 ml  Net -588 ml        Physical Exam: Vital Signs Blood pressure (!) 148/72, pulse (!) 48, temperature 98.6 F (37 C), temperature source Oral, resp. rate 14, height 5\' 8"  (1.727 m), weight 75.8 kg, SpO2 96 %. Gen: no distress, normal appearing HEENT: oral mucosa pink and moist, NCAT Cardio: Reg rate Chest: normal effort, normal rate of breathing Abd: soft, non-distended Ext: no edema Psych: pleasant, normal affect Musculoskeletal:     Cervical back: Normal range of motion and neck supple.     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  Shuffling gait- R hand held at side- HHA with L side LUE 5-/5 and RUE 4+/5- but just a trace difference between the 2 sides RLE- 4/5 and LLE 4+/5- but again very close, just R side very slightly decreased strength compared to L.   Tender to palpation over medial aspect of heel Skin:    Comments: Multiple healed scars on left temple from prior surgeries. Healed abrasions bilateral shins.  Wasn't able to see backside due to not able to turn to show buttocks. Heels OK B/L R forearm IV- OK  Neurological:     Comments: Decreased hearing with mild delay in processing. he was oriented to self, place, month, age, DOB, Pres. He was able to follow  simple one and two step commands.   Somewhat apraxic? Staggering gait Intact to light touch in all 4 extremities  Psychiatric:     Comments: Slowed/delayed responses   Assessment/Plan: 1. Functional deficits which require 3+ hours per day of interdisciplinary therapy in a comprehensive inpatient rehab setting.  Physiatrist is providing close team supervision and 24 hour management of active medical problems listed below.  Physiatrist and rehab team continue to assess barriers to discharge/monitor patient progress toward functional and medical goals  Care Tool:  Bathing    Body parts bathed by patient: Right arm,Left arm,Chest,Abdomen,Front perineal area,Buttocks,Right upper leg,Left upper leg,Right lower leg,Left lower leg,Face         Bathing assist Assist Level: Supervision/Verbal cueing     Upper Body Dressing/Undressing Upper body dressing   What is the patient wearing?: Pull over shirt    Upper body assist Assist Level: Supervision/Verbal cueing    Lower Body Dressing/Undressing Lower body dressing      What is the patient wearing?: Pants,Underwear/pull up     Lower body assist Assist for lower body dressing: Supervision/Verbal cueing     Toileting Toileting    Toileting assist Assist for toileting: Supervision/Verbal  cueing     Transfers Chair/bed transfer  Transfers assist     Chair/bed transfer assist level: Supervision/Verbal cueing     Locomotion Ambulation   Ambulation assist      Assist level: Contact Guard/Touching assist Assistive device: Walker-rolling Max distance: 376ft   Walk 10 feet activity   Assist     Assist level: Contact Guard/Touching assist Assistive device: Walker-rolling   Walk 50 feet activity   Assist Walk 50 feet with 2 turns activity did not occur: Safety/medical concerns  Assist level: Contact Guard/Touching assist Assistive device: Walker-rolling    Walk 150 feet activity   Assist Walk 150 feet  activity did not occur: Safety/medical concerns  Assist level: Contact Guard/Touching assist Assistive device: Walker-rolling    Walk 10 feet on uneven surface  activity   Assist     Assist level: Contact Guard/Touching assist Assistive device: Aeronautical engineer Will patient use wheelchair at discharge?: No Type of Wheelchair: Manual    Wheelchair assist level: Supervision/Verbal cueing Max wheelchair distance: 169ft    Wheelchair 50 feet with 2 turns activity    Assist        Assist Level: Supervision/Verbal cueing   Wheelchair 150 feet activity     Assist          Blood pressure (!) 148/72, pulse (!) 48, temperature 98.6 F (37 C), temperature source Oral, resp. rate 14, height 5\' 8"  (1.727 m), weight 75.8 kg, SpO2 96 %.    Medical Problem List and Plan: 1.  R>L weakness and impaired gait secondary to HTN emergency and encephalopathy             -patient may  shower             -ELOS/Goals: 16-20 days- supervison  D/c tomorrow.  2.  Antithrombotics: -DVT/anticoagulation:  Pharmaceutical: d/c Lovenox since ambulating >160 feet             -antiplatelet therapy: N/A 3. Right upper back myofascial pain: tylenol prn. Add heating pad. Discussed walking with shoes on hospital floors to minimize plantar fasciitis.  4. Mood: LCSW to follow for evaluation and support when appropriate             -antipsychotic agents: N/A 5. Neuropsych: This patient may be intermittently capable of making decisions on his own behalf. 6. Skin/Wound Care: Routine pressure-relief measures. May d/c IV 7. Fluids/Electrolytes/Nutrition: Monitor intake/output.  Offer supplements as needed Po intake. 8.  Hypertension: Monitor blood pressures 3 times daily.               --Continue hydralazine 25 mg 3 times daily,             --Monitor renal function with ACE back on board, continue Cozaar to 75mg .   -add flomax w/ supper, increase to 0.8mg  9.  Acute renal  failure: normalized, discussed with patient.              --May see some elevation due to IV dye for MRI with contrast 04/12 10.  Delirium: d/c seroquel 11.  T2DM: Monitor blood sugars ac/hs.               d/c ensure Max given sucralose.   -- Encouraged low sugar diet.              --continue Lantus decreased to 28U daily  -decrease novolog to 2U.  12.  B12 deficiency: Continue IM supplementation weekly. 13. Hyponatremia: Recheck in  am.   14. Bradycardia to 47: d/c amlodipine.  15. Overweight BMI 25.42: d/c Ensure max 16. Urinary frequency: UC with no growth. Continue flomax 0.8mg  17. Right sided plantar fasciitis- discussed with patient and OT and requested that he be given plantar fascia stretching exercises.   18. Anemia: hg 10.8, repeat weekly. 19. Bradycardia: monitor HR TID 20. Disposition: d/c Saturday. F/u with me: 5/10 to arrive at 9:00 for 9:20.     LOS: 10 days A FACE TO FACE EVALUATION WAS PERFORMED  Martha Clan P Nastasha Reising 11/18/2020, 10:40 AM

## 2020-11-18 NOTE — Discharge Instructions (Signed)
  Inpatient Rehab Discharge Instructions  SYAIRE SABER Discharge date and time: 11/19/20   Activities/Precautions/ Functional Status: Activity: no lifting, driving, or strenuous exercise till cleared by MD Diet: low fat, low cholesterol diet Wound Care: none needed    Functional status:  ___ No restrictions     ___ Walk up steps independently _X__ 24/7 supervision/assistance   ___ Walk up steps with assistance ___ Intermittent supervision/assistance  ___ Bathe/dress independently ___ Walk with walker     ___ Bathe/dress with assistance ___ Walk Independently    ___ Shower independently ___ Walk with assistance    _X__ Shower with assistance _X__ No alcohol     ___ Return to work/school ________   Special Instructions: 1. Monitor blood sugars twice a day before meals. Take reading with you to PCP appointment     REFERRALS UPON DISCHARGE:    Home Health:   PT, OT, SP, RN                 [facility] [employer] [place]                                                 Agency/Supplier:[facility]  [phone]    My questions have been answered and I understand these instructions. I will adhere to these goals and the provided educational materials after my discharge from the hospital.  Patient/Caregiver Signature _______________________________ Date __________  Clinician Signature _______________________________________ Date __________  Please bring this form and your medication list with you to all your follow-up doctor's appointments. COMMUNITY

## 2020-11-18 NOTE — Progress Notes (Signed)
Speech Language Pathology Discharge Summary  Patient Details  Name: Caleb Dougherty MRN: [id number] Date of Birth: 01-17-1929  Today's Date: 11/18/2020 SLP Individual Time: 1165-7903 SLP Individual Time Calculation (min): 40 min   Skilled Therapeutic Interventions:  Patient seen for skilled ST session with focus on cognitive goals. Patient was oriented to date, recalled room number as "7" and was aware of planned discharge tomorrow. He continues with difficulty in fully demonstrating awareness to deficits as well as being able to demonstrate adequate anticipatory awareness. He is able to recall information and daily events in general terms when given context cues. Patient is planned for discharge home with family next date.    Patient has met 6 of 6 long term goals.  Patient to discharge at overall Supervision;Min level.  Reasons goals not met: N/A   Clinical Impression/Discharge Summary: Patient made good progress and met all 6 LTG's and at time of discharge, is functioning at a supervision to minA level for safety, awareness, problem solving with basic ADL's. He continues to require consistent assistance and cues for using memory strategies and demonstrating recall, but he is able to recall general information and events with context cues. SLP is recommending [place] ST to continue with progress and maximize safety, problem solving, reasoning within his home. Family education was completed with patient's son.  Care Partner:  Caregiver Able to Provide Assistance: Yes  Type of Caregiver Assistance: Physical;Cognitive  Recommendation:  Home Health SLP  Rationale for SLP Follow Up: Reduce caregiver burden;Maximize cognitive function and independence   Equipment: N/A for ST   Reasons for discharge: Discharged from hospital   Patient/Family Agrees with Progress Made and Goals Achieved: Yes   Sonia Baller, MA, CCC-SLP Speech Therapy

## 2020-11-19 LAB — GLUCOSE, CAPILLARY
Glucose-Capillary: 93 mg/dL (ref 70–99)
Glucose-Capillary: 159 mg/dL — ABNORMAL HIGH (ref 70–99)

## 2020-11-19 NOTE — Progress Notes (Signed)
PROGRESS NOTE   Subjective/Complaints: No new complaints. Denies pain today  ROS: Patient denies fever, rash, sore throat, blurred vision, nausea, vomiting, diarrhea, cough, shortness of breath or chest pain,  headache, or mood change.    Objective:   No results found. No results for input(s): WBC, HGB, HCT, PLT in the last 72 hours. Recent Labs    11/16/20 2205  GLUCOSE 391*    Intake/Output Summary (Last 24 hours) at 11/19/2020 0816 Last data filed at 11/19/2020 0710 Gross per 24 hour  Intake 718 ml  Output 1200 ml  Net -482 ml        Physical Exam: Vital Signs Blood pressure (!) 142/57, pulse 62, temperature 98.4 F (36.9 C), temperature source Oral, resp. rate 14, height 5\' 8"  (1.727 m), weight 75.8 kg, SpO2 96 %. Constitutional: No distress . Vital signs reviewed. HEENT: EOMI, oral membranes moist Neck: supple Cardiovascular: RRR without murmur. No JVD    Respiratory/Chest: CTA Bilaterally without wheezes or rales. Normal effort    GI/Abdomen: BS +, non-tender, non-distended Ext: no clubbing, cyanosis, or edema Psych: pleasant and cooperative Musculoskeletal:     Cervical back: Normal range of motion and neck supple.     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   LUE 5-/5 and RUE 4+/5 RLE- 4/5 and LLE 4+/5 Tender to palpation over medial aspect of heel Skin:    Comments: old scars Heels OK B/L R forearm IV- OK  Neurological:     Comments: Decreased hearing with mild delay in processing. he was oriented to self, place, month, age, DOB, Pres. He was able to follow simple one and two step commands.  Intact to light touch in all 4 extremities     Assessment/Plan: 1. Functional deficits which require 3+ hours per day of interdisciplinary therapy in a comprehensive inpatient rehab setting.  Physiatrist is providing close team supervision and 24 hour management of active medical problems  listed below.  Physiatrist and rehab team continue to assess barriers to discharge/monitor patient progress toward functional and medical goals  Care Tool:  Bathing    Body parts bathed by patient: Right arm,Left arm,Chest,Abdomen,Front perineal area,Buttocks,Right upper leg,Left upper leg,Right lower leg,Left lower leg,Face         Bathing assist Assist Level: Supervision/Verbal cueing     Upper Body Dressing/Undressing Upper body dressing   What is the patient wearing?: Pull over shirt    Upper body assist Assist Level: Supervision/Verbal cueing    Lower Body Dressing/Undressing Lower body dressing      What is the patient wearing?: Pants,Underwear/pull up     Lower body assist Assist for lower body dressing: Supervision/Verbal cueing     Toileting Toileting    Toileting assist Assist for toileting: Supervision/Verbal cueing     Transfers Chair/bed transfer  Transfers assist     Chair/bed transfer assist level: Supervision/Verbal cueing     Locomotion Ambulation   Ambulation assist      Assist level: Supervision/Verbal cueing Assistive device: Walker-rolling Max distance: 145ft   Walk 10 feet activity   Assist     Assist level: Supervision/Verbal cueing Assistive device: Walker-rolling   Walk 50 feet activity  Assist Walk 50 feet with 2 turns activity did not occur: Safety/medical concerns  Assist level: Supervision/Verbal cueing Assistive device: Walker-rolling    Walk 150 feet activity   Assist Walk 150 feet activity did not occur: Safety/medical concerns  Assist level: Supervision/Verbal cueing Assistive device: Walker-rolling    Walk 10 feet on uneven surface  activity   Assist     Assist level: Contact Guard/Touching assist Assistive device: Aeronautical engineer Will patient use wheelchair at discharge?: No Type of Wheelchair: Manual    Wheelchair assist level: Supervision/Verbal  cueing Max wheelchair distance: 152ft    Wheelchair 50 feet with 2 turns activity    Assist        Assist Level: Supervision/Verbal cueing   Wheelchair 150 feet activity     Assist      Assist Level: Moderate Assistance - Patient 50 - 74%   Blood pressure (!) 142/57, pulse 62, temperature 98.4 F (36.9 C), temperature source Oral, resp. rate 14, height 5\' 8"  (1.727 m), weight 75.8 kg, SpO2 96 %.    Medical Problem List and Plan: 1.  R>L weakness and impaired gait secondary to HTN emergency and encephalopathy               Dc today  -Patient to see MD in the office for transitional care encounter in 1-2 weeks.  2.  Antithrombotics: -DVT/anticoagulation:  Pharmaceutical: d/c Lovenox since ambulating >160 feet             -antiplatelet therapy: N/A 3. Right upper back myofascial pain: tylenol prn. Add heating pad. Discussed walking with shoes on hospital floors to minimize plantar fasciitis.  4. Mood: LCSW to follow for evaluation and support when appropriate             -antipsychotic agents: N/A 5. Neuropsych: This patient may be intermittently capable of making decisions on his own behalf. 6. Skin/Wound Care: Routine pressure-relief measures. May d/c IV 7. Fluids/Electrolytes/Nutrition: Monitor intake/output.  Offer supplements as needed Po intake. 8.  Hypertension: Monitor blood pressures 3 times daily.               --Continue hydralazine 25 mg 3 times daily,             --Monitor renal function with ACE back on board, continue Cozaar to 75mg .   -continue flomax w/ supper 0.8mg  9.  Acute renal failure: normalized, discussed with patient.              --May see some elevation due to IV dye for MRI with contrast 04/12 10.  Delirium: d/c seroquel 11.  T2DM: Monitor blood sugars ac/hs.               d/c ensure Max given sucralose.   -- Encouraged low sugar diet.              --continue Lantus decreased to 28U daily  -decreased novolog to 2U.  -sugars still  labile--will need follow up and monitoring at home  12.  B12 deficiency: Continue IM supplementation weekly. 13. Hyponatremia: Recheck in am.   14. Bradycardia to 47: d/c amlodipine.  15. Overweight BMI 25.42: d/c Ensure max 16. Urinary frequency: UC with no growth. Continue flomax 0.8mg  17. Right sided plantar fasciitis- discussed with patient and OT and requested that he be given plantar fascia stretching exercises.   18. Anemia: hg 10.8, repeat weekly. 19. Bradycardia: monitor HR TID 20. F/u Dr. Ranell Patrick: 5/10 to arrive  at 9:00 for 9:20.     LOS: 11 days A FACE TO FACE EVALUATION WAS PERFORMED  Meredith Staggers 11/19/2020, 8:16 AM

## 2020-11-21 LAB — MISC LABCORP TEST (SEND OUT): Labcorp test code: 9985

## 2020-12-06 ENCOUNTER — Encounter: Payer: Self-pay | Admitting: Physical Medicine and Rehabilitation

## 2020-12-06 ENCOUNTER — Encounter
Payer: Medicare Other | Attending: Physical Medicine and Rehabilitation | Admitting: Physical Medicine and Rehabilitation

## 2020-12-06 ENCOUNTER — Other Ambulatory Visit: Payer: Self-pay

## 2020-12-06 VITALS — BP 181/73 | HR 86 | Temp 99.0°F | Ht 68.0 in | Wt 175.2 lb

## 2020-12-06 DIAGNOSIS — N179 Acute kidney failure, unspecified: Secondary | ICD-10-CM | POA: Diagnosis present

## 2020-12-06 DIAGNOSIS — I639 Cerebral infarction, unspecified: Secondary | ICD-10-CM | POA: Diagnosis present

## 2020-12-06 NOTE — Progress Notes (Signed)
Subjective:    Patient ID: Caleb Dougherty, male    DOB: 10-02-1928, 87 y.o.   MRN: [id number]  HPI: Caleb Dougherty is an 87 year old man who presents for hospital follow-up after CVA.  1) Ischemic CVA: -working on walking with therapy -PT is going well  2) HTN: -doubled hydralazine yesterday.  -BP is 181/73 -highest 223/93. -he has been following with cardiology.   3) Skin tear -left arm  4) Type 2 DM: -CBGs were elevated initially at home -CBGs have been 90s-200s.   Pain Inventory Average Pain 0 Pain Right Now 0 My pain is No issues per patient  LOCATION OF PAIN  No pain BOWEL Number of stools per week: 2-3 week Oral laxative use No  Type of laxative none Enema or suppository use No  History of colostomy No  Incontinent No   BLADDER Pads In and out cath, frequency n/a Able to self cath No  Bladder incontinence Yes  Frequent urination No  Leakage with coughing No  Difficulty starting stream Yes  Incomplete bladder emptying unknown   Mobility how many minutes can you walk? unknown ability to climb steps?  yes do you drive?  no Do you have any goals in this area?  yes  Function retired I need assistance with the following:  meal prep, household duties and shopping Do you have any goals in this area?  yes  Neuro/Psych bladder control problems bowel control problems weakness trouble walking  Prior Studies Any changes since last visit?  no New Patient  Physicians involved in your care Any changes since last visit?  yes New Patient   Family History  Problem Relation Age of Onset  . Diabetes Mother   . Cancer Father   . Cancer Sister   . Other Sister        question of ALS   Social History   Socioeconomic History  . Marital status: Married    Spouse name: Not on file  . Number of children: Not on file  . Years of education: Not on file  . Highest education level: Not on file  Occupational History  . Not on file  Tobacco Use  .  Smoking status: Never Smoker  . Smokeless tobacco: Never Used  Vaping Use  . Vaping Use: Never used  Substance and Sexual Activity  . Alcohol use: No  . Drug use: No  . Sexual activity: Yes  Other Topics Concern  . Not on file  Social History Narrative  . Not on file   Social Determinants of Health   Financial Resource Strain: Not on file  Food Insecurity: Not on file  Transportation Needs: Not on file  Physical Activity: Not on file  Stress: Not on file  Social Connections: Not on file   Past Surgical History:  Procedure Laterality Date  . CARPAL TUNNEL RELEASE Right 06/23/2013   Procedure: RIGHT CARPAL TUNNEL RELEASE;  Surgeon: Cammie Sickle., MD;  Location: [place];  Service: Orthopedics;  Laterality: Right;  . CATARACT EXTRACTION Right   . CATARACT EXTRACTION W/PHACO Left 03/30/2019   Procedure: CATARACT EXTRACTION PHACO AND INTRAOCULAR LENS PLACEMENT (IOC) LEFT DIABETES  02:21.8  19.2%  28.18;  Surgeon: Eulogio Bear, MD;  Location: [place];  Service: Ophthalmology;  Laterality: Left;  diabetic - insulin  . COLONOSCOPY    . INCISION AND DRAINAGE WOUND WITH FOREIGN BODY REMOVAL Left 12/07/2014   Procedure: INCISION AND DRAINAGE WOUND WITH FOREIGN BODY  REMOVAL;  Surgeon: Corky Mull, MD;  Location: [facility];  Service: Orthopedics;  Laterality: Left;  . REVERSE SHOULDER ARTHROPLASTY Left 11/19/2014   Procedure: LEFT REVERSE SHOULDER ARTHROPLASTY;  Surgeon: Netta Cedars, MD;  Location: [place];  Service: Orthopedics;  Laterality: Left;  . TOTAL KNEE ARTHROPLASTY Left 02/23/2014   Procedure: LEFT TOTAL KNEE REPLACEMENT;  Surgeon: Mauri Pole, MD;  Location: [facility];  Service: Orthopedics;  Laterality: Left;  . TOTAL KNEE ARTHROPLASTY Right 05/25/2014   Procedure: RIGHT TOTAL KNEE ARTHROPLASTY;  Surgeon: Mauri Pole, MD;  Location: [facility];  Service: Orthopedics;  Laterality: Right;   Past Medical History:  Diagnosis Date  . Arthritis     osteoarthritis-knees and feet  . BPH (benign prostatic hypertrophy)   . Cancer [facility])    melanoma(back)-no futher issues. Skin cancers face "frozen"  . Cataract    02-12-14 left- ready for surgery, but only has had right done  . Diabetes mellitus without complication ([place])    diabetes x4 yrs-oral med and Lantus used  . HOH (hard of hearing)    wears bilateral hearing aids  . Hypertension   . Impaired hearing    hard of hearing -no hearing aids  . Urgency of urination   . Wears dentures    full lower  . Wears glasses    reading   There were no vitals taken for this visit.  Opioid Risk Score:   Fall Risk Score:  `1  Depression screen PHQ 2/9  No flowsheet data found. Review of Systems  Cardiovascular: Positive for leg swelling.  Gastrointestinal: Positive for constipation.  Genitourinary:       Disposable diaper  Musculoskeletal: Positive for gait problem.  Skin: Positive for wound.       Skin tear on left arm  Neurological: Positive for tremors and weakness.  All other systems reviewed and are negative.      Objective:   Physical Exam Gen: no distress, normal appearing HEENT: oral mucosa pink and moist, NCAT Cardio: Reg rate Chest: normal effort, normal rate of breathing Abd: soft, non-distended Ext: no edema Psych: pleasant, normal affect Skin: Skin tear of left arm Neuro: Alert and oriented. Hard of hearing. +tremor Musculoskeletal: 5/5 strength throughout    Assessment & Plan:  1) HTN: -BP is 181/73today.  -Advised checking BP daily at home and logging results to bring into follow-up appointment with her PCP and myself. -Reviewed BP meds today.  -Hydralazine was increased yesterday- advised to allow 2 days to see effects of this change -I will call him tomorrow to check how Bps were today- he has been checking 2-3 times per day at home.  -Advised regarding healthy foods that can help lower blood pressure and provided with a list: 1) citrus foods- high in  vitamins and minerals 2) salmon and other fatty fish - reduces inflammation and oxylipins 3) swiss chard (leafy green)- high level of nitrates 4) pumpkin seeds- one of the best natural sources of magnesium 5) Beans and lentils- high in fiber, magnesium, and potassium 6) Berries- high in flavonoids 7) Amaranth (whole grain, can be cooked similarly to rice and oats)- high in magnesium and fiber 8) Pistachios- even more effective at reducing BP than other nuts 9) Carrots- high in phenolic compounds that relax blood vessels and reduce inflammation 10) Celery- contain phthalides that relax tissues of arterial walls 11) Tomatoes- can also improve cholesterol and reduce risk of heart disease 12) Broccoli- good source of magnesium, calcium, and potassium  13) Greek yogurt: high in potassium and calcium 14) Herbs and spices: Celery seed, cilantro, saffron, lemongrass, black cumin, ginseng, cinnamon, cardamom, sweet basil, and ginger 15) Chia and flax seeds- also help to lower cholesterol and blood sugar 16) Beets- high levels of nitrates that relax blood vessels  17) spinach and bananas- high in potassium -Educated that goal BP is 120/80. -Made goal to incorporate some of the above foods into diet.   2) Diabetes: -check CBGs daily, log, and bring log to follow-up appointment -avoid sugar, bread, pasta, rice -avoid snacking -try to incorporate into your diet some of the following foods which are good for diabetes:  3) Encephalopathy: -reviewed notes and dicussed potential etiologies  4) AKI: -repeat Cr today

## 2020-12-06 NOTE — Patient Instructions (Signed)
HTN: 1) citrus foods- high in vitamins and minerals 2) salmon and other fatty fish - reduces inflammation and oxylipins 3) swiss chard (leafy green)- high level of nitrates 4) pumpkin seeds- one of the best natural sources of magnesium 5) Beans and lentils- high in fiber, magnesium, and potassium 6) Berries- high in flavonoids 7) Amaranth (whole grain, can be cooked similarly to rice and oats)- high in magnesium and fiber 8) Pistachios- even more effective at reducing BP than other nuts 9) Carrots- high in phenolic compounds that relax blood vessels and reduce inflammation 10) Celery- contain phthalides that relax tissues of arterial walls 11) Tomatoes- can also improve cholesterol and reduce risk of heart disease 12) Broccoli- good source of magnesium, calcium, and potassium 13) Greek yogurt: high in potassium and calcium 14) Herbs and spices: Celery seed, cilantro, saffron, lemongrass, black cumin, ginseng, [place] cinnamon, cardamom, sweet basil, and ginger 15) Chia and flax seeds- also help to lower cholesterol and blood sugar 16) Beets- high levels of nitrates that relax blood vessels  17) spinach and bananas- high in potassium -Educated that goal BP is 120/80. -Made goal to incorporate some of the above foods into diet.

## 2020-12-07 LAB — BASIC METABOLIC PANEL
BUN/Creatinine Ratio: 25 — ABNORMAL HIGH (ref 10–24)
BUN: 31 mg/dL — ABNORMAL HIGH (ref 8–27)
CO2: 21 mmol/L (ref 20–29)
Calcium: 9.1 mg/dL (ref 8.6–10.2)
Chloride: 107 mmol/L — ABNORMAL HIGH (ref 96–106)
Creatinine, Ser: 1.25 mg/dL (ref 0.76–1.27)
Glucose: 67 mg/dL (ref 65–99)
Potassium: 3.9 mmol/L (ref 3.5–5.2)
Sodium: 144 mmol/L (ref 134–144)
eGFR: 55 mL/min/{1.73_m2} — ABNORMAL LOW (ref >59)

## 2020-12-11 ENCOUNTER — Other Ambulatory Visit: Payer: Self-pay | Admitting: Physical Medicine and Rehabilitation

## 2020-12-12 ENCOUNTER — Other Ambulatory Visit: Payer: Self-pay | Admitting: Physical Medicine and Rehabilitation

## 2021-01-03 ENCOUNTER — Encounter: Payer: Self-pay | Admitting: Urology

## 2021-01-03 ENCOUNTER — Ambulatory Visit (INDEPENDENT_AMBULATORY_CARE_PROVIDER_SITE_OTHER): Payer: Medicare Other | Admitting: Urology

## 2021-01-03 ENCOUNTER — Other Ambulatory Visit: Payer: Self-pay

## 2021-01-03 VITALS — BP 121/53 | HR 43 | Ht 68.0 in | Wt 186.0 lb

## 2021-01-03 DIAGNOSIS — R3913 Splitting of urinary stream: Secondary | ICD-10-CM

## 2021-01-03 DIAGNOSIS — R3981 Functional urinary incontinence: Secondary | ICD-10-CM | POA: Diagnosis not present

## 2021-01-03 DIAGNOSIS — R39198 Other difficulties with micturition: Secondary | ICD-10-CM

## 2021-01-03 DIAGNOSIS — N4889 Other specified disorders of penis: Secondary | ICD-10-CM

## 2021-01-03 LAB — MICROSCOPIC EXAMINATION

## 2021-01-03 LAB — URINALYSIS, COMPLETE
Bilirubin, UA: NEGATIVE
Glucose, UA: NEGATIVE
Ketones, UA: NEGATIVE
Leukocytes,UA: NEGATIVE
Nitrite, UA: NEGATIVE
Specific Gravity, UA: 1.01 (ref 1.005–1.030)
Urobilinogen, Ur: 0.2 mg/dL (ref 0.2–1.0)
pH, UA: 5 (ref 5.0–7.5)

## 2021-01-03 LAB — BLADDER SCAN AMB NON-IMAGING

## 2021-01-03 NOTE — Patient Instructions (Signed)
Richard ice

## 2021-01-03 NOTE — Progress Notes (Signed)
01/03/2021 11:32 AM   Caleb Dougherty 08/02/1928 [id number]  Referring provider: [name], MD [address] [facility] [place],  [place] [address]  Chief Complaint  Patient presents with  . New Patient (Initial Visit)    HPI: 87 year old male with multiple medical comorbidities including a personal history of diabetes, melanoma with recent admission for left-sided weakness who presents today for worsening urinary symptoms.  Notably, in admission was followed by prolonged rehab admission.  He has been followed as an outpatient by cardiology with titration in his diuretics to help with fluid overload.  He recently an echo which was fairly unremarkable.  He is seeing Dr. Saralyn Pilar showed next week.  He presumably has underlying enlarged prostate and has been on Flomax for quite some time.    He also has a personal history of urinary tract infection x1 treated in April 2022 by the [place] but has not had a recurrence of this nor previous history of recurrent UTIs.  Patient's history is provided primarily by his son today who accompanies him.  Both the patient and his son report that during his hospital admission, he was catheterized several times via INO cath for some urinary retention.  Patient is adamant that he was cut or something was done to him during that hospital admission that led to his current condition however the son today states that he was quite delirious during this admission and has a contrasting story.  Starting Friday especially, the patient's developed worsening penile and suprapubic edema.  He also reports worsened urinary incontinence, now having to wear pull-ups and when he does urinate, he cannot name his stream, sprays everywhere and has dribbling.  Prior to this admission, he denies any significant urinary symptoms although this history is somewhat limited.  His son reports that he has gained about 20 pounds since his discharge.    IPSS    Row Name  01/03/21 1100         International Prostate Symptom Score   How often have you had the sensation of not emptying your bladder? Not at All     How often have you had to urinate less than every two hours? Less than half the time     How often have you found you stopped and started again several times when you urinated? More than half the time     How often have you found it difficult to postpone urination? Almost always     How often have you had a weak urinary stream? More than half the time     How often have you had to strain to start urination? Less than half the time     How many times did you typically get up at night to urinate? 3 Times     Total IPSS Score 20           Quality of Life due to urinary symptoms   If you were to spend the rest of your life with your urinary condition just the way it is now how would you feel about that? Terrible            Score:  1-7 Mild 8-19 Moderate 20-35 Severe   PMH: Past Medical History:  Diagnosis Date  . Arthritis    osteoarthritis-knees and feet  . BPH (benign prostatic hypertrophy)   . Cancer [facility])    melanoma(back)-no futher issues. Skin cancers face "frozen"  . Cataract    02-12-14 left- ready for surgery,  but only has had right done  . Diabetes mellitus without complication ([place])    diabetes x4 yrs-oral med and Lantus used  . HOH (hard of hearing)    wears bilateral hearing aids  . Hypertension   . Impaired hearing    hard of hearing -no hearing aids  . Urgency of urination   . Wears dentures    full lower  . Wears glasses    reading    Surgical History: Past Surgical History:  Procedure Laterality Date  . CARPAL TUNNEL RELEASE Right 06/23/2013   Procedure: RIGHT CARPAL TUNNEL RELEASE;  Surgeon: Cammie Sickle., MD;  Location: [place];  Service: Orthopedics;  Laterality: Right;  . CATARACT EXTRACTION Right   . CATARACT EXTRACTION W/PHACO Left 03/30/2019   Procedure: CATARACT EXTRACTION  PHACO AND INTRAOCULAR LENS PLACEMENT (IOC) LEFT DIABETES  02:21.8  19.2%  28.18;  Surgeon: Eulogio Bear, MD;  Location: [place];  Service: Ophthalmology;  Laterality: Left;  diabetic - insulin  . COLONOSCOPY    . INCISION AND DRAINAGE WOUND WITH FOREIGN BODY REMOVAL Left 12/07/2014   Procedure: INCISION AND DRAINAGE WOUND WITH FOREIGN BODY REMOVAL;  Surgeon: Corky Mull, MD;  Location: [facility];  Service: Orthopedics;  Laterality: Left;  . REVERSE SHOULDER ARTHROPLASTY Left 11/19/2014   Procedure: LEFT REVERSE SHOULDER ARTHROPLASTY;  Surgeon: Netta Cedars, MD;  Location: [place];  Service: Orthopedics;  Laterality: Left;  . TOTAL KNEE ARTHROPLASTY Left 02/23/2014   Procedure: LEFT TOTAL KNEE REPLACEMENT;  Surgeon: Mauri Pole, MD;  [facility];  Service: Orthopedics;  Laterality: Left;  . TOTAL KNEE ARTHROPLASTY Right 05/25/2014   Procedure: RIGHT TOTAL KNEE ARTHROPLASTY;  Surgeon: Mauri Pole, MD;  [facility];  Service: Orthopedics;  Laterality: Right;    Home Medications:  Allergies as of 01/03/2021      Reactions   Gabapentin    Indomethacin    Other reaction(s): Edema of lower extremity   Lisinopril Cough   Nortriptyline    Other reaction(s): Edema of lower extremity   Empagliflozin    Other reaction(s): Urinary incontinence      Medication List       Accurate as of January 03, 2021 11:32 AM. If you have any questions, ask your nurse or doctor.        STOP taking these medications   donepezil 10 MG tablet Commonly known as: ARICEPT Stopped by: Hollice Espy, MD     TAKE these medications   amLODipine 10 MG tablet Commonly known as: NORVASC Take 1 tablet by mouth daily.   cyanocobalamin 1000 MCG tablet Take 1 tablet (1,000 mcg total) by mouth daily.   SV Vitamin B-12 ER 1000 MCG Tbcr Generic drug: Cyanocobalamin Take 1 tablet by mouth daily.   furosemide 20 MG tablet Commonly known as: LASIX Take 20 mg by mouth daily as needed.    hydrALAZINE 25 MG tablet Commonly known as: APRESOLINE Take 1 tablet (25 mg total) by mouth every 8 (eight) hours.   hydrALAZINE 50 MG tablet Commonly known as: APRESOLINE Take 50 mg by mouth 3 (three) times daily.   insulin glargine 100 UNIT/ML Solostar Pen Commonly known as: LANTUS Inject 28 Units into the skin daily.   losartan 25 MG tablet Commonly known as: COZAAR Take 3 tablets (75 mg total) by mouth daily.   tamsulosin 0.4 MG Caps capsule Commonly known as: FLOMAX Take 2 capsules (0.8 mg total) by mouth at bedtime.  Allergies:  Allergies  Allergen Reactions  . Gabapentin   . Indomethacin     Other reaction(s): Edema of lower extremity  . Lisinopril Cough  . Nortriptyline     Other reaction(s): Edema of lower extremity  . Empagliflozin     Other reaction(s): Urinary incontinence    Family History: Family History  Problem Relation Age of Onset  . Diabetes Mother   . Cancer Father   . Cancer Sister   . Other Sister        question of ALS    Social History:  reports that he has never smoked. He has never used smokeless tobacco. He reports that he does not drink alcohol and does not use drugs.   Physical Exam: BP (!) 121/53   Pulse (!) 43   Ht 5\' 8"  (1.727 m)   Wt 186 lb (84.4 kg)   BMI 28.28 kg/m   Constitutional:  Alert and oriented, No acute distress.  Accompanied by adult son today. HEENT: [place] AT, moist mucus membranes.  Trachea midline, no masses. Cardiovascular: No clubbing, cyanosis, or edema. Respiratory: Normal respiratory effort, no increased work of breathing. GU: Fairly significant benign appearing edema involving both penile foreskin primarily, mild edema along the shaft extending to the suprapubic area as well as the proximal scrotum. Extremity: Extensive pitting edema up to the level of the thighs, at least 2+. Skin: No rashes, bruises or suspicious lesions. Neurologic: Grossly intact, no focal deficits, moving all 4  extremities. Psychiatric: Normal mood and affect.  Laboratory Data: Lab Results  Component Value Date   WBC 7.2 11/14/2020   HGB 10.8 (L) 11/14/2020   HCT 33.0 (L) 11/14/2020   MCV 93.5 11/14/2020   PLT 286 11/14/2020    Lab Results  Component Value Date   CREATININE 1.25 12/06/2020   Lab Results  Component Value Date   HGBA1C 8.9 (H) 11/02/2020     Pertinent Imaging:  US RENAL  Narrative CLINICAL DATA:  Urinary retention.  EXAM: RENAL / URINARY TRACT ULTRASOUND COMPLETE  COMPARISON:  None.  FINDINGS: Right Kidney:  Renal measurements: 10.7 x 5.4 x 5.2 cm = volume: 154 mL. Echogenicity within normal limits. No mass or hydronephrosis visualized.  Left Kidney:  Renal measurements: 10.3 x 6.4 x 5.5 cm = volume: 191 mL. Echogenicity within normal limits. No mass or hydronephrosis visualized.  Bladder:  Appears normal for degree of bladder distention. No bladder distension.  Other:  None.  IMPRESSION: Normal examination.   Electronically Signed By: Claudie Revering M.D. On: 11/05/2020 16:41  Results for orders placed or performed in visit on 01/03/21  Microscopic Examination   Urine  Result Value Ref Range   WBC, UA 0-5 0 - 5 /hpf   RBC 0-2 0 - 2 /hpf   Epithelial Cells (non renal) 0-10 0 - 10 /hpf   Renal Epithel, UA 0-10 (A) None seen /hpf   Bacteria, UA Moderate (A) None seen/Few  Urinalysis, Complete  Result Value Ref Range   Specific Gravity, UA 1.010 1.005 - 1.030   pH, UA 5.0 5.0 - 7.5   Color, UA Yellow Yellow   Appearance Ur Cloudy (A) Clear   Leukocytes,UA Negative Negative   Protein,UA 1+ (A) Negative/Trace   Glucose, UA Negative Negative   Ketones, UA Negative Negative   RBC, UA Trace (A) Negative   Bilirubin, UA Negative Negative   Urobilinogen, Ur 0.2 0.2 - 1.0 mg/dL   Nitrite, UA Negative Negative   Microscopic Examination See below:  BLADDER SCAN AMB NON-IMAGING  Result Value Ref Range   Scan Result 37ml       Assessment & Plan:    1. Penile edema Significant lower extremity edema with significant weight gain along with new onset penoscrotal edema extending into suprapubic area  Based on overall history as well as clinical exam, this edema appears to be benign edema likely related to fluid overload rather than underlying GU pathology  I have asked him to his cardiologist as scheduled to work to titrate his diuretic- already scheduled  Also recommend supportive care with compressive undergarments to help reduce the amount of scrotal edema as well as propping up of his scrotum and penis when in seated or supine position for supportive care  - BLADDER SCAN AMB NON-IMAGING - Urinalysis, Complete - CULTURE, URINE COMPREHENSIVE  2. Urinary stream splitting On exam today, its fairly clear that his spraying of urinary stream and inability to control his stream is related to the degree of edema  Advised to return if fails to improve  We discussed the role of cystoscopy to rule out urethral stricture disease although unlikely especially given current clinical situation  3. Functional urinary incontinence Likely secondary to alteration in mental status along with medical comorbidities and diuretic use  Will hold off on treating right now until he gets the above resolved  Lastly today, the patient was emptying his bladder appropriately however in order to rule out UTI as underlying condition, of asked him to return later this afternoon after having lunch with a beverage for catheterized specimen to rule out infection-this was reviewed and is completely clear  We will have him follow-up as needed if symptoms fail to resolve  Hollice Espy, MD  [place] [address] [place], [place] [address] [phone]

## 2021-01-03 NOTE — Progress Notes (Signed)
In and Out Catheterization  Patient is present today for a I & O catheterization due to UA. Patient was cleaned and prepped in a sterile fashion with betadine . A 14FR cath was inserted no complications were noted , 264ml of urine return was noted, urine was yellow in color. A clean urine sample was collected for UA. Bladder was drained  And catheter was removed with out difficulty.    Performed by: Verlene Mayer, [place]

## 2021-01-05 ENCOUNTER — Other Ambulatory Visit: Payer: Self-pay

## 2021-01-05 ENCOUNTER — Inpatient Hospital Stay
Admission: EM | Admit: 2021-01-05 | Discharge: 2021-01-10 | DRG: 228 | Disposition: A | Discharge status: Home Health | Payer: Medicare Other | Attending: Family Medicine | Admitting: Family Medicine

## 2021-01-05 ENCOUNTER — Emergency Department: Payer: Medicare Other

## 2021-01-05 ENCOUNTER — Encounter: Payer: Self-pay | Admitting: Emergency Medicine

## 2021-01-05 DIAGNOSIS — Z79899 Other long term (current) drug therapy: Secondary | ICD-10-CM

## 2021-01-05 DIAGNOSIS — Z66 Do not resuscitate: Secondary | ICD-10-CM | POA: Diagnosis present

## 2021-01-05 DIAGNOSIS — H9190 Unspecified hearing loss, unspecified ear: Secondary | ICD-10-CM | POA: Diagnosis present

## 2021-01-05 DIAGNOSIS — Z9842 Cataract extraction status, left eye: Secondary | ICD-10-CM | POA: Diagnosis not present

## 2021-01-05 DIAGNOSIS — Z96653 Presence of artificial knee joint, bilateral: Secondary | ICD-10-CM | POA: Diagnosis present

## 2021-01-05 DIAGNOSIS — Z85820 Personal history of malignant melanoma of skin: Secondary | ICD-10-CM | POA: Diagnosis not present

## 2021-01-05 DIAGNOSIS — Z96612 Presence of left artificial shoulder joint: Secondary | ICD-10-CM | POA: Diagnosis present

## 2021-01-05 DIAGNOSIS — Z833 Family history of diabetes mellitus: Secondary | ICD-10-CM | POA: Diagnosis not present

## 2021-01-05 DIAGNOSIS — Z961 Presence of intraocular lens: Secondary | ICD-10-CM | POA: Diagnosis present

## 2021-01-05 DIAGNOSIS — N1831 Chronic kidney disease, stage 3a: Secondary | ICD-10-CM | POA: Diagnosis present

## 2021-01-05 DIAGNOSIS — N179 Acute kidney failure, unspecified: Secondary | ICD-10-CM | POA: Diagnosis present

## 2021-01-05 DIAGNOSIS — E1165 Type 2 diabetes mellitus with hyperglycemia: Secondary | ICD-10-CM | POA: Diagnosis not present

## 2021-01-05 DIAGNOSIS — E11649 Type 2 diabetes mellitus with hypoglycemia without coma: Secondary | ICD-10-CM | POA: Diagnosis not present

## 2021-01-05 DIAGNOSIS — Z20822 Contact with and (suspected) exposure to covid-19: Secondary | ICD-10-CM | POA: Diagnosis present

## 2021-01-05 DIAGNOSIS — M1990 Unspecified osteoarthritis, unspecified site: Secondary | ICD-10-CM | POA: Diagnosis present

## 2021-01-05 DIAGNOSIS — L03313 Cellulitis of chest wall: Secondary | ICD-10-CM | POA: Diagnosis present

## 2021-01-05 DIAGNOSIS — Z9841 Cataract extraction status, right eye: Secondary | ICD-10-CM | POA: Diagnosis not present

## 2021-01-05 DIAGNOSIS — I5033 Acute on chronic diastolic (congestive) heart failure: Secondary | ICD-10-CM | POA: Diagnosis present

## 2021-01-05 DIAGNOSIS — Z794 Long term (current) use of insulin: Secondary | ICD-10-CM | POA: Diagnosis not present

## 2021-01-05 DIAGNOSIS — I441 Atrioventricular block, second degree: Principal | ICD-10-CM | POA: Diagnosis present

## 2021-01-05 DIAGNOSIS — N400 Enlarged prostate without lower urinary tract symptoms: Secondary | ICD-10-CM | POA: Diagnosis present

## 2021-01-05 DIAGNOSIS — I130 Hypertensive heart and chronic kidney disease with heart failure and stage 1 through stage 4 chronic kidney disease, or unspecified chronic kidney disease: Secondary | ICD-10-CM | POA: Diagnosis present

## 2021-01-05 DIAGNOSIS — E1122 Type 2 diabetes mellitus with diabetic chronic kidney disease: Secondary | ICD-10-CM | POA: Diagnosis present

## 2021-01-05 DIAGNOSIS — R001 Bradycardia, unspecified: Secondary | ICD-10-CM | POA: Diagnosis present

## 2021-01-05 DIAGNOSIS — D631 Anemia in chronic kidney disease: Secondary | ICD-10-CM | POA: Diagnosis present

## 2021-01-05 DIAGNOSIS — E119 Type 2 diabetes mellitus without complications: Secondary | ICD-10-CM

## 2021-01-05 DIAGNOSIS — Z974 Presence of external hearing-aid: Secondary | ICD-10-CM | POA: Diagnosis not present

## 2021-01-05 DIAGNOSIS — Z888 Allergy status to other drugs, medicaments and biological substances status: Secondary | ICD-10-CM

## 2021-01-05 DIAGNOSIS — R0602 Shortness of breath: Secondary | ICD-10-CM | POA: Diagnosis present

## 2021-01-05 LAB — CBC
HCT: 36.3 % — ABNORMAL LOW (ref 39.0–52.0)
Hemoglobin: 11.6 g/dL — ABNORMAL LOW (ref 13.0–17.0)
MCH: 29.7 pg (ref 26.0–34.0)
MCHC: 32 g/dL (ref 30.0–36.0)
MCV: 92.8 fL (ref 80.0–100.0)
Platelets: 190 10*3/uL (ref 150–400)
RBC: 3.91 MIL/uL — ABNORMAL LOW (ref 4.22–5.81)
RDW: 13.9 % (ref 11.5–15.5)
WBC: 5.6 10*3/uL (ref 4.0–10.5)
nRBC: 0 % (ref 0.0–0.2)

## 2021-01-05 LAB — BASIC METABOLIC PANEL
Anion gap: 9 (ref 5–15)
BUN: 39 mg/dL — ABNORMAL HIGH (ref 8–23)
CO2: 22 mmol/L (ref 22–32)
Calcium: 8.8 mg/dL — ABNORMAL LOW (ref 8.9–10.3)
Chloride: 107 mmol/L (ref 98–111)
Creatinine, Ser: 1.47 mg/dL — ABNORMAL HIGH (ref 0.61–1.24)
GFR, Estimated: 45 mL/min — ABNORMAL LOW (ref >60)
Glucose, Bld: 198 mg/dL — ABNORMAL HIGH (ref 70–99)
Potassium: 4.1 mmol/L (ref 3.5–5.1)
Sodium: 138 mmol/L (ref 135–145)

## 2021-01-05 LAB — TROPONIN I (HIGH SENSITIVITY)
Troponin I (High Sensitivity): 51 ng/L — ABNORMAL HIGH (ref <18)
Troponin I (High Sensitivity): 48 ng/L — ABNORMAL HIGH (ref <18)

## 2021-01-05 LAB — RESP PANEL BY RT-PCR (FLU A&B, COVID) ARPGX2
Influenza A by PCR: NEGATIVE
Influenza B by PCR: NEGATIVE
SARS Coronavirus 2 by RT PCR: NEGATIVE

## 2021-01-05 LAB — BRAIN NATRIURETIC PEPTIDE: B Natriuretic Peptide: 355.1 pg/mL — ABNORMAL HIGH (ref 0.0–100.0)

## 2021-01-05 LAB — CBG MONITORING, ED: Glucose-Capillary: 95 mg/dL (ref 70–99)

## 2021-01-05 MED ORDER — ACETAMINOPHEN 650 MG RE SUPP: 650.0000 mg | Freq: Four times a day (QID) | RECTAL | Status: DC | PRN

## 2021-01-05 MED ORDER — INSULIN GLARGINE 100 UNIT/ML ~~LOC~~ SOLN
28.0000 [IU] | Freq: Every day | SUBCUTANEOUS | Status: DC
  Filled 2021-01-05: qty 0.28

## 2021-01-05 MED ORDER — CEPHALEXIN 500 MG PO CAPS
500.0000 mg | ORAL_CAPSULE | Freq: Once | ORAL | Status: AC
  Administered 2021-01-05: 500 mg via ORAL
  Filled 2021-01-05: qty 1

## 2021-01-05 MED ORDER — TAMSULOSIN HCL 0.4 MG PO CAPS
0.8000 mg | ORAL_CAPSULE | Freq: Every day | ORAL | Status: DC
  Administered 2021-01-05 – 2021-01-09 (×5): 0.8 mg via ORAL
  Filled 2021-01-05 (×5): qty 2

## 2021-01-05 MED ORDER — HEPARIN SODIUM (PORCINE) 5000 UNIT/ML IJ SOLN
5000.0000 [IU] | Freq: Two times a day (BID) | INTRAMUSCULAR | Status: DC
  Administered 2021-01-05 – 2021-01-10 (×10): 5000 [IU] via SUBCUTANEOUS
  Filled 2021-01-05 (×10): qty 1

## 2021-01-05 MED ORDER — INSULIN ASPART 100 UNIT/ML IJ SOLN
0.0000 [IU] | Freq: Three times a day (TID) | INTRAMUSCULAR | Status: DC
  Administered 2021-01-06 – 2021-01-07 (×2): 2 [IU] via SUBCUTANEOUS
  Administered 2021-01-07: 3 [IU] via SUBCUTANEOUS
  Filled 2021-01-05 (×3): qty 1

## 2021-01-05 MED ORDER — SODIUM CHLORIDE 0.9% FLUSH: 3.0000 mL | INTRAVENOUS | Status: DC | PRN

## 2021-01-05 MED ORDER — SODIUM CHLORIDE 0.9% FLUSH
3.0000 mL | Freq: Two times a day (BID) | INTRAVENOUS | Status: DC
  Administered 2021-01-05 – 2021-01-10 (×10): 3 mL via INTRAVENOUS

## 2021-01-05 MED ORDER — ACETAMINOPHEN 325 MG PO TABS
650.0000 mg | ORAL_TABLET | Freq: Four times a day (QID) | ORAL | Status: DC | PRN
  Filled 2021-01-05: qty 2

## 2021-01-05 MED ORDER — FUROSEMIDE 10 MG/ML IJ SOLN
60.0000 mg | Freq: Once | INTRAMUSCULAR | Status: AC
  Administered 2021-01-05: 60 mg via INTRAVENOUS
  Filled 2021-01-05: qty 8

## 2021-01-05 MED ORDER — SODIUM CHLORIDE 0.9 % IV SOLN: 250.0000 mL | INTRAVENOUS | Status: DC | PRN

## 2021-01-05 MED ORDER — HYDRALAZINE HCL 25 MG PO TABS
25.0000 mg | ORAL_TABLET | Freq: Three times a day (TID) | ORAL | Status: DC
  Administered 2021-01-05 – 2021-01-10 (×15): 25 mg via ORAL
  Filled 2021-01-05 (×14): qty 1

## 2021-01-05 NOTE — H&P (Addendum)
History and Physical    Caleb Dougherty:[id number] DOB: Oct 09, 1928 DOA: 01/05/2021  PCP: [name], MD    Patient coming from:  Home    Chief Complaint:  SOB/ abnormal ekg.    HPI: Caleb Dougherty is a 87 y.o. 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 Son is at bedside and reports that since his recent admission he has been having problems with heart and fluid. Patient lives with wife at home and across form sons home. Further reports gaining 10 pounds in the past 1 week, and his symptoms being significantly worse over the past 2-3 days with shortness of breath on exertion, lower extremity swelling and generalized weakness.No chest pain or abd pain or any other complaints.   Pt has past medical history of BL cataract sx and has vision problems, htn, BPH, DM ii, Impaired hearing.   ED Course:  Vitals:   01/05/21 1203 01/05/21 1205 01/05/21 1300 01/05/21 1335  BP:   115/82 (!) 184/65  Pulse:  (!) 46 (!) 44 (!) 28  Resp:  18 18 18   Temp:      TempSrc:      SpO2: 91% 98% 99% 97%  Weight:      Height:      In ed pt appears ill but in NAD, is alert and has hearing difficulty. Vitals stable except for bradycardia noted.son at bedside gives history. EKG in ed shows sinus bradycardia at 40 and prolong PR 276 and no ST changes.  CMP shows acute kidney injury with creatinine 1.47 and GFR 45. Lab Results  Component Value Date   CREATININE 1.47 (H) 01/05/2021   CREATININE 1.25 12/06/2020   CREATININE 1.21 11/14/2020  BNP is elevated at 355.1, CBC shows hemoglobin of 11.6 with normal platelets of 190, respiratory panel is negative.  In the emergency room patient received Lasix 60 mg IV x1. Cardiology has been consulted by [facility].  Review of Systems:  Review of Systems  Respiratory:  Positive for  shortness of breath.   Cardiovascular:  Positive for leg swelling.  Neurological:  Positive for weakness.  All other systems reviewed and are negative.   Past Medical History:  Diagnosis Date   Arthritis    osteoarthritis-knees and feet   BPH (benign prostatic hypertrophy)    Cancer (HCC)    melanoma(back)-no futher issues. Skin cancers face "frozen"   Cataract    02-12-14 left- ready for surgery, but only has had right done   Diabetes mellitus without complication ([place])    diabetes x4 yrs-oral med and Lantus used   HOH (hard of hearing)    wears bilateral hearing aids   Hypertension    Impaired hearing    hard of hearing -no hearing aids   Urgency of urination    Wears dentures    full lower   Wears glasses    reading    Past Surgical History:  Procedure Laterality Date   CARPAL TUNNEL RELEASE Right 06/23/2013   Procedure: RIGHT CARPAL TUNNEL RELEASE;  Surgeon: Cammie Sickle., MD;  Location: [place];  Service: Orthopedics;  Laterality: Right;   CATARACT EXTRACTION Right    CATARACT EXTRACTION W/PHACO Left 03/30/2019   Procedure: CATARACT EXTRACTION PHACO AND INTRAOCULAR LENS PLACEMENT (IOC) LEFT DIABETES  02:21.8  19.2%  28.18;  Surgeon: Eulogio Bear, MD;  Location: [place];  Service: Ophthalmology;  Laterality: Left;  diabetic - insulin   COLONOSCOPY     INCISION AND DRAINAGE WOUND WITH FOREIGN BODY REMOVAL Left 12/07/2014   Procedure: INCISION AND DRAINAGE WOUND WITH FOREIGN BODY REMOVAL;  Surgeon: Corky Mull, MD;  Location: [facility];  Service: Orthopedics;  Laterality: Left;   REVERSE SHOULDER ARTHROPLASTY Left 11/19/2014   Procedure: LEFT REVERSE SHOULDER ARTHROPLASTY;  Surgeon: Netta Cedars, MD;  Location: [place];  Service: Orthopedics;  Laterality: Left;   TOTAL KNEE ARTHROPLASTY Left 02/23/2014   Procedure: LEFT TOTAL KNEE REPLACEMENT;  Surgeon: Mauri Pole, MD;  [facility];  Service: Orthopedics;  Laterality: Left;    TOTAL KNEE ARTHROPLASTY Right 05/25/2014   Procedure: RIGHT TOTAL KNEE ARTHROPLASTY;  Surgeon: Mauri Pole, MD;  [facility];  Service: Orthopedics;  Laterality: Right;     reports that he has never smoked. He has never used smokeless tobacco. He reports that he does not drink alcohol and does not use drugs.  Allergies  Allergen Reactions   Gabapentin    Indomethacin     Other reaction(s): Edema of lower extremity   Lisinopril Cough   Nortriptyline     Other reaction(s): Edema of lower extremity   Empagliflozin     Other reaction(s): Urinary incontinence    Family History  Problem Relation Age of Onset   Cancer Father    Cancer Sister    Other Sister        question of ALS    Prior to Admission medications   Medication Sig Start Date End Date Taking? Authorizing Provider  amLODipine (NORVASC) 10 MG tablet Take 1 tablet by mouth daily. 12/22/20   [provider]  cyanocobalamin 1000 MCG tablet Take 1 tablet (1,000 mcg total) by mouth daily. 11/18/20   Love, Ivan Anchors, PA-C  furosemide (LASIX) 20 MG tablet Take 20 mg by mouth daily as needed. 12/14/20   [provider]  hydrALAZINE (APRESOLINE) 25 MG tablet Take 1 tablet (25 mg total) by mouth every 8 (eight) hours. 11/18/20   Love, Ivan Anchors, PA-C  hydrALAZINE (APRESOLINE) 50 MG tablet Take 50 mg by mouth 3 (three) times daily. 12/13/20   [provider]  insulin glargine (LANTUS) 100 UNIT/ML Solostar Pen Inject 28 Units into the skin daily. 11/18/20   Love, Ivan Anchors, PA-C  losartan (COZAAR) 25 MG tablet Take 3 tablets (75 mg total) by mouth daily. 11/19/20   Love, Ivan Anchors, PA-C  SV VITAMIN B-12 ER 1000 MCG TBCR Take 1 tablet by mouth daily. 11/18/20   [provider]  tamsulosin (FLOMAX) 0.4 MG CAPS capsule Take 2 capsules (0.8 mg total) by mouth at bedtime. 11/18/20   Bary Leriche, PA-C    Physical Exam: Vitals:   01/05/21 1203 01/05/21 1205 01/05/21 1300 01/05/21 1335  BP:   115/82 (!)  184/65  Pulse:  (!) 46 (!) 44 (!) 28  Resp:  18 18 18   Temp:      TempSrc:      SpO2: 91% 98% 99% 97%  Weight:      Height:       Physical Exam Vitals and nursing note reviewed.  Constitutional:      General: He is not in acute distress.    Appearance: He is ill-appearing. He is not diaphoretic.  HENT:     Head: Normocephalic and atraumatic.     Right Ear: External ear  normal.     Left Ear: External ear normal.     Mouth/Throat:     Mouth: Mucous membranes are moist.  Eyes:     Extraocular Movements: Extraocular movements intact.  Cardiovascular:     Rate and Rhythm: Regular rhythm. Bradycardia present.     Heart sounds: Normal heart sounds.  Pulmonary:     Effort: Pulmonary effort is normal.     Breath sounds: Normal breath sounds. No decreased breath sounds.  Abdominal:     General: Bowel sounds are normal.     Palpations: Abdomen is soft.  Musculoskeletal:     Right lower leg: Edema present.     Left lower leg: Edema present.  Skin:    General: Skin is warm.  Neurological:     General: No focal deficit present.     Mental Status: He is alert and oriented to person, place, and time.  Psychiatric:        Mood and Affect: Mood normal.        Behavior: Behavior normal.      Labs on Admission: I have personally reviewed following labs and imaging studies  No results for input(s): CKTOTAL, CKMB, TROPONINI in the last 72 hours. Lab Results  Component Value Date   WBC 5.6 01/05/2021   HGB 11.6 (L) 01/05/2021   HCT 36.3 (L) 01/05/2021   MCV 92.8 01/05/2021   PLT 190 01/05/2021    Recent Labs  Lab 01/05/21 1156  NA 138  K 4.1  CL 107  CO2 22  BUN 39*  CREATININE 1.47*  CALCIUM 8.8*  GLUCOSE 198*   Lab Results  Component Value Date   CHOL 125 11/02/2020   HDL 44 11/02/2020   LDLCALC 68 11/02/2020   TRIG 65 11/02/2020   No results found for: DDIMER Invalid input(s): POCBNP  Urinalysis    Component Value Date/Time   COLORURINE YELLOW 11/13/2020  0900   APPEARANCEUR Cloudy (A) 01/03/2021 1342   LABSPEC 1.010 11/13/2020 0900   PHURINE 6.0 11/13/2020 0900   GLUCOSEU Negative 01/03/2021 1342   HGBUR NEGATIVE 11/13/2020 0900   BILIRUBINUR Negative 01/03/2021 1342   KETONESUR NEGATIVE 11/13/2020 0900   PROTEINUR 1+ (A) 01/03/2021 1342   PROTEINUR 100 (A) 11/13/2020 0900   UROBILINOGEN 0.2 05/21/2014 0915   NITRITE Negative 01/03/2021 1342   NITRITE NEGATIVE 11/13/2020 0900   LEUKOCYTESUR Negative 01/03/2021 1342   LEUKOCYTESUR NEGATIVE 11/13/2020 0900    COVID-19 Labs No results for input(s): DDIMER, FERRITIN, LDH, CRP in the last 72 hours.  Lab Results  Component Value Date   SARSCOV2NAA NEGATIVE 01/05/2021   [place] NEGATIVE 11/01/2020   [place] NEGATIVE 03/26/2019    Radiological Exams on Admission: DG Chest 2 View  Result Date: 01/05/2021 CLINICAL DATA:  Shortness of breath. EXAM: CHEST - 2 VIEW COMPARISON:  CT chest dated November 03, 2020. Chest x-ray dated November 02, 2020. FINDINGS: Chronic cardiomegaly. Increasing interstitial thickening, small bilateral pleural effusions, and bibasilar atelectasis compared to prior studies. No pneumothorax. No acute osseous abnormality. Prior left shoulder arthroplasty. IMPRESSION: 1. Worsening congestive heart failure. Electronically Signed   By: Titus Dubin M.D.   On: 01/05/2021 13:05    EKG: Independently reviewed.  Sinus brady as in hpi.  Echo 10/2020: 1. Left ventricular ejection fraction, by estimation, is 60 to 65%. The left ventricle has normal function. The left ventricle has no regional wall motion abnormalities. Left ventricular diastolic parameters are consistent with Grade I diastolic dysfunction (impaired relaxation). 2. Right ventricular  systolic function is normal. The right ventricular size is normal. There is mildly elevated pulmonary artery systolic pressure. 3. Left atrial size was mildly dilated. 4. The mitral valve is normal in structure. No evidence of  mitral valve regurgitation. No evidence of mitral stenosis. 5. The aortic valve is normal in structure. Aortic valve regurgitation is not visualized. No aortic stenosis is present. 6. The inferior vena cava is dilated in size with <50% respiratory variability, suggesting right atrial pressure of 15 mmHg.   Assessment/Plan Principal Problem:   Bradycardia with less than 60 beats per minute Active Problems:   Heart block AV second degree   Type 2 diabetes mellitus (HCC)   SOB (shortness of breath)   AKI (acute kidney injury) ([place])  Bradycardia/ MB Type II 2nd degree heart block: Admit to progressive cardiac unit and cardiology consult.  Hold any beta blockers and av nodal blocking agents.  Tft's and electrolytes. Pacer pads if hr is persistently Below 40.   DM II: Home regimen of latus with SSI. A1c and hypoglycemic protocol.  SOB: Last ehco shows ef of 60-65 %. Pt has received lasix 60 mg iv. Strict I/O and monitor patient with supplemental oxygen.  AKI: Monitor renal function and avoid contrast studies.  Renally dose meds.     DVT prophylaxis:  Heparin   Code Status:  Full code   Family Communication:  Kahle, Mcqueen (Son)  [phone] [facility])   Disposition Plan:  Home   Consults called:  Cardiology Consult - Dr.kowalski   Admission status: Inpatient     Para Skeans MD [facility] [phone] How to contact the [facility] Attending or Consulting provider [place] or covering provider during after hours [place], for this patient.    Check the care team in [facility] and look for a) attending/consulting [facility] provider listed and b) the [facility] team listed Log into [email or web address] and use [place]'s universal password to access. If you do not have the password, please contact the hospital operator. Locate the [facility] provider you are looking for under [facility] and page to a number that you can be directly reached. If you still have difficulty reaching the  provider, please page the [facility] (Director on Call) for the Hospitalists listed on amion for assistance. [email or web address] Password [email or web address] 01/05/2021, 2:50 PM

## 2021-01-05 NOTE — ED Provider Notes (Signed)
[facility] Emergency Department Provider Note ____________________________________________   Event Date/Time   First MD Initiated Contact with Patient 01/05/21 1146     (approximate)  I have reviewed the triage vital signs and the nursing notes.  HISTORY  Chief Complaint Shortness of Breath and Edema   HPI Caleb Dougherty is a 87 y.o. malewho presents to the ED for evaluation of SOB, edema and abnormal outpatient EKG.   Chart review indicates patient saw Dr. Saralyn Pilar this morning as an outpatient and was directed to the ED due to abnormal EKG, concerns for Mobitz type II second-degree heart block and possible need for pacemaker placement.  Patient presents to the ED, accompanied by his son and who provides majority of history, for evaluation of worsening shortness of breath and edema.  Son reports that he lives across the street from the patient and helps out to daily with him.  Son reports that patient has been short of breath and gaining weight since an admission about a month ago, reports gaining about 30 pounds in weight since then.  Further reports gaining 10 pounds in the past 1 week, and his symptoms being significantly worse over the past 2-3 days with shortness of breath on exertion, lower extremity swelling and generalized weakness.  No recent falls, syncopal episodes, chest pain, emesis, abdominal pain.   For further reports separate concerns for "spots" on his chest that have come up in the past couple days that look like pimples and had a surrounding red spots.  Past Medical History:  Diagnosis Date   Arthritis    osteoarthritis-knees and feet   BPH (benign prostatic hypertrophy)    Cancer (HCC)    melanoma(back)-no futher issues. Skin cancers face "frozen"   Cataract    02-12-14 left- ready for surgery, but only has had right done   Diabetes mellitus without complication ([place])    diabetes x4 yrs-oral med and Lantus used   HOH (hard of  hearing)    wears bilateral hearing aids   Hypertension    Impaired hearing    hard of hearing -no hearing aids   Urgency of urination    Wears dentures    full lower   Wears glasses    reading    Patient Active Problem List   Diagnosis Date Noted   Vitamin B 12 deficiency 11/18/2020   Type 2 diabetes mellitus ([place]) 11/18/2020   Encephalopathy 11/08/2020   Hypertensive emergency 11/08/2020   Acute ischemic stroke (HCC)    Acute encephalopathy    Fever    HTN (hypertension)    Malnutrition of moderate degree 11/02/2020   Altered mental status 11/01/2020   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   S/P right TKA 05/25/2014   DJD (degenerative joint disease) of knee 05/25/2014   Overweight (BMI 25.0-29.9) 02/24/2014   S/P left TKA 02/23/2014    Past Surgical History:  Procedure Laterality Date   CARPAL TUNNEL RELEASE Right 06/23/2013   Procedure: RIGHT CARPAL TUNNEL RELEASE;  Surgeon: Cammie Sickle., MD;  Location: [place];  Service: Orthopedics;  Laterality: Right;   CATARACT EXTRACTION Right    CATARACT EXTRACTION W/PHACO Left 03/30/2019   Procedure: CATARACT EXTRACTION PHACO AND INTRAOCULAR LENS PLACEMENT (IOC) LEFT DIABETES  02:21.8  19.2%  28.18;  Surgeon: Eulogio Bear, MD;  Location: [place];  Service: Ophthalmology;  Laterality: Left;  diabetic - insulin   COLONOSCOPY     INCISION AND DRAINAGE WOUND WITH FOREIGN BODY  REMOVAL Left 12/07/2014   Procedure: INCISION AND DRAINAGE WOUND WITH FOREIGN BODY REMOVAL;  Surgeon: Corky Mull, MD;  Location: [facility];  Service: Orthopedics;  Laterality: Left;   REVERSE SHOULDER ARTHROPLASTY Left 11/19/2014   Procedure: LEFT REVERSE SHOULDER ARTHROPLASTY;  Surgeon: Netta Cedars, MD;  Location: [place];  Service: Orthopedics;  Laterality: Left;   TOTAL KNEE ARTHROPLASTY Left 02/23/2014   Procedure: LEFT TOTAL KNEE REPLACEMENT;  Surgeon: Mauri Pole, MD;  Location: [facility];  Service: Orthopedics;   Laterality: Left;   TOTAL KNEE ARTHROPLASTY Right 05/25/2014   Procedure: RIGHT TOTAL KNEE ARTHROPLASTY;  Surgeon: Mauri Pole, MD;  Location: [facility];  Service: Orthopedics;  Laterality: Right;    Prior to Admission medications   Medication Sig Start Date End Date Taking? Authorizing Provider  amLODipine (NORVASC) 10 MG tablet Take 1 tablet by mouth daily. 12/22/20   [provider]  cyanocobalamin 1000 MCG tablet Take 1 tablet (1,000 mcg total) by mouth daily. 11/18/20   Love, Ivan Anchors, PA-C  furosemide (LASIX) 20 MG tablet Take 20 mg by mouth daily as needed. 12/14/20   [provider]  hydrALAZINE (APRESOLINE) 25 MG tablet Take 1 tablet (25 mg total) by mouth every 8 (eight) hours. 11/18/20   Love, Ivan Anchors, PA-C  hydrALAZINE (APRESOLINE) 50 MG tablet Take 50 mg by mouth 3 (three) times daily. 12/13/20   [provider]  insulin glargine (LANTUS) 100 UNIT/ML Solostar Pen Inject 28 Units into the skin daily. 11/18/20   Love, Ivan Anchors, PA-C  losartan (COZAAR) 25 MG tablet Take 3 tablets (75 mg total) by mouth daily. 11/19/20   Love, Ivan Anchors, PA-C  SV VITAMIN B-12 ER 1000 MCG TBCR Take 1 tablet by mouth daily. 11/18/20   [provider]  tamsulosin (FLOMAX) 0.4 MG CAPS capsule Take 2 capsules (0.8 mg total) by mouth at bedtime. 11/18/20   Love, Ivan Anchors, PA-C    Allergies Gabapentin, Indomethacin, Lisinopril, Nortriptyline, and Empagliflozin  Family History  Problem Relation Age of Onset   Diabetes Mother    Cancer Father    Cancer Sister    Other Sister        question of ALS    Social History Social History   Tobacco Use   Smoking status: Never   Smokeless tobacco: Never  Vaping Use   Vaping Use: Never used  Substance Use Topics   Alcohol use: No   Drug use: No    Review of Systems  Constitutional: No fever/chills Eyes: No visual changes. ENT: No sore throat. Cardiovascular: Denies chest pain. Respiratory: Positive for shortness of  breath on exertion, orthopnea Gastrointestinal: No abdominal pain.  No nausea, no vomiting.  No diarrhea.  No constipation. Genitourinary: Negative for dysuria. Musculoskeletal: Negative for back pain.  Positive for atraumatic lower extremity swelling Skin: Negative for rash. Neurological: Negative for headaches, focal weakness or numbness.  ____________________________________________   PHYSICAL EXAM:  VITAL SIGNS: Vitals:   01/05/21 1205 01/05/21 1300  BP:  115/82  Pulse: (!) 46 (!) 44  Resp: 18 18  Temp:    SpO2: 98% 99%    Constitutional: Alert and oriented.  Mild conversational tachypnea.  No distress. Eyes: Conjunctivae are normal. PERRL. EOMI. Head: Atraumatic. Nose: No congestion/rhinnorhea. Mouth/Throat: Mucous membranes are moist.  Oropharynx non-erythematous. Neck: No stridor. No cervical spine tenderness to palpation. Cardiovascular: Bradycardic rate, regular rhythm. Grossly normal heart sounds.  Good peripheral circulation. Respiratory: Tachypneic to the low 20s no retractions.  Bibasilar crackles without wheezing Gastrointestinal: Soft ,  nontender to palpation. No CVA tenderness. Musculoskeletal: No lower extremity tenderness.  No joint effusions. No signs of acute trauma. Pitting edema to bilateral ankles symmetrically without overlying skin changes or signs of trauma Neurologic:  Normal speech and language. No gross focal neurologic deficits are appreciated.  Skin:  Skin is warm, dry and intact. 2 areas of erythematous rash with induration consistent with cellulitis to his chest.  1 to left lateral chest just beneath his nipple, about 1 x 3 cm in size.  The other being to his right upper chest around his clavicle, slightly larger at 2-5 cm in size.  Both with induration without fluctuance.  No purulence. Psychiatric: Mood and affect are normal. Speech and behavior are normal. ____________________________________________   LABS (all labs ordered are listed, but  only abnormal results are displayed)  Labs Reviewed  BASIC METABOLIC PANEL - Abnormal; Notable for the following components:      Result Value   Glucose, Bld 198 (*)    BUN 39 (*)    Creatinine, Ser 1.47 (*)    Calcium 8.8 (*)    GFR, Estimated 45 (*)    All other components within normal limits  CBC - Abnormal; Notable for the following components:   RBC 3.91 (*)    Hemoglobin 11.6 (*)    HCT 36.3 (*)    All other components within normal limits  BRAIN NATRIURETIC PEPTIDE - Abnormal; Notable for the following components:   B Natriuretic Peptide 355.1 (*)    All other components within normal limits  TROPONIN I (HIGH SENSITIVITY) - Abnormal; Notable for the following components:   Troponin I (High Sensitivity) 51 (*)    All other components within normal limits  RESP PANEL BY RT-PCR (FLU A&B, COVID) ARPGX2  TROPONIN I (HIGH SENSITIVITY)   ____________________________________________  12 Lead EKG  Sinus rhythm, rate of 40 bpm, normal axis.  Evidence of first and second-degree AV block's.  Prolonged PR interval as well as what appears to be Mobitz type II with 2-1 conduction.  No ischemic features.  Repeat EKG reviewed by me that shows an undetermined rhythm with a rate of 63 bpm.  Does not appear sinus as he does not does not have regular P waves and may be a junctional rhythm with a slight widening of his QRS.  No ischemic features. ____________________________________________  RADIOLOGY  ED MD interpretation: 2 view CXR reviewed by me with pulmonary vascular congestion and small bilateral pleural effusions without lobar filtration  Official radiology report(s): DG Chest 2 View  Result Date: 01/05/2021 CLINICAL DATA:  Shortness of breath. EXAM: CHEST - 2 VIEW COMPARISON:  CT chest dated November 03, 2020. Chest x-ray dated November 02, 2020. FINDINGS: Chronic cardiomegaly. Increasing interstitial thickening, small bilateral pleural effusions, and bibasilar atelectasis compared to  prior studies. No pneumothorax. No acute osseous abnormality. Prior left shoulder arthroplasty. IMPRESSION: 1. Worsening congestive heart failure. Electronically Signed   By: Titus Dubin M.D.   On: 01/05/2021 13:05    ____________________________________________   PROCEDURES and INTERVENTIONS  Procedure(s) performed (including Critical Care):  .1-3 Lead EKG Interpretation  Date/Time: 01/05/2021 1:22 PM Performed by: Vladimir Crofts, MD Authorized by: Vladimir Crofts, MD     Interpretation: abnormal     ECG rate:  42   ECG rate assessment: bradycardic     Rhythm: sinus bradycardia     Ectopy: none     Conduction: abnormal     Abnormal conduction:  2nd degree AV block (Mobitz 2)    Medications  cephALEXin (KEFLEX) capsule 500 mg (500 mg Oral Given 01/05/21 1308)  furosemide (LASIX) injection 60 mg (60 mg Intravenous Given 01/05/21 1310)    ____________________________________________   MDM / ED COURSE   87 year old male with history of diastolic CHF presents to the ED from cardiology office with evidence of new second-degree heart block and worsening CHF, requiring IV diuresis and medical admission for likely pacer placement.  Presents bradycardic with rate in the 40s, but hemodynamically stable.  Exam with evidence of volume overload and CHF with tachypnea but no distress.  No neurologic vascular deficits.  EKG reviewed by me with evidence of Mobitz type II secondary AV block without ischemic features.  Troponin is marginally elevated and possibly attributed to his respiratory status.  BNP is elevated and his clinical syndrome is most consistent with CHF.  Renal functioning is worsening, possibly due to a component of cardiorenal syndrome.  Further does have evidence of chest wall cellulitis, but no evidence of sepsis.  We will start Keflex to treat his cellulitis and initiate diuresis with IV Lasix here in the ED.  We will admit to hospitalist for further work-up and management.      ____________________________________________   FINAL CLINICAL IMPRESSION(S) / ED DIAGNOSES  Final diagnoses:  Mobitz type 2 second degree AV block  Shortness of breath  Acute on chronic diastolic congestive heart failure (HCC)  Cellulitis of chest wall     ED Discharge Orders     None        Malyn Aytes   Note:  This document was prepared using Set designer software and may include unintentional dictation errors.    Vladimir Crofts, MD 01/05/21 [phone]

## 2021-01-05 NOTE — ED Triage Notes (Signed)
Pt comes into the ED via [facility] clinic for increased [facility] with exertion, 2nd degree heart block, and to be directed admitted in order for pacemaker to be placed Monday.  Pt has known CHF.  Pt denies any chest pain at this time and currently has even and unlabored respirations.

## 2021-01-06 LAB — CBG MONITORING, ED
Glucose-Capillary: 65 mg/dL — ABNORMAL LOW (ref 70–99)
Glucose-Capillary: 167 mg/dL — ABNORMAL HIGH (ref 70–99)

## 2021-01-06 LAB — CULTURE, URINE COMPREHENSIVE

## 2021-01-06 LAB — COMPREHENSIVE METABOLIC PANEL
ALT: 28 U/L (ref 0–44)
AST: 29 U/L (ref 15–41)
Albumin: 3.4 g/dL — ABNORMAL LOW (ref 3.5–5.0)
Alkaline Phosphatase: 32 U/L — ABNORMAL LOW (ref 38–126)
Anion gap: 9 (ref 5–15)
BUN: 37 mg/dL — ABNORMAL HIGH (ref 8–23)
CO2: 25 mmol/L (ref 22–32)
Calcium: 8.9 mg/dL (ref 8.9–10.3)
Chloride: 108 mmol/L (ref 98–111)
Creatinine, Ser: 1.41 mg/dL — ABNORMAL HIGH (ref 0.61–1.24)
GFR, Estimated: 48 mL/min — ABNORMAL LOW (ref >60)
Glucose, Bld: 56 mg/dL — ABNORMAL LOW (ref 70–99)
Potassium: 3.5 mmol/L (ref 3.5–5.1)
Sodium: 142 mmol/L (ref 135–145)
Total Bilirubin: 1.2 mg/dL (ref 0.3–1.2)
Total Protein: 6.2 g/dL — ABNORMAL LOW (ref 6.5–8.1)

## 2021-01-06 LAB — CBC
HCT: 35.5 % — ABNORMAL LOW (ref 39.0–52.0)
Hemoglobin: 11.5 g/dL — ABNORMAL LOW (ref 13.0–17.0)
MCH: 29.6 pg (ref 26.0–34.0)
MCHC: 32.4 g/dL (ref 30.0–36.0)
MCV: 91.3 fL (ref 80.0–100.0)
Platelets: 184 10*3/uL (ref 150–400)
RBC: 3.89 MIL/uL — ABNORMAL LOW (ref 4.22–5.81)
RDW: 13.8 % (ref 11.5–15.5)
WBC: 4.5 10*3/uL (ref 4.0–10.5)
nRBC: 0 % (ref 0.0–0.2)

## 2021-01-06 LAB — GLUCOSE, CAPILLARY
Glucose-Capillary: 81 mg/dL (ref 70–99)
Glucose-Capillary: 143 mg/dL — ABNORMAL HIGH (ref 70–99)

## 2021-01-06 MED ORDER — AMLODIPINE BESYLATE 10 MG PO TABS
10.0000 mg | ORAL_TABLET | Freq: Every day | ORAL | Status: DC
  Administered 2021-01-06 – 2021-01-09 (×4): 10 mg via ORAL
  Filled 2021-01-06 (×4): qty 1

## 2021-01-06 MED ORDER — FUROSEMIDE 10 MG/ML IJ SOLN
40.0000 mg | Freq: Two times a day (BID) | INTRAMUSCULAR | Status: DC
  Administered 2021-01-06 – 2021-01-08 (×5): 40 mg via INTRAVENOUS
  Filled 2021-01-06 (×5): qty 4

## 2021-01-06 MED ORDER — INSULIN GLARGINE 100 UNIT/ML ~~LOC~~ SOLN
14.0000 [IU] | Freq: Every day | SUBCUTANEOUS | Status: DC
  Administered 2021-01-06 – 2021-01-07 (×2): 14 [IU] via SUBCUTANEOUS
  Filled 2021-01-06 (×2): qty 0.14

## 2021-01-06 MED ORDER — POTASSIUM CHLORIDE CRYS ER 20 MEQ PO TBCR
20.0000 meq | EXTENDED_RELEASE_TABLET | Freq: Two times a day (BID) | ORAL | Status: DC
  Administered 2021-01-06 – 2021-01-07 (×3): 20 meq via ORAL
  Filled 2021-01-06 (×3): qty 1

## 2021-01-06 NOTE — ED Notes (Signed)
Informed RN bed assigned 

## 2021-01-06 NOTE — Progress Notes (Signed)
Inpatient Diabetes Program Recommendations  AACE/ADA: New Consensus Statement on Inpatient Glycemic Control (2015)  Target Ranges:  Prepandial:   less than 140 mg/dL      Peak postprandial:   less than 180 mg/dL (1-2 hours)      Critically ill patients:  140 - 180 mg/dL   Lab Results  Component Value Date   GLUCAP 65 (L) 01/06/2021   HGBA1C 8.9 (H) 11/02/2020    Review of Glycemic Control Results for GIDEON, BURSTEIN (MRN [id number]) as of 01/06/2021 08:37  Ref. Range 01/05/2021 18:36 01/06/2021 08:35  Glucose-Capillary Latest Ref Range: 70 - 99 mg/dL 95 65 (L)   Diabetes history: DM2 Outpatient Diabetes medications: Lantus 28 units QD Current orders for Inpatient glycemic control: Lantus 28 units QD, Novolog 0-9 units TID  Inpatient Diabetes Program Recommendations:    Might consider decreasing Lantus to 50% of home dose 14 units QD as fasting CBG was 65 mg/dL this morning.    Will continue to follow while inpatient.  Thank you, Reche Dixon, RN, BSN Diabetes Coordinator Inpatient Diabetes Program [phone] (team pager from 8a-5p)

## 2021-01-06 NOTE — Plan of Care (Signed)
Telemetry contacted to inform patient has been sustaining in the upper 30s and low 40s. Patient was diaphoretic and SOB.  Since paced pads were already on patient from ED - Hooked up zoll to begin pacing.  Zoll set to pace at 60 - rhythm noted to be afib on the bedside monitor.  Patient verbalized they could feel it but it was tolerable.  By this time - it was shift change and 3rd shift had just arrived.  I noted no pain meds were on the [facility] at this time - 3rd shift Chrg assisted by starting chat w/ Day and Night providers - informing of situation, requesting a pain meds order and possible transfer to ICU.

## 2021-01-06 NOTE — Progress Notes (Signed)
[facility] PROGRESS NOTE    GARTH DIFFLEY  ZES:[id number] DOB: Nov 17, 1928 DOA: 01/05/2021 PCP: [name], MD      Brief Narrative:  Mr. Burley is a 87 y.o. M with hx DM, HTN, melanoma, and dCHF who presented with bradycardia.  Patient evidently developed some urinary symptoms recently, as a result of penile swelling.  When he went to urology for evaluation it was discovered that he had generalized lower extremity swelling in addition to penile swelling so he was sent to his cardiologist.  On arrival to the cardiologist he was found to be in second-degree heart block Mobitz type II and was referred to the ER for pacemaker placement and diuresis.       Assessment & Plan:  Acute on chronic diastolic CHF Presented with bilateral pleural effusions, cardiomegaly, interstitial opacities bilaterally, as well as shortness of breath on exertion and lower extremity edema. -Furosemide 40 mg IV twice a day  -K supplement -Strict I/Os, daily weights, telemetry  -Daily monitoring renal function    Second Deg AVB Mobitz type 2  Currently HR normal -Consult Cardiology, appreciate cares -Monitor on tele   Hypertension BP elevated -Continue amlodipine -Hold losartan, hydralazine  Diabetes Glucose low thi smorining -Continue SS corrections - Reduce Lantus  BPH  -Continue Flomax  CKD IIIa Baseline Cr 1.3, stable relative to baseline  Anemia of chronic kidney disease        Disposition: Status is: Inpatient  Remains inpatient appropriate because:Hemodynamically unstable  Dispo: The patient is from: Home              Anticipated d/c is to: Home              Patient currently is not medically stable to d/c.   Difficult to place patient No       Level of care: Progressive Cardiac       MDM: The below labs and imaging reports were reviewed and summarized above.  Medication management as above.    DVT prophylaxis: heparin  injection 5,000 Units Start: 01/05/21 1545 SCDs Start: 01/05/21 1531  Code Status: DNR Family Communication: Son [name] by phone           Subjective: No headache, chest pain, dyspnea, confusion.  Still swollen.  Objective: Vitals:   01/06/21 0500 01/06/21 0530 01/06/21 0600 01/06/21 1500  BP: (!) 148/59 138/71 (!) 146/64 128/70  Pulse:   71 63  Resp: 16 14 15 20   Temp:      TempSrc:      SpO2:   96% 99%  Weight:      Height:        Intake/Output Summary (Last 24 hours) at 01/06/2021 1525 Last data filed at 01/06/2021 2633 Gross per 24 hour  Intake --  Output 2650 ml  Net -2650 ml   Filed Weights   01/05/21 1139  Weight: 84 kg    Examination: General appearance: Elderly adult male, alert and in no obvious distress.,  Eating breakfast HEENT: Anicteric, conjunctiva pink, lids and lashes normal. No nasal deformity, discharge, epistaxis.  Lips moist.   Skin: Warm and dry.  No jaundice.  No suspicious rashes or lesions. Cardiac: RRR, nl S1-S2, no murmurs appreciated.  Capillary refill is brisk.   1+ LE edema.  Radial  pulses 2+ and symmetric. Respiratory: Normal respiratory rate and rhythm.  CTAB without rales or wheezes. Abdomen: Abdomen soft.  No TTP or guarding. No ascites, distension, hepatosplenomegaly.   MSK: No deformities  or effusions. Neuro: Awake and alert.  EOMI, moves all extremities. Speech fluent.    Psych: Sensorium intact and responding to questions, attention normal. Affect normal.  Judgment and insight appear moderately impaired.    Data Reviewed: I have personally reviewed following labs and imaging studies:  CBC: Recent Labs  Lab 01/05/21 1156 01/06/21 0437  WBC 5.6 4.5  HGB 11.6* 11.5*  HCT 36.3* 35.5*  MCV 92.8 91.3  PLT 190 322   Basic Metabolic Panel: Recent Labs  Lab 01/05/21 1156 01/06/21 0437  NA 138 142  K 4.1 3.5  CL 107 108  CO2 22 25  GLUCOSE 198* 56*  BUN 39* 37*  CREATININE 1.47* 1.41*  CALCIUM 8.8* 8.9    GFR: Estimated Creatinine Clearance: 37.5 mL/min (A) (by C-G formula based on SCr of 1.41 mg/dL (H)). Liver Function Tests: Recent Labs  Lab 01/06/21 0437  AST 29  ALT 28  ALKPHOS 32*  BILITOT 1.2  PROT 6.2*  ALBUMIN 3.4*   No results for input(s): LIPASE, AMYLASE in the last 168 hours. No results for input(s): AMMONIA in the last 168 hours. Coagulation Profile: No results for input(s): INR, PROTIME in the last 168 hours. Cardiac Enzymes: No results for input(s): CKTOTAL, CKMB, CKMBINDEX, TROPONINI in the last 168 hours. BNP (last 3 results) No results for input(s): PROBNP in the last 8760 hours. HbA1C: No results for input(s): HGBA1C in the last 72 hours. CBG: Recent Labs  Lab 01/05/21 1836 01/06/21 0835 01/06/21 1138  GLUCAP 95 65* 167*   Lipid Profile: No results for input(s): CHOL, HDL, LDLCALC, TRIG, CHOLHDL, LDLDIRECT in the last 72 hours. Thyroid Function Tests: No results for input(s): TSH, T4TOTAL, FREET4, T3FREE, THYROIDAB in the last 72 hours. Anemia Panel: No results for input(s): VITAMINB12, FOLATE, FERRITIN, TIBC, IRON, RETICCTPCT in the last 72 hours. Urine analysis:    Component Value Date/Time   COLORURINE YELLOW 11/13/2020 0900   APPEARANCEUR Cloudy (A) 01/03/2021 1342   LABSPEC 1.010 11/13/2020 0900   PHURINE 6.0 11/13/2020 0900   GLUCOSEU Negative 01/03/2021 1342   HGBUR NEGATIVE 11/13/2020 0900   BILIRUBINUR Negative 01/03/2021 1342   KETONESUR NEGATIVE 11/13/2020 0900   PROTEINUR 1+ (A) 01/03/2021 1342   PROTEINUR 100 (A) 11/13/2020 0900   UROBILINOGEN 0.2 05/21/2014 0915   NITRITE Negative 01/03/2021 1342   NITRITE NEGATIVE 11/13/2020 0900   LEUKOCYTESUR Negative 01/03/2021 1342   LEUKOCYTESUR NEGATIVE 11/13/2020 0900   Sepsis Labs: @LABRCNTIP (procalcitonin:4,lacticacidven:4)  ) Recent Results (from the past 240 hour(s))  Microscopic Examination     Status: Abnormal   Collection Time: 01/03/21  1:42 PM   Urine  Result Value  Ref Range Status   WBC, UA 0-5 0 - 5 /hpf Final   RBC 0-2 0 - 2 /hpf Final   Epithelial Cells (non renal) 0-10 0 - 10 /hpf Final   Renal Epithel, UA 0-10 (A) None seen /hpf Final   Bacteria, UA Moderate (A) None seen/Few Final  CULTURE, URINE COMPREHENSIVE     Status: None   Collection Time: 01/03/21  1:55 PM   Specimen: Urine   UR  Result Value Ref Range Status   Urine Culture, Comprehensive Final report  Final   Organism ID, Bacteria Comment  Final    Comment: No growth in 36 - 48 hours.  Resp Panel by RT-PCR (Flu A&B, Covid) Nasopharyngeal Swab     Status: None   Collection Time: 01/05/21 11:56 AM   Specimen: Nasopharyngeal Swab; Nasopharyngeal(NP) swabs in vial transport  medium  Result Value Ref Range Status   SARS Coronavirus 2 by RT PCR NEGATIVE NEGATIVE Final    Comment: (NOTE) SARS-CoV-2 target nucleic acids are NOT DETECTED.  The SARS-CoV-2 RNA is generally detectable in upper respiratory specimens during the acute phase of infection. The lowest concentration of SARS-CoV-2 viral copies this assay can detect is 138 copies/mL. A negative result does not preclude SARS-Cov-2 infection and should not be used as the sole basis for treatment or other patient management decisions. A negative result may occur with  improper specimen collection/handling, submission of specimen other than nasopharyngeal swab, presence of viral mutation(s) within the areas targeted by this assay, and inadequate number of viral copies(<138 copies/mL). A negative result must be combined with clinical observations, patient history, and epidemiological information. The expected result is Negative.  Fact Sheet for Patients:  EntrepreneurPulse.com.au  Fact Sheet for Healthcare Providers:  IncredibleEmployment.be  This test is no t yet approved or cleared by the [place] FDA and  has been authorized for detection and/or diagnosis of SARS-CoV-2 by FDA under an  Emergency Use Authorization (EUA). This EUA will remain  in effect (meaning this test can be used) for the duration of the COVID-19 declaration under Section 564(b)(1) of the Act, 21 U.S.C.section 360bbb-3(b)(1), unless the authorization is terminated  or revoked sooner.       Influenza A by PCR NEGATIVE NEGATIVE Final   Influenza B by PCR NEGATIVE NEGATIVE Final    Comment: (NOTE) The Xpert Xpress SARS-CoV-2/FLU/RSV plus assay is intended as an aid in the diagnosis of influenza from Nasopharyngeal swab specimens and should not be used as a sole basis for treatment. Nasal washings and aspirates are unacceptable for Xpert Xpress SARS-CoV-2/FLU/RSV testing.  Fact Sheet for Patients: EntrepreneurPulse.com.au  Fact Sheet for Healthcare Providers: IncredibleEmployment.be  This test is not yet approved or cleared by the [place] FDA and has been authorized for detection and/or diagnosis of SARS-CoV-2 by FDA under an Emergency Use Authorization (EUA). This EUA will remain in effect (meaning this test can be used) for the duration of the COVID-19 declaration under Section 564(b)(1) of the Act, 21 U.S.C. section 360bbb-3(b)(1), unless the authorization is terminated or revoked.  Performed at [facility], [address], [place], [place] [address]          Radiology Studies: DG Chest 2 View  Result Date: 01/05/2021 CLINICAL DATA:  Shortness of breath. EXAM: CHEST - 2 VIEW COMPARISON:  CT chest dated November 03, 2020. Chest x-ray dated November 02, 2020. FINDINGS: Chronic cardiomegaly. Increasing interstitial thickening, small bilateral pleural effusions, and bibasilar atelectasis compared to prior studies. No pneumothorax. No acute osseous abnormality. Prior left shoulder arthroplasty. IMPRESSION: 1. Worsening congestive heart failure. Electronically Signed   By: Titus Dubin M.D.   On: 01/05/2021 13:05        Scheduled Meds:   furosemide  40 mg Intravenous BID   heparin injection (subcutaneous)  5,000 Units Subcutaneous Q12H   hydrALAZINE  25 mg Oral Q8H   insulin aspart  0-9 Units Subcutaneous TID WC   insulin glargine  14 Units Subcutaneous Daily   sodium chloride flush  3 mL Intravenous Q12H   tamsulosin  0.8 mg Oral QHS   Continuous Infusions:  sodium chloride       LOS: 1 day    Time spent: 35 minutes    Edwin Dada, MD [facility] 01/06/2021, 3:25 PM     Please page though [facility] or Epic secure chat:  For [employer], Adult nurse

## 2021-01-06 NOTE — Progress Notes (Addendum)
Cross coverage PROGRESS NOTE    Caleb Dougherty  RJJ:[id number] DOB: [birth year] DOA: 01/05/2021 PCP: Caleb Pink, MD   Called by nurses due to concern for patient who was placed on Zoll about 20 minutes prior due to heart rate in the 30s, symptomatic for shortness of breath and diaphoresis.  Specifically requesting transfer to stepdown and additional orders such as pain medication to tolerate pacing.   Brief Narrative: 87 year old male sent in by Dr. Saralyn Dougherty who was found to be in Mobitz type II second-degree heart block during a routine follow-up appointment.  Patient was asymptomatic.  Pacemaker is planned for 6/13.  Cross coverage event: Telemetry called at around 6:45 PM to state that patient heart rate was in the low 30s.  Nurses report that patient was diaphoretic and short of breath, however patient was maintaining a good blood pressure throughout.  He was placed on the [place] set at 27.  On my arrival, patient was sitting up in bed and appeared comfortable and was looking at TV.  Assessment & Plan:    Heart block AV second degree -Heart rate while on ZOLL fluctuating mostly from the low 30s to the 50s.  Dips to the low 30s with transient lasting a few seconds.  Patient was not symptomatic during these times.  Remaining comfortable throughout - While at bedside, I spoke with Dr. Nehemiah Dougherty Recommendations as follows: 1.Given that patient appears comfortable and maintaining blood pressures, no need for urgent transfer to stepdown unit, unless condition deteriorates during the night 2.  No need for sedative or pain medication 3.  Turn Zoll to 30  Recommendations communicated to PCU nurses as well as to stepdown charge.  Patient will remain on PCU at this time  CRITICAL CARE Performed by: Athena Masse   Total critical care time: 60 minutes  Critical care time was exclusive of separately billable procedures and treating other patients.  Critical care was necessary to treat  or prevent imminent or life-threatening deterioration.  Critical care was time spent personally by me on the following activities: development of treatment plan with patient and/or surrogate as well as nursing, discussions with consultants, evaluation of patient's response to treatment, examination of patient, obtaining history from patient or surrogate, ordering and performing treatments and interventions, ordering and review of laboratory studies, ordering and review of radiographic studies, pulse oximetry and re-evaluation of patient's condition. 60  Subjective: Patient denying any complaints.  Denies chest pain, shortness of breath, palpitations, headache or lightheadedness   Objective: Vitals:   01/06/21 0530 01/06/21 0600 01/06/21 1500 01/06/21 1600  BP: 138/71 (!) 146/64 128/70 (!) 144/78  Pulse:  71 63 74  Resp: 14 15 20 16   Temp:    98.1 F (36.7 C)  TempSrc:    Oral  SpO2:  96% 99% 100%  Weight:      Height:        Intake/Output Summary (Last 24 hours) at 01/06/2021 1949 Last data filed at 01/06/2021 1840 Gross per 24 hour  Intake 240 ml  Output 1900 ml  Net -1660 ml   Filed Weights   01/05/21 1139  Weight: 84 kg    Examination:  General exam: Appears calm and comfortable  Respiratory system: Clear to auscultation. Respiratory effort normal. Cardiovascular system: S1 & S2 heard, RRR. No JVD, murmurs, rubs, gallops or clicks. No pedal edema. Gastrointestinal system: Abdomen is nondistended, soft and nontender. No organomegaly or masses felt. Normal bowel sounds heard. Central nervous system: Alert and  oriented. No focal neurological deficits. Extremities: Symmetric 5 x 5 power. Skin: No rashes, lesions or ulcers Psychiatry: Judgement and insight appear normal. Mood & affect appropriate.     Data Reviewed: I have personally reviewed following labs and imaging studies  CBC: Recent Labs  Lab 01/05/21 1156 01/06/21 0437  WBC 5.6 4.5  HGB 11.6* 11.5*  HCT  36.3* 35.5*  MCV 92.8 91.3  PLT 190 301   Basic Metabolic Panel: Recent Labs  Lab 01/05/21 1156 01/06/21 0437  NA 138 142  K 4.1 3.5  CL 107 108  CO2 22 25  GLUCOSE 198* 56*  BUN 39* 37*  CREATININE 1.47* 1.41*  CALCIUM 8.8* 8.9   GFR: Estimated Creatinine Clearance: 37.5 mL/min (A) (by C-G formula based on SCr of 1.41 mg/dL (H)). Liver Function Tests: Recent Labs  Lab 01/06/21 0437  AST 29  ALT 28  ALKPHOS 32*  BILITOT 1.2  PROT 6.2*  ALBUMIN 3.4*   No results for input(s): LIPASE, AMYLASE in the last 168 hours. No results for input(s): AMMONIA in the last 168 hours. Coagulation Profile: No results for input(s): INR, PROTIME in the last 168 hours. Cardiac Enzymes: No results for input(s): CKTOTAL, CKMB, CKMBINDEX, TROPONINI in the last 168 hours. BNP (last 3 results) No results for input(s): PROBNP in the last 8760 hours. HbA1C: No results for input(s): HGBA1C in the last 72 hours. CBG: Recent Labs  Lab 01/05/21 1836 01/06/21 0835 01/06/21 1138 01/06/21 1611  GLUCAP 95 65* 167* 81   Lipid Profile: No results for input(s): CHOL, HDL, LDLCALC, TRIG, CHOLHDL, LDLDIRECT in the last 72 hours. Thyroid Function Tests: No results for input(s): TSH, T4TOTAL, FREET4, T3FREE, THYROIDAB in the last 72 hours. Anemia Panel: No results for input(s): VITAMINB12, FOLATE, FERRITIN, TIBC, IRON, RETICCTPCT in the last 72 hours. Sepsis Labs: No results for input(s): PROCALCITON, LATICACIDVEN in the last 168 hours.  Recent Results (from the past 240 hour(s))  Microscopic Examination     Status: Abnormal   Collection Time: 01/03/21  1:42 PM   Urine  Result Value Ref Range Status   WBC, UA 0-5 0 - 5 /hpf Final   RBC 0-2 0 - 2 /hpf Final   Epithelial Cells (non renal) 0-10 0 - 10 /hpf Final   Renal Epithel, UA 0-10 (A) None seen /hpf Final   Bacteria, UA Moderate (A) None seen/Few Final  CULTURE, URINE COMPREHENSIVE     Status: None   Collection Time: 01/03/21  1:55 PM    Specimen: Urine   UR  Result Value Ref Range Status   Urine Culture, Comprehensive Final report  Final   Organism ID, Bacteria Comment  Final    Comment: No growth in 36 - 48 hours.  Resp Panel by RT-PCR (Flu A&B, Covid) Nasopharyngeal Swab     Status: None   Collection Time: 01/05/21 11:56 AM   Specimen: Nasopharyngeal Swab; Nasopharyngeal(NP) swabs in vial transport medium  Result Value Ref Range Status   SARS Coronavirus 2 by RT PCR NEGATIVE NEGATIVE Final    Comment: (NOTE) SARS-CoV-2 target nucleic acids are NOT DETECTED.  The SARS-CoV-2 RNA is generally detectable in upper respiratory specimens during the acute phase of infection. The lowest concentration of SARS-CoV-2 viral copies this assay can detect is 138 copies/mL. A negative result does not preclude SARS-Cov-2 infection and should not be used as the sole basis for treatment or other patient management decisions. A negative result may occur with  improper specimen collection/handling, submission of  specimen other than nasopharyngeal swab, presence of viral mutation(s) within the areas targeted by this assay, and inadequate number of viral copies(<138 copies/mL). A negative result must be combined with clinical observations, patient history, and epidemiological information. The expected result is Negative.  Fact Sheet for Patients:  EntrepreneurPulse.com.au  Fact Sheet for Healthcare Providers:  IncredibleEmployment.be  This test is no t yet approved or cleared by the [place] FDA and  has been authorized for detection and/or diagnosis of SARS-CoV-2 by FDA under an Emergency Use Authorization (EUA). This EUA will remain  in effect (meaning this test can be used) for the duration of the COVID-19 declaration under Section 564(b)(1) of the Act, 21 U.S.C.section 360bbb-3(b)(1), unless the authorization is terminated  or revoked sooner.       Influenza A by PCR NEGATIVE  NEGATIVE Final   Influenza B by PCR NEGATIVE NEGATIVE Final    Comment: (NOTE) The Xpert Xpress SARS-CoV-2/FLU/RSV plus assay is intended as an aid in the diagnosis of influenza from Nasopharyngeal swab specimens and should not be used as a sole basis for treatment. Nasal washings and aspirates are unacceptable for Xpert Xpress SARS-CoV-2/FLU/RSV testing.  Fact Sheet for Patients: EntrepreneurPulse.com.au  Fact Sheet for Healthcare Providers: IncredibleEmployment.be  This test is not yet approved or cleared by the [place] FDA and has been authorized for detection and/or diagnosis of SARS-CoV-2 by FDA under an Emergency Use Authorization (EUA). This EUA will remain in effect (meaning this test can be used) for the duration of the COVID-19 declaration under Section 564(b)(1) of the Act, 21 U.S.C. section 360bbb-3(b)(1), unless the authorization is terminated or revoked.  Performed at [facility], [address] [place], [place] [address]          Radiology Studies: DG Chest 2 View  Result Date: 01/05/2021 CLINICAL DATA:  Shortness of breath. EXAM: CHEST - 2 VIEW COMPARISON:  CT chest dated November 03, 2020. Chest x-ray dated November 02, 2020. FINDINGS: Chronic cardiomegaly. Increasing interstitial thickening, small bilateral pleural effusions, and bibasilar atelectasis compared to prior studies. No pneumothorax. No acute osseous abnormality. Prior left shoulder arthroplasty. IMPRESSION: 1. Worsening congestive heart failure. Electronically Signed   By: Titus Dubin M.D.   On: 01/05/2021 13:05        Scheduled Meds:  amLODipine  10 mg Oral Q1500   furosemide  40 mg Intravenous BID   heparin injection (subcutaneous)  5,000 Units Subcutaneous Q12H   hydrALAZINE  25 mg Oral Q8H   insulin aspart  0-9 Units Subcutaneous TID WC   insulin glargine  14 Units Subcutaneous Daily   potassium chloride  20 mEq Oral BID   sodium  chloride flush  3 mL Intravenous Q12H   tamsulosin  0.8 mg Oral QHS   Continuous Infusions:  sodium chloride       LOS: 1 day    Time spent: 60 minutes    Athena Masse, MD [facility]

## 2021-01-06 NOTE — Plan of Care (Signed)
Report given to Levada Schilling, RN. Patient transferring to room 236 (floor 2A), once room is cleaned.

## 2021-01-07 DIAGNOSIS — I5033 Acute on chronic diastolic (congestive) heart failure: Secondary | ICD-10-CM

## 2021-01-07 LAB — CBC
HCT: 35.2 % — ABNORMAL LOW (ref 39.0–52.0)
Hemoglobin: 11.3 g/dL — ABNORMAL LOW (ref 13.0–17.0)
MCH: 29.7 pg (ref 26.0–34.0)
MCHC: 32.1 g/dL (ref 30.0–36.0)
MCV: 92.6 fL (ref 80.0–100.0)
Platelets: 192 10*3/uL (ref 150–400)
RBC: 3.8 MIL/uL — ABNORMAL LOW (ref 4.22–5.81)
RDW: 13.7 % (ref 11.5–15.5)
WBC: 5 10*3/uL (ref 4.0–10.5)
nRBC: 0 % (ref 0.0–0.2)

## 2021-01-07 LAB — BASIC METABOLIC PANEL
Anion gap: 8 (ref 5–15)
BUN: 35 mg/dL — ABNORMAL HIGH (ref 8–23)
CO2: 26 mmol/L (ref 22–32)
Calcium: 8.7 mg/dL — ABNORMAL LOW (ref 8.9–10.3)
Chloride: 103 mmol/L (ref 98–111)
Creatinine, Ser: 1.24 mg/dL (ref 0.61–1.24)
GFR, Estimated: 56 mL/min — ABNORMAL LOW (ref >60)
Glucose, Bld: 174 mg/dL — ABNORMAL HIGH (ref 70–99)
Potassium: 4 mmol/L (ref 3.5–5.1)
Sodium: 137 mmol/L (ref 135–145)

## 2021-01-07 LAB — GLUCOSE, CAPILLARY
Glucose-Capillary: 153 mg/dL — ABNORMAL HIGH (ref 70–99)
Glucose-Capillary: 241 mg/dL — ABNORMAL HIGH (ref 70–99)
Glucose-Capillary: 104 mg/dL — ABNORMAL HIGH (ref 70–99)
Glucose-Capillary: 163 mg/dL — ABNORMAL HIGH (ref 70–99)

## 2021-01-07 MED ORDER — INSULIN ASPART 100 UNIT/ML IJ SOLN
0.0000 [IU] | Freq: Three times a day (TID) | INTRAMUSCULAR | Status: DC
  Administered 2021-01-08: 11 [IU] via SUBCUTANEOUS
  Administered 2021-01-08: 2 [IU] via SUBCUTANEOUS
  Administered 2021-01-09: 3 [IU] via SUBCUTANEOUS
  Filled 2021-01-07 (×4): qty 1

## 2021-01-07 MED ORDER — INSULIN GLARGINE 100 UNIT/ML ~~LOC~~ SOLN
21.0000 [IU] | Freq: Every day | SUBCUTANEOUS | Status: DC
  Administered 2021-01-08 – 2021-01-10 (×3): 21 [IU] via SUBCUTANEOUS
  Filled 2021-01-07 (×3): qty 0.21

## 2021-01-07 MED ORDER — INSULIN ASPART 100 UNIT/ML IJ SOLN
0.0000 [IU] | Freq: Every day | INTRAMUSCULAR | Status: DC
  Administered 2021-01-08: 2 [IU] via SUBCUTANEOUS
  Administered 2021-01-09: 4 [IU] via SUBCUTANEOUS
  Filled 2021-01-07: qty 1

## 2021-01-07 NOTE — Progress Notes (Signed)
[facility] PROGRESS NOTE    Caleb Dougherty  OIZ:[id number] DOB: 05/27/1929 DOA: 01/05/2021 PCP: [name], MD      Brief Narrative:  Caleb Dougherty is a 87 y.o. M with hx DM, HTN, melanoma, and dCHF who presented with bradycardia.  Patient evidently developed some urinary symptoms recently, as a result of penile swelling.  When he went to urology for evaluation it was discovered that he had generalized lower extremity swelling in addition to penile swelling so he was sent to his cardiologist.  On arrival to the cardiologist he was found to be in second-degree heart block Mobitz type II and was referred to the ER for pacemaker placement and diuresis.       Assessment & Plan:  Acute on chronic diastolic CHF Net -1 L yesterday, 3.2 L on admission, still edematous, potassium and creatinine stable - Continue IV furosemide 40 twice daily - Continue potassium supplement -Strict I/Os, daily weights, telemetry  -Daily monitoring renal function    Second-degree AV block, Mobitz type II Bradycardic overnight with symptoms, externally paced, resolved spontaneously, heart rate normal now -Consult Cardiology, appreciate cares -Monitor on tele, continue external pacer pads in place - Plan for pacer on Monday   Hypertension BP mildly elevated - Continue amlodipine, hydralazine - Hold losartan   Type 2 diabetes, well controlled, with chronic kidney disease Hypoglycemic yesterday but hyperglycemic today - Continue Lantus, increase closer to home dose - Continue sliding scale correction  PPI - Continue Flomax  CKD IIIa Baseline Cr 1.3, creatinine improved today with diuresis  Anemia of chronic kidney disease        Disposition: Status is: Inpatient  Remains inpatient appropriate because:Hemodynamically unstable and still requiring IV Lasix  Dispo: The patient is from: Home              Anticipated d/c is to: Home              Patient currently  is not medically stable to d/c.   Difficult to place patient No       Level of care: Progressive Cardiac       MDM: The below labs and imaging reports were reviewed and summarized above.  Medication management as above.    DVT prophylaxis: heparin injection 5,000 Units Start: 01/05/21 1545 SCDs Start: 01/05/21 1531  Code Status: DNR Family Communication: Left voicemail for son Caleb Dougherty by phone           Subjective: No specific complaints, no palpitations, chest pain, dyspnea, confusion.  Swelling is still present.  Objective: Vitals:   01/07/21 0635 01/07/21 0700 01/07/21 1200 01/07/21 1215  BP: (!) 157/74  (!) 150/51 (!) 150/81  Pulse:   (!) 35 (!) 55  Resp:   19 19  Temp:  98.1 F (36.7 C)  98.2 F (36.8 C)  TempSrc:  Oral  Oral  SpO2:   94% 100%  Weight:      Height:        Intake/Output Summary (Last 24 hours) at 01/07/2021 1419 Last data filed at 01/07/2021 1200 Gross per 24 hour  Intake 240 ml  Output 2500 ml  Net -2260 ml   Filed Weights   01/05/21 1139 01/07/21 0409  Weight: 84 kg 80.6 kg    Examination: General appearance: Elderly adult male, lying in bed, no acute distress, eating breakfast     HEENT:    Skin:  Cardiac: Slow, regular, no systolic murmurs, 3-3+ lower extremity edema bilaterally Respiratory: Normal  respiratory rate and rhythm, lungs clear without rales or wheezes Abdomen: Abdomen soft no tenderness palpation or guarding, no ascites or distention MSK: No deformities or effusions, normal muscle bulk and tone for age Neuro: Awake and alert, extraocular movements intact, moves all extremities with generalized weakness, symmetric strength, speech fluent, face symmetric Psych: Sensorium intact and responding to questions, memory appears slightly impaired, affect pleasant, attentive to our conversation.     Data Reviewed: I have personally reviewed following labs and imaging studies:  CBC: Recent Labs  Lab 01/05/21 1156  01/06/21 0437 01/07/21 0447  WBC 5.6 4.5 5.0  HGB 11.6* 11.5* 11.3*  HCT 36.3* 35.5* 35.2*  MCV 92.8 91.3 92.6  PLT 190 184 546   Basic Metabolic Panel: Recent Labs  Lab 01/05/21 1156 01/06/21 0437 01/07/21 0447  NA 138 142 137  K 4.1 3.5 4.0  CL 107 108 103  CO2 22 25 26   GLUCOSE 198* 56* 174*  BUN 39* 37* 35*  CREATININE 1.47* 1.41* 1.24  CALCIUM 8.8* 8.9 8.7*   GFR: Estimated Creatinine Clearance: 39.1 mL/min (by C-G formula based on SCr of 1.24 mg/dL). Liver Function Tests: Recent Labs  Lab 01/06/21 0437  AST 29  ALT 28  ALKPHOS 32*  BILITOT 1.2  PROT 6.2*  ALBUMIN 3.4*   No results for input(s): LIPASE, AMYLASE in the last 168 hours. No results for input(s): AMMONIA in the last 168 hours. Coagulation Profile: No results for input(s): INR, PROTIME in the last 168 hours. Cardiac Enzymes: No results for input(s): CKTOTAL, CKMB, CKMBINDEX, TROPONINI in the last 168 hours. BNP (last 3 results) No results for input(s): PROBNP in the last 8760 hours. HbA1C: No results for input(s): HGBA1C in the last 72 hours. CBG: Recent Labs  Lab 01/06/21 1138 01/06/21 1611 01/06/21 2107 01/07/21 0751 01/07/21 1154  GLUCAP 167* 81 143* 153* 241*   Lipid Profile: No results for input(s): CHOL, HDL, LDLCALC, TRIG, CHOLHDL, LDLDIRECT in the last 72 hours. Thyroid Function Tests: No results for input(s): TSH, T4TOTAL, FREET4, T3FREE, THYROIDAB in the last 72 hours. Anemia Panel: No results for input(s): VITAMINB12, FOLATE, FERRITIN, TIBC, IRON, RETICCTPCT in the last 72 hours. Urine analysis:    Component Value Date/Time   COLORURINE YELLOW 11/13/2020 0900   APPEARANCEUR Cloudy (A) 01/03/2021 1342   LABSPEC 1.010 11/13/2020 0900   PHURINE 6.0 11/13/2020 0900   GLUCOSEU Negative 01/03/2021 1342   HGBUR NEGATIVE 11/13/2020 0900   BILIRUBINUR Negative 01/03/2021 1342   KETONESUR NEGATIVE 11/13/2020 0900   PROTEINUR 1+ (A) 01/03/2021 1342   PROTEINUR 100 (A)  11/13/2020 0900   UROBILINOGEN 0.2 05/21/2014 0915   NITRITE Negative 01/03/2021 1342   NITRITE NEGATIVE 11/13/2020 0900   LEUKOCYTESUR Negative 01/03/2021 1342   LEUKOCYTESUR NEGATIVE 11/13/2020 0900   Sepsis Labs: @LABRCNTIP (procalcitonin:4,lacticacidven:4)  ) Recent Results (from the past 240 hour(s))  Microscopic Examination     Status: Abnormal   Collection Time: 01/03/21  1:42 PM   Urine  Result Value Ref Range Status   WBC, UA 0-5 0 - 5 /hpf Final   RBC 0-2 0 - 2 /hpf Final   Epithelial Cells (non renal) 0-10 0 - 10 /hpf Final   Renal Epithel, UA 0-10 (A) None seen /hpf Final   Bacteria, UA Moderate (A) None seen/Few Final  CULTURE, URINE COMPREHENSIVE     Status: None   Collection Time: 01/03/21  1:55 PM   Specimen: Urine   UR  Result Value Ref Range Status   Urine  Culture, Comprehensive Final report  Final   Organism ID, Bacteria Comment  Final    Comment: No growth in 36 - 48 hours.  Resp Panel by RT-PCR (Flu A&B, Covid) Nasopharyngeal Swab     Status: None   Collection Time: 01/05/21 11:56 AM   Specimen: Nasopharyngeal Swab; Nasopharyngeal(NP) swabs in vial transport medium  Result Value Ref Range Status   SARS Coronavirus 2 by RT PCR NEGATIVE NEGATIVE Final    Comment: (NOTE) SARS-CoV-2 target nucleic acids are NOT DETECTED.  The SARS-CoV-2 RNA is generally detectable in upper respiratory specimens during the acute phase of infection. The lowest concentration of SARS-CoV-2 viral copies this assay can detect is 138 copies/mL. A negative result does not preclude SARS-Cov-2 infection and should not be used as the sole basis for treatment or other patient management decisions. A negative result may occur with  improper specimen collection/handling, submission of specimen other than nasopharyngeal swab, presence of viral mutation(s) within the areas targeted by this assay, and inadequate number of viral copies(<138 copies/mL). A negative result must be combined  with clinical observations, patient history, and epidemiological information. The expected result is Negative.  Fact Sheet for Patients:  EntrepreneurPulse.com.au  Fact Sheet for Healthcare Providers:  IncredibleEmployment.be  This test is no t yet approved or cleared by the [place] FDA and  has been authorized for detection and/or diagnosis of SARS-CoV-2 by FDA under an Emergency Use Authorization (EUA). This EUA will remain  in effect (meaning this test can be used) for the duration of the COVID-19 declaration under Section 564(b)(1) of the Act, 21 U.S.C.section 360bbb-3(b)(1), unless the authorization is terminated  or revoked sooner.       Influenza A by PCR NEGATIVE NEGATIVE Final   Influenza B by PCR NEGATIVE NEGATIVE Final    Comment: (NOTE) The Xpert Xpress SARS-CoV-2/FLU/RSV plus assay is intended as an aid in the diagnosis of influenza from Nasopharyngeal swab specimens and should not be used as a sole basis for treatment. Nasal washings and aspirates are unacceptable for Xpert Xpress SARS-CoV-2/FLU/RSV testing.  Fact Sheet for Patients: EntrepreneurPulse.com.au  Fact Sheet for Healthcare Providers: IncredibleEmployment.be  This test is not yet approved or cleared by the [place] FDA and has been authorized for detection and/or diagnosis of SARS-CoV-2 by FDA under an Emergency Use Authorization (EUA). This EUA will remain in effect (meaning this test can be used) for the duration of the COVID-19 declaration under Section 564(b)(1) of the Act, 21 U.S.C. section 360bbb-3(b)(1), unless the authorization is terminated or revoked.  Performed at [facility], [address] [place], [place] [address]          Radiology Studies: No results found.      Scheduled Meds:  amLODipine  10 mg Oral Q1500   furosemide  40 mg Intravenous BID   heparin injection  (subcutaneous)  5,000 Units Subcutaneous Q12H   hydrALAZINE  25 mg Oral Q8H   insulin aspart  0-9 Units Subcutaneous TID WC   insulin glargine  14 Units Subcutaneous Daily   potassium chloride  20 mEq Oral BID   sodium chloride flush  3 mL Intravenous Q12H   tamsulosin  0.8 mg Oral QHS   Continuous Infusions:  sodium chloride       LOS: 2 days    Time spent: 25 minutes    Edwin Dada, MD [facility] 01/07/2021, 2:19 PM     Please page though [place] or Epic secure chat:  For [employer],  contact charge nurse

## 2021-01-07 NOTE — Evaluation (Signed)
Physical Therapy Evaluation Patient Details Name: Caleb Dougherty MRN: [id number] DOB: 11/19/1928 Today's Date: 01/07/2021   History of Present Illness  Mr. Lesiak is a 87 y.o. M with hx DM, HTN, melanoma, and dCHF who presented with bradycardia.     Patient evidently developed some urinary symptoms recently, as a result of penile swelling.  When he went to urology for evaluation it was discovered that he had generalized lower extremity swelling in addition to penile swelling so he was sent to his cardiologist. On arrival to the cardiologist he was found to be in second-degree heart block Mobitz type II and was referred to the ER for pacemaker placement and diuresis.  Clinical Impression  The pt presents this session with significant higher level balance deficits and potential cognitive deficits that are difficulty to assess 2/2 being hard of hearing. The pt is able to complete several outcome measures with RPE of 0/10. At this time he will require PT reassessment following pacemaker placement. PT will continue to follow.     Follow Up Recommendations Home health PT (Will reassess following pacemaker placement.)    Equipment Recommendations       Recommendations for Other Services       Precautions / Restrictions Precautions Precautions: Fall      Mobility  Bed Mobility Overal bed mobility: Needs Assistance Bed Mobility: Supine to Sit     Supine to sit: HOB elevated;Supervision     General bed mobility comments: Use of bed railings    Transfers Overall transfer level: Needs assistance Equipment used: 1 person hand held assist Transfers: Sit to/from Stand Sit to Stand: Min guard;Min assist         General transfer comment: Pt initially requiring Min A for static standing balance at initial standing.  Ambulation/Gait             General Gait Details: Ambulation limited by lines/leads.  Stairs            Wheelchair Mobility    Modified Rankin (Stroke  Patients Only)       Balance                                 Standardized Balance Assessment Standardized Balance Assessment :  (SPPB: Balance component 1/4)           Pertinent Vitals/Pain Pain Assessment: No/denies pain    Home Living Family/patient expects to be discharged to:: Private residence Living Arrangements: Spouse/significant other Available Help at Discharge: Family;Available PRN/intermittently Type of Home: House Home Access: Ramped entrance     Home Layout: One level Home Equipment: Cane - single point;Wheelchair - manual;Shower seat - built in;Grab bars - toilet;Grab bars - tub/shower;Hand held shower head Additional Comments: Information listed from previous encounters.    Prior Function                 Hand Dominance        Extremity/Trunk Assessment   Upper Extremity Assessment Upper Extremity Assessment: Overall WFL for tasks assessed    Lower Extremity Assessment Lower Extremity Assessment: Overall WFL for tasks assessed       Communication      Cognition Arousal/Alertness: Awake/alert Behavior During Therapy: WFL for tasks assessed/performed Overall Cognitive Status: Difficult to assess  General Comments: Son asked if patient was presenting at his baseline of cognition and he did not overtly answer question.      General Comments      Exercises Other Exercises Other Exercises: 5xSTS: 13.5s Other Exercises: EO/EC static standing with feet together Other Exercises: SPPB explained: pt attempted each position. He was unable to hold semi-tandem and tandem unassisted.   Assessment/Plan    PT Assessment Patient needs continued PT services  PT Problem List Decreased mobility;Decreased balance       PT Treatment Interventions Gait training;Functional mobility training;Stair training;Balance training    PT Goals (Current goals can be found in the Care Plan section)   Acute Rehab PT Goals Patient Stated Goal: get my pacemaker and go home. PT Goal Formulation: With patient Time For Goal Achievement: 01/21/21 Potential to Achieve Goals: Good    Frequency Min 2X/week   Barriers to discharge        Co-evaluation               AM-PAC PT "6 Clicks" Mobility  Outcome Measure Help needed turning from your back to your side while in a flat bed without using bedrails?: A Little Help needed moving from lying on your back to sitting on the side of a flat bed without using bedrails?: A Little Help needed moving to and from a bed to a chair (including a wheelchair)?: A Little Help needed standing up from a chair using your arms (e.g., wheelchair or bedside chair)?: A Little Help needed to walk in hospital room?: A Lot Help needed climbing 3-5 steps with a railing? : A Lot 6 Click Score: 16    End of Session   Activity Tolerance: Patient tolerated treatment well;No increased pain Patient left: in bed Nurse Communication: Mobility status PT Visit Diagnosis: Unsteadiness on feet (R26.81);Difficulty in walking, not elsewhere classified (R26.2)    Time: 9470-9628 PT Time Calculation (min) (ACUTE ONLY): 24 min   Charges:   PT Evaluation $PT Eval Moderate Complexity: 1 Mod PT Treatments $Therapeutic Activity: 8-22 mins        4:24 PM, 01/07/21 Caelynn Marshman A. Saverio Danker PT, DPT Physical Therapist - [facility]   Eveline Sauve A Loyce Klasen 01/07/2021, 4:22 PM

## 2021-01-07 NOTE — Consult Note (Signed)
[facility] Cardiology Consultation Note  Patient ID: Caleb Dougherty, MRN: [id number], DOB/AGE: 1929/02/01 87 y.o. Admit date: 01/05/2021   Date of Consult: 01/07/2021 Primary Physician: [name], MD Primary Cardiologist: Paraschos  Chief Complaint:  Chief Complaint  Patient presents with  . Shortness of Breath  . Edema   Reason for Consult:  Heart block  HPI: 87 y.o. male with a known acute on chronic diastolic dysfunction congestive heart failure with pulmonary edema but no evidence of acute myocardial infarction or acute coronary syndrome having progression of rhythm disturbances including second-degree type II AV block with slower heart rate.  The patient has had variable symptoms of this AV block and heart rate in the range of 30 to 45 bpm range with 2-1 block at times.  He has tolerated this well without need for temporary pacemaker placement.  In addition to that the patient has had reasonable urine output of 3200 mils for his diastolic dysfunction congestive heart failure with improvements of oxygenation and less shortness of breath.  Overall he feels to be hemodynamically stable and an no evidence of significant symptoms today currently not requiring any external pacing.  Apparently he has had some external pacing and has tolerated fairly well at times.  Past Medical History:  Diagnosis Date  . Arthritis    osteoarthritis-knees and feet  . BPH (benign prostatic hypertrophy)   . Cancer [facility])    melanoma(back)-no futher issues. Skin cancers face "frozen"  . Cataract    02-12-14 left- ready for surgery, but only has had right done  . Diabetes mellitus without complication ([place])    diabetes x4 yrs-oral med and Lantus used  . HOH (hard of hearing)    wears bilateral hearing aids  . Hypertension   . Impaired hearing    hard of hearing -no hearing aids  . Urgency of urination   . Wears dentures    full lower  . Wears glasses    reading      Surgical History:  Past  Surgical History:  Procedure Laterality Date  . CARPAL TUNNEL RELEASE Right 06/23/2013   Procedure: RIGHT CARPAL TUNNEL RELEASE;  Surgeon: Cammie Sickle., MD;  Location: [place];  Service: Orthopedics;  Laterality: Right;  . CATARACT EXTRACTION Right   . CATARACT EXTRACTION W/PHACO Left 03/30/2019   Procedure: CATARACT EXTRACTION PHACO AND INTRAOCULAR LENS PLACEMENT (IOC) LEFT DIABETES  02:21.8  19.2%  28.18;  Surgeon: Eulogio Bear, MD;  Location: [place];  Service: Ophthalmology;  Laterality: Left;  diabetic - insulin  . COLONOSCOPY    . INCISION AND DRAINAGE WOUND WITH FOREIGN BODY REMOVAL Left 12/07/2014   Procedure: INCISION AND DRAINAGE WOUND WITH FOREIGN BODY REMOVAL;  Surgeon: Corky Mull, MD;  Location: [facility];  Service: Orthopedics;  Laterality: Left;  . REVERSE SHOULDER ARTHROPLASTY Left 11/19/2014   Procedure: LEFT REVERSE SHOULDER ARTHROPLASTY;  Surgeon: Netta Cedars, MD;  Location: [place];  Service: Orthopedics;  Laterality: Left;  . TOTAL KNEE ARTHROPLASTY Left 02/23/2014   Procedure: LEFT TOTAL KNEE REPLACEMENT;  Surgeon: Mauri Pole, MD;  Location: [facility];  Service: Orthopedics;  Laterality: Left;  . TOTAL KNEE ARTHROPLASTY Right 05/25/2014   Procedure: RIGHT TOTAL KNEE ARTHROPLASTY;  Surgeon: Mauri Pole, MD;  Location: [facility];  Service: Orthopedics;  Laterality: Right;     Home Meds: Prior to Admission medications   Medication Sig Start Date End Date Taking? Authorizing Provider  amLODipine (NORVASC) 10 MG tablet  Take 10 mg by mouth daily in the afternoon.   Yes [provider]  aspirin EC 81 MG tablet Take 81 mg by mouth daily.   Yes [provider]  cyanocobalamin 1000 MCG tablet Take 1 tablet (1,000 mcg total) by mouth daily. 11/18/20  Yes Love, Ivan Anchors, PA-C  furosemide (LASIX) 20 MG tablet Take 20 mg by mouth 2 (two) times daily.   Yes [provider]  hydrALAZINE (APRESOLINE) 50 MG tablet Take 50  mg by mouth 3 (three) times daily. 12/13/20  Yes [provider]  insulin glargine (LANTUS) 100 UNIT/ML Solostar Pen Inject 28 Units into the skin daily. 11/18/20  Yes Love, Ivan Anchors, PA-C  losartan (COZAAR) 50 MG tablet Take 100 mg by mouth daily.   Yes [provider]  Multiple Vitamins-Minerals (MULTIVITAMIN WITH MINERALS) tablet Take 1 tablet by mouth daily.   Yes [provider]  tamsulosin (FLOMAX) 0.4 MG CAPS capsule Take 2 capsules (0.8 mg total) by mouth at bedtime. 11/18/20  Yes Love, Ivan Anchors, PA-C  hydrALAZINE (APRESOLINE) 25 MG tablet Take 1 tablet (25 mg total) by mouth every 8 (eight) hours. Patient not taking: No sig reported 11/18/20   Bary Leriche, PA-C  losartan (COZAAR) 25 MG tablet Take 3 tablets (75 mg total) by mouth daily. Patient not taking: No sig reported 11/19/20   Bary Leriche, PA-C    Inpatient Medications:  . amLODipine  10 mg Oral Q1500  . furosemide  40 mg Intravenous BID  . heparin injection (subcutaneous)  5,000 Units Subcutaneous Q12H  . hydrALAZINE  25 mg Oral Q8H  . insulin aspart  0-9 Units Subcutaneous TID WC  . insulin glargine  14 Units Subcutaneous Daily  . potassium chloride  20 mEq Oral BID  . sodium chloride flush  3 mL Intravenous Q12H  . tamsulosin  0.8 mg Oral QHS   . sodium chloride      Allergies:  Allergies  Allergen Reactions  . Gabapentin   . Indomethacin     Other reaction(s): Edema of lower extremity  . Lisinopril Cough  . Nortriptyline     Other reaction(s): Edema of lower extremity  . Empagliflozin     Other reaction(s): Urinary incontinence    Social History   Socioeconomic History  . Marital status: Married    Spouse name: Not on file  . Number of children: Not on file  . Years of education: Not on file  . Highest education level: Not on file  Occupational History  . Not on file  Tobacco Use  . Smoking status: Never  . Smokeless tobacco: Never  Vaping Use  . Vaping Use: Never used   Substance and Sexual Activity  . Alcohol use: No  . Drug use: No  . Sexual activity: Yes  Other Topics Concern  . Not on file  Social History Narrative  . Not on file   Social Determinants of Health   Financial Resource Strain: Not on file  Food Insecurity: Not on file  Transportation Needs: Not on file  Physical Activity: Not on file  Stress: Not on file  Social Connections: Not on file  Intimate Partner Violence: Not on file     Family History  Problem Relation Age of Onset  . Cancer Father   . Cancer Sister   . Other Sister        question of ALS     Review of Systems Positive for shortness of breath Negative for:  General:  chills, fever, night sweats or weight changes.  Cardiovascular: PND orthopnea syncope dizziness  Dermatological skin lesions rashes Respiratory: Cough congestion Urologic: Frequent urination urination at night and hematuria Abdominal: negative for nausea, vomiting, diarrhea, bright red blood per rectum, melena, or hematemesis Neurologic: negative for visual changes, and/or hearing changes  All other systems reviewed and are otherwise negative except as noted above.  Labs: No results for input(s): CKTOTAL, CKMB, TROPONINI in the last 72 hours. Lab Results  Component Value Date   WBC 5.0 01/07/2021   HGB 11.3 (L) 01/07/2021   HCT 35.2 (L) 01/07/2021   MCV 92.6 01/07/2021   PLT 192 01/07/2021    Recent Labs  Lab 01/06/21 0437 01/07/21 0447  NA 142 137  K 3.5 4.0  CL 108 103  CO2 25 26  BUN 37* 35*  CREATININE 1.41* 1.24  CALCIUM 8.9 8.7*  PROT 6.2*  --   BILITOT 1.2  --   ALKPHOS 32*  --   ALT 28  --   AST 29  --   GLUCOSE 56* 174*   Lab Results  Component Value Date   CHOL 125 11/02/2020   HDL 44 11/02/2020   LDLCALC 68 11/02/2020   TRIG 65 11/02/2020   No results found for: DDIMER  Radiology/Studies:  DG Chest 2 View  Result Date: 01/05/2021 CLINICAL DATA:  Shortness of breath. EXAM: CHEST - 2 VIEW COMPARISON:  CT  chest dated November 03, 2020. Chest x-ray dated November 02, 2020. FINDINGS: Chronic cardiomegaly. Increasing interstitial thickening, small bilateral pleural effusions, and bibasilar atelectasis compared to prior studies. No pneumothorax. No acute osseous abnormality. Prior left shoulder arthroplasty. IMPRESSION: 1. Worsening congestive heart failure. Electronically Signed   By: Titus Dubin M.D.   On: 01/05/2021 13:05    EKG: Normal sinus rhythm with second-degree type II AV block  Weights: Filed Weights   01/05/21 1139 01/07/21 0409  Weight: 84 kg 80.6 kg     Physical Exam: Blood pressure (!) 157/74, pulse (!) 40, temperature 97.9 F (36.6 C), temperature source Oral, resp. rate 15, height 5\' 8"  (1.727 m), weight 80.6 kg, SpO2 95 %. Body mass index is 27 kg/m. General: Well developed, well nourished, in no acute distress. Head eyes ears nose throat: Normocephalic, atraumatic, sclera non-icteric, no xanthomas, nares are without discharge. No apparent thyromegaly and/or mass  Lungs: Normal respiratory effort.  no wheezes, no rales, no rhonchi.  Heart: RRR with normal S1 S2. no murmur gallop, no rub, PMI is normal size and placement, carotid upstroke normal without bruit, jugular venous pressure is normal Abdomen: Soft, non-tender, non-distended with normoactive bowel sounds. No hepatomegaly. No rebound/guarding. No obvious abdominal masses. Abdominal aorta is normal size without bruit Extremities: No edema. no cyanosis, no clubbing, no ulcers  Peripheral : 2+ bilateral upper extremity pulses, 2+ bilateral femoral pulses, 2+ bilateral dorsal pedal pulse Neuro: Alert and oriented. No facial asymmetry. No focal deficit. Moves all extremities spontaneously. Musculoskeletal: Normal muscle tone without kyphosis Psych:  Responds to questions appropriately with a normal affect.    Assessment: 87 year old male with known acute on chronic diastolic dysfunction congestive heart failure hypertension and  progression of heart block with slow ventricular rate currently stable without evidence of acute coronary syndrome  Plan: 1.  Continue intravenous diuresis with intravenous furosemide for acute on chronic diastolic dysfunction congestive heart failure 2.  No further diagnostic testing at this time 3.  Hypertension control with amlodipine and avoid any beta-blockers at this time due  to advanced heart block 4.  Proceed to Micra pacemaker placement for symptomatic second-degree type II AV block on Monday  Signed, Corey Skains M.D. [facility] 01/07/2021, 7:02 AM

## 2021-01-08 LAB — BASIC METABOLIC PANEL
Anion gap: 7 (ref 5–15)
BUN: 39 mg/dL — ABNORMAL HIGH (ref 8–23)
CO2: 28 mmol/L (ref 22–32)
Calcium: 9.2 mg/dL (ref 8.9–10.3)
Chloride: 104 mmol/L (ref 98–111)
Creatinine, Ser: 1.38 mg/dL — ABNORMAL HIGH (ref 0.61–1.24)
GFR, Estimated: 49 mL/min — ABNORMAL LOW (ref >60)
Glucose, Bld: 132 mg/dL — ABNORMAL HIGH (ref 70–99)
Potassium: 4.7 mmol/L (ref 3.5–5.1)
Sodium: 139 mmol/L (ref 135–145)

## 2021-01-08 LAB — GLUCOSE, CAPILLARY
Glucose-Capillary: 138 mg/dL — ABNORMAL HIGH (ref 70–99)
Glucose-Capillary: 306 mg/dL — ABNORMAL HIGH (ref 70–99)
Glucose-Capillary: 99 mg/dL (ref 70–99)
Glucose-Capillary: 92 mg/dL (ref 70–99)
Glucose-Capillary: 217 mg/dL — ABNORMAL HIGH (ref 70–99)

## 2021-01-08 MED ORDER — SODIUM CHLORIDE 0.9 % IV SOLN: INTRAVENOUS | Status: DC

## 2021-01-08 MED ORDER — SODIUM CHLORIDE 0.45 % IV SOLN: INTRAVENOUS | Status: DC

## 2021-01-08 NOTE — Progress Notes (Signed)
[facility] Encounter Note  Patient: Caleb Dougherty / Admit Date: 01/05/2021 / Date of Encounter: 01/08/2021, 6:43 AM   Subjective: No significant changes in condition overnight.  Patient continues to have telemetry with second-degree type II AV block with average heart rate in the 40 bpm range but continues to be hemodynamically stable without evidence of significant symptoms.  The patient has had significant amount of improvements of his diastolic dysfunction congestive heart failure with good urine output with greater than 5 L.  In addition to that echocardiogram has previously shown normal LV systolic function with ejection fraction of 60% and no evidence of significant valvular heart disease.  Currently is hemodynamically stable and no current concerns for the possibility of true angina  Review of Systems: Positive for: None Negative for: Vision change, hearing change, syncope, dizziness, nausea, vomiting,diarrhea, bloody stool, stomach pain, cough, congestion, diaphoresis, urinary frequency, urinary pain,skin lesions, skin rashes Others previously listed  Objective: Telemetry: Second-degree type II AV block Physical Exam: Blood pressure (!) 144/68, pulse (!) 56, temperature 98.6 F (37 C), temperature source Oral, resp. rate 12, height 5\' 8"  (1.727 m), weight 79 kg, SpO2 97 %. Body mass index is 26.48 kg/m. General: Well developed, well nourished, in no acute distress. Head: Normocephalic, atraumatic, sclera non-icteric, no xanthomas, nares are without discharge. Neck: No apparent masses Lungs: Normal respirations with no wheezes, no rhonchi, no rales , no crackles   Heart: Regular rate and rhythm, normal S1 S2, no murmur, no rub, no gallop, PMI is normal size and placement, carotid upstroke normal without bruit, jugular venous pressure normal Abdomen: Soft, non-tender, non-distended with normoactive bowel sounds. No hepatosplenomegaly. Abdominal aorta is normal  size without bruit Extremities: No edema, no clubbing, no cyanosis, no ulcers,  Peripheral: 2+ radial, 2+ femoral, 2+ dorsal pedal pulses Neuro: Alert and oriented. Moves all extremities spontaneously. Psych:  Responds to questions appropriately with a normal affect.   Intake/Output Summary (Last 24 hours) at 01/08/2021 0643 Last data filed at 01/08/2021 0619 Gross per 24 hour  Intake 150 ml  Output 2450 ml  Net -2300 ml    Inpatient Medications:  . amLODipine  10 mg Oral Q1500  . furosemide  40 mg Intravenous BID  . heparin injection (subcutaneous)  5,000 Units Subcutaneous Q12H  . hydrALAZINE  25 mg Oral Q8H  . insulin aspart  0-15 Units Subcutaneous TID WC  . insulin aspart  0-5 Units Subcutaneous QHS  . insulin glargine  21 Units Subcutaneous Daily  . potassium chloride  20 mEq Oral BID  . sodium chloride flush  3 mL Intravenous Q12H  . tamsulosin  0.8 mg Oral QHS   Infusions:  . sodium chloride      Labs: Recent Labs    01/07/21 0447 01/08/21 0430  NA 137 139  K 4.0 4.7  CL 103 104  CO2 26 28  GLUCOSE 174* 132*  BUN 35* 39*  CREATININE 1.24 1.38*  CALCIUM 8.7* 9.2   Recent Labs    01/06/21 0437  AST 29  ALT 28  ALKPHOS 32*  BILITOT 1.2  PROT 6.2*  ALBUMIN 3.4*   Recent Labs    01/06/21 0437 01/07/21 0447  WBC 4.5 5.0  HGB 11.5* 11.3*  HCT 35.5* 35.2*  MCV 91.3 92.6  PLT 184 192   No results for input(s): CKTOTAL, CKMB, TROPONINI in the last 72 hours. Invalid input(s): POCBNP No results for input(s): HGBA1C in the last 72 hours.   Weights: [employer]  Weights   01/05/21 1139 01/07/21 0409 01/08/21 0500  Weight: 84 kg 80.6 kg 79 kg     Radiology/Studies:  DG Chest 2 View  Result Date: 01/05/2021 CLINICAL DATA:  Shortness of breath. EXAM: CHEST - 2 VIEW COMPARISON:  CT chest dated November 03, 2020. Chest x-ray dated November 02, 2020. FINDINGS: Chronic cardiomegaly. Increasing interstitial thickening, small bilateral pleural effusions, and bibasilar  atelectasis compared to prior studies. No pneumothorax. No acute osseous abnormality. Prior left shoulder arthroplasty. IMPRESSION: 1. Worsening congestive heart failure. Electronically Signed   By: Titus Dubin M.D.   On: 01/05/2021 13:05     Assessment and Recommendation  87 y.o. male with acute diastolic dysfunction congestive heart failure slowly improving with hypertension hyperlipidemia and second-degree type II AV block with bradycardia needing further treatment 1.  Continue supportive care for hypertension 2.  Continue intravenous Lasix for acute diastolic dysfunction congestive heart failure improving 3.  Proceed to Micra pacemaker placement for second-degree type II AV block and symptomatic bradycardia.  Patient understands all risk and benefits of pacemaker placement including the possibility of death stroke heart attack infection bleeding blood clot hemopericardium.  The patient is at low risk for conscious sedation.  This will occur on Monday  Signed, Serafina Royals M.D. FACC

## 2021-01-08 NOTE — Progress Notes (Signed)
[facility] PROGRESS NOTE    Caleb Dougherty  IOX:[id number] DOB: Oct 02, 1928 DOA: 01/05/2021 PCP: Caleb Pink, MD      Brief Narrative:  Caleb Dougherty is a 87 y.o. M with hx DM, HTN, melanoma, and dCHF who presented with bradycardia.  Patient evidently developed some urinary symptoms recently, as a result of penile swelling.  When he went to urology for evaluation it was discovered that he had generalized lower extremity swelling in addition to penile swelling so he was sent to his cardiologist.  On arrival to the cardiologist he was found to be in second-degree heart block Mobitz type II and was referred to the ER for pacemaker placement and diuresis.       Assessment & Plan:  Acute on chronic diastolic CHF Net -1.6 L yesterday, 5.5 L on admission, still edematous, potassium and creatinine stable - Continue IV Lasix twice daily - Hold potassium supplement -Strict I/Os, daily weights, telemetry  -Daily monitoring renal function    Second-degree AV block, Mobitz type II Bradycardic overnight with symptoms, externally paced, resolved spontaneously, heart rate normal now -Consult Cardiology, appreciate cares -Monitor on tele, continue external pacer pads in place - Plan for pacer on Monday   Hypertension Blood pressure mildly elevated - Continue amlodipine, hydralazine - Hold losartan  Type 2 diabetes, well controlled, with chronic kidney disease Glucose labile - Continue Lantus, less than home dose - Continue sliding scale correction  BPH - Continue Flomax  CKD IIIa Baseline Cr 1.3 stable  Anemia of chronic kidney disease        Disposition: Status is: Inpatient  Remains inpatient appropriate because:  still requiring IV Lasix  Dispo: The patient is from: Home              Anticipated d/c is to: Home              Patient currently is not medically stable to d/c.   Difficult to place patient No       Level of care:  Progressive Cardiac       MDM: The below labs and imaging reports were reviewed and summarized above.  Medication management as above.    DVT prophylaxis: heparin injection 5,000 Units Start: 01/05/21 1545 SCDs Start: 01/05/21 1531  Code Status: DNR Family Communication: Son Caleb Dougherty by phone           Subjective: No specific complaints, no palpitations, chest pain, dyspnea, confusion.  Swelling is still present.  Objective: Vitals:   01/08/21 0500 01/08/21 0800 01/08/21 1200 01/08/21 1600  BP:  (!) 156/68 (!) 141/53 (!) 141/42  Pulse:  (!) 50 (!) 48   Resp:  16 12 14   Temp:  98.2 F (36.8 C) 98.4 F (36.9 C) 98 F (36.7 C)  TempSrc:  Oral Oral Oral  SpO2:  94% 96% 98%  Weight: 79 kg     Height:        Intake/Output Summary (Last 24 hours) at 01/08/2021 1706 Last data filed at 01/08/2021 1400 Gross per 24 hour  Intake 150 ml  Output 1450 ml  Net -1300 ml   Filed Weights   01/05/21 1139 01/07/21 0409 01/08/21 0500  Weight: 84 kg 80.6 kg 79 kg    Examination: General appearance: Elderly adult male, lying in bed, no acute distress, eating breakfast     HEENT:    Skin:  Cardiac: Slow, regular, no systolic murmurs, 2-6+ lower extremity edema bilaterally Respiratory: Normal respiratory rate and rhythm, lungs  clear without rales or wheezes Abdomen: Abdomen soft no tenderness palpation or guarding, no ascites or distention MSK: No deformities or effusions, normal muscle bulk and tone for age Neuro: Awake and alert, extraocular movements intact, moves all extremities with generalized weakness, symmetric strength, speech fluent, face symmetric Psych: Sensorium intact and responding to questions, memory appears slightly impaired, affect pleasant, attentive to our conversation.     Data Reviewed: I have personally reviewed following labs and imaging studies:  CBC: Recent Labs  Lab 01/05/21 1156 01/06/21 0437 01/07/21 0447  WBC 5.6 4.5 5.0  HGB 11.6* 11.5*  11.3*  HCT 36.3* 35.5* 35.2*  MCV 92.8 91.3 92.6  PLT 190 184 371   Basic Metabolic Panel: Recent Labs  Lab 01/05/21 1156 01/06/21 0437 01/07/21 0447 01/08/21 0430  NA 138 142 137 139  K 4.1 3.5 4.0 4.7  CL 107 108 103 104  CO2 22 25 26 28   GLUCOSE 198* 56* 174* 132*  BUN 39* 37* 35* 39*  CREATININE 1.47* 1.41* 1.24 1.38*  CALCIUM 8.8* 8.9 8.7* 9.2   GFR: Estimated Creatinine Clearance: 35.1 mL/min (A) (by C-G formula based on SCr of 1.38 mg/dL (H)). Liver Function Tests: Recent Labs  Lab 01/06/21 0437  AST 29  ALT 28  ALKPHOS 32*  BILITOT 1.2  PROT 6.2*  ALBUMIN 3.4*   No results for input(s): LIPASE, AMYLASE in the last 168 hours. No results for input(s): AMMONIA in the last 168 hours. Coagulation Profile: No results for input(s): INR, PROTIME in the last 168 hours. Cardiac Enzymes: No results for input(s): CKTOTAL, CKMB, CKMBINDEX, TROPONINI in the last 168 hours. BNP (last 3 results) No results for input(s): PROBNP in the last 8760 hours. HbA1C: No results for input(s): HGBA1C in the last 72 hours. CBG: Recent Labs  Lab 01/07/21 2057 01/08/21 0750 01/08/21 1156 01/08/21 1608 01/08/21 1643  GLUCAP 163* 138* 306* 99 92   Lipid Profile: No results for input(s): CHOL, HDL, LDLCALC, TRIG, CHOLHDL, LDLDIRECT in the last 72 hours. Thyroid Function Tests: No results for input(s): TSH, T4TOTAL, FREET4, T3FREE, THYROIDAB in the last 72 hours. Anemia Panel: No results for input(s): VITAMINB12, FOLATE, FERRITIN, TIBC, IRON, RETICCTPCT in the last 72 hours. Urine analysis:    Component Value Date/Time   COLORURINE YELLOW 11/13/2020 0900   APPEARANCEUR Cloudy (A) 01/03/2021 1342   LABSPEC 1.010 11/13/2020 0900   PHURINE 6.0 11/13/2020 0900   GLUCOSEU Negative 01/03/2021 1342   HGBUR NEGATIVE 11/13/2020 0900   BILIRUBINUR Negative 01/03/2021 1342   KETONESUR NEGATIVE 11/13/2020 0900   PROTEINUR 1+ (A) 01/03/2021 1342   PROTEINUR 100 (A) 11/13/2020 0900    UROBILINOGEN 0.2 05/21/2014 0915   NITRITE Negative 01/03/2021 1342   NITRITE NEGATIVE 11/13/2020 0900   LEUKOCYTESUR Negative 01/03/2021 1342   LEUKOCYTESUR NEGATIVE 11/13/2020 0900   Sepsis Labs: @LABRCNTIP (procalcitonin:4,lacticacidven:4)  ) Recent Results (from the past 240 hour(s))  Microscopic Examination     Status: Abnormal   Collection Time: 01/03/21  1:42 PM   Urine  Result Value Ref Range Status   WBC, UA 0-5 0 - 5 /hpf Final   RBC 0-2 0 - 2 /hpf Final   Epithelial Cells (non renal) 0-10 0 - 10 /hpf Final   Renal Epithel, UA 0-10 (A) None seen /hpf Final   Bacteria, UA Moderate (A) None seen/Few Final  CULTURE, URINE COMPREHENSIVE     Status: None   Collection Time: 01/03/21  1:55 PM   Specimen: Urine   UR  Result  Value Ref Range Status   Urine Culture, Comprehensive Final report  Final   Organism ID, Bacteria Comment  Final    Comment: No growth in 36 - 48 hours.  Resp Panel by RT-PCR (Flu A&B, Covid) Nasopharyngeal Swab     Status: None   Collection Time: 01/05/21 11:56 AM   Specimen: Nasopharyngeal Swab; Nasopharyngeal(NP) swabs in vial transport medium  Result Value Ref Range Status   SARS Coronavirus 2 by RT PCR NEGATIVE NEGATIVE Final    Comment: (NOTE) SARS-CoV-2 target nucleic acids are NOT DETECTED.  The SARS-CoV-2 RNA is generally detectable in upper respiratory specimens during the acute phase of infection. The lowest concentration of SARS-CoV-2 viral copies this assay can detect is 138 copies/mL. A negative result does not preclude SARS-Cov-2 infection and should not be used as the sole basis for treatment or other patient management decisions. A negative result may occur with  improper specimen collection/handling, submission of specimen other than nasopharyngeal swab, presence of viral mutation(s) within the areas targeted by this assay, and inadequate number of viral copies(<138 copies/mL). A negative result must be combined with clinical  observations, patient history, and epidemiological information. The expected result is Negative.  Fact Sheet for Patients:  EntrepreneurPulse.com.au  Fact Sheet for Healthcare Providers:  IncredibleEmployment.be  This test is no t yet approved or cleared by the [place] FDA and  has been authorized for detection and/or diagnosis of SARS-CoV-2 by FDA under an Emergency Use Authorization (EUA). This EUA will remain  in effect (meaning this test can be used) for the duration of the COVID-19 declaration under Section 564(b)(1) of the Act, 21 U.S.C.section 360bbb-3(b)(1), unless the authorization is terminated  or revoked sooner.       Influenza A by PCR NEGATIVE NEGATIVE Final   Influenza B by PCR NEGATIVE NEGATIVE Final    Comment: (NOTE) The Xpert Xpress SARS-CoV-2/FLU/RSV plus assay is intended as an aid in the diagnosis of influenza from Nasopharyngeal swab specimens and should not be used as a sole basis for treatment. Nasal washings and aspirates are unacceptable for Xpert Xpress SARS-CoV-2/FLU/RSV testing.  Fact Sheet for Patients: EntrepreneurPulse.com.au  Fact Sheet for Healthcare Providers: IncredibleEmployment.be  This test is not yet approved or cleared by the [place] FDA and has been authorized for detection and/or diagnosis of SARS-CoV-2 by FDA under an Emergency Use Authorization (EUA). This EUA will remain in effect (meaning this test can be used) for the duration of the COVID-19 declaration under Section 564(b)(1) of the Act, 21 U.S.C. section 360bbb-3(b)(1), unless the authorization is terminated or revoked.  Performed at [facility], [address], [place], [place] [address]          Radiology Studies: No results found.      Scheduled Meds:  amLODipine  10 mg Oral Q1500   furosemide  40 mg Intravenous BID   heparin injection (subcutaneous)  5,000  Units Subcutaneous Q12H   hydrALAZINE  25 mg Oral Q8H   insulin aspart  0-15 Units Subcutaneous TID WC   insulin aspart  0-5 Units Subcutaneous QHS   insulin glargine  21 Units Subcutaneous Daily   sodium chloride flush  3 mL Intravenous Q12H   tamsulosin  0.8 mg Oral QHS   Continuous Infusions:  sodium chloride       LOS: 3 days    Time spent: 25 minutes    Edwin Dada, MD [facility] 01/08/2021, 5:06 PM     Please page though [place] or  Epic secure chat:  For [employer], Adult nurse

## 2021-01-09 ENCOUNTER — Encounter: Payer: Self-pay | Admitting: Anesthesiology

## 2021-01-09 ENCOUNTER — Encounter
Admission: EM | Disposition: A | Discharge status: Home Health | Payer: Self-pay | Source: Home / Self Care | Attending: Family Medicine

## 2021-01-09 ENCOUNTER — Encounter: Payer: Self-pay | Admitting: Internal Medicine

## 2021-01-09 HISTORY — PX: PACEMAKER LEADLESS INSERTION: EP1219

## 2021-01-09 LAB — BASIC METABOLIC PANEL
Anion gap: 8 (ref 5–15)
BUN: 49 mg/dL — ABNORMAL HIGH (ref 8–23)
CO2: 30 mmol/L (ref 22–32)
Calcium: 8.8 mg/dL — ABNORMAL LOW (ref 8.9–10.3)
Chloride: 95 mmol/L — ABNORMAL LOW (ref 98–111)
Creatinine, Ser: 1.56 mg/dL — ABNORMAL HIGH (ref 0.61–1.24)
GFR, Estimated: 42 mL/min — ABNORMAL LOW (ref >60)
Glucose, Bld: 175 mg/dL — ABNORMAL HIGH (ref 70–99)
Potassium: 4.6 mmol/L (ref 3.5–5.1)
Sodium: 133 mmol/L — ABNORMAL LOW (ref 135–145)

## 2021-01-09 LAB — HEMOGLOBIN A1C
Hgb A1c MFr Bld: 7.9 % — ABNORMAL HIGH (ref 4.8–5.6)
Mean Plasma Glucose: 180 mg/dL

## 2021-01-09 LAB — GLUCOSE, CAPILLARY
Glucose-Capillary: 161 mg/dL — ABNORMAL HIGH (ref 70–99)
Glucose-Capillary: 151 mg/dL — ABNORMAL HIGH (ref 70–99)
Glucose-Capillary: 164 mg/dL — ABNORMAL HIGH (ref 70–99)
Glucose-Capillary: 336 mg/dL — ABNORMAL HIGH (ref 70–99)

## 2021-01-09 LAB — SURGICAL PCR SCREEN
MRSA, PCR: NEGATIVE
Staphylococcus aureus: POSITIVE — AB

## 2021-01-09 SURGERY — PACEMAKER LEADLESS INSERTION
Anesthesia: Moderate Sedation

## 2021-01-09 MED ORDER — ACETAMINOPHEN 325 MG PO TABS: 650.0000 mg | ORAL_TABLET | ORAL | Status: DC | PRN

## 2021-01-09 MED ORDER — HEPARIN (PORCINE) IN NACL 1000-0.9 UT/500ML-% IV SOLN
INTRAVENOUS | Status: AC
  Filled 2021-01-09: qty 1000

## 2021-01-09 MED ORDER — HEPARIN (PORCINE) IN NACL 2000-0.9 UNIT/L-% IV SOLN
INTRAVENOUS | Status: DC | PRN
  Administered 2021-01-09: 1000 mL

## 2021-01-09 MED ORDER — MIDAZOLAM HCL 2 MG/2ML IJ SOLN
INTRAMUSCULAR | Status: DC | PRN
Start: 2021-01-09 — End: 2021-01-09
  Administered 2021-01-09 (×2): 0.5 mg via INTRAVENOUS

## 2021-01-09 MED ORDER — ONDANSETRON HCL 4 MG/2ML IJ SOLN
4.0000 mg | Freq: Four times a day (QID) | INTRAMUSCULAR | Status: DC | PRN
Start: 2021-01-09 — End: 2021-01-10

## 2021-01-09 MED ORDER — FENTANYL CITRATE (PF) 100 MCG/2ML IJ SOLN
INTRAMUSCULAR | Status: DC | PRN
  Administered 2021-01-09 (×2): 12.5 ug via INTRAVENOUS

## 2021-01-09 MED ORDER — SODIUM CHLORIDE 0.9% FLUSH
3.0000 mL | Freq: Two times a day (BID) | INTRAVENOUS | Status: DC
  Administered 2021-01-09 – 2021-01-10 (×3): 3 mL via INTRAVENOUS

## 2021-01-09 MED ORDER — MIDAZOLAM HCL 2 MG/2ML IJ SOLN
INTRAMUSCULAR | Status: AC
  Filled 2021-01-09: qty 2

## 2021-01-09 MED ORDER — HEPARIN SODIUM (PORCINE) 1000 UNIT/ML IJ SOLN
INTRAMUSCULAR | Status: AC
  Filled 2021-01-09: qty 1

## 2021-01-09 MED ORDER — HEPARIN SODIUM (PORCINE) 1000 UNIT/ML IJ SOLN
INTRAMUSCULAR | Status: DC | PRN
  Administered 2021-01-09: 4000 [IU] via INTRAVENOUS

## 2021-01-09 MED ORDER — FENTANYL CITRATE (PF) 100 MCG/2ML IJ SOLN
INTRAMUSCULAR | Status: AC
  Filled 2021-01-09: qty 2

## 2021-01-09 MED ORDER — SODIUM CHLORIDE 0.9% FLUSH: 3.0000 mL | INTRAVENOUS | Status: DC | PRN

## 2021-01-09 MED ORDER — MUPIROCIN 2 % EX OINT
1.0000 "application " | TOPICAL_OINTMENT | Freq: Two times a day (BID) | CUTANEOUS | Status: DC
  Administered 2021-01-09 – 2021-01-10 (×3): 1 via NASAL
  Filled 2021-01-09: qty 22

## 2021-01-09 MED ORDER — LIDOCAINE HCL (PF) 1 % IJ SOLN
INTRAMUSCULAR | Status: AC
  Filled 2021-01-09: qty 30

## 2021-01-09 MED ORDER — SODIUM CHLORIDE 0.9 % IV SOLN: 250.0000 mL | INTRAVENOUS | Status: DC | PRN

## 2021-01-09 MED ORDER — IOHEXOL 300 MG/ML  SOLN
INTRAMUSCULAR | Status: DC | PRN
Start: 2021-01-09 — End: 2021-01-09
  Administered 2021-01-09: 10 mL

## 2021-01-09 MED ORDER — HYDRALAZINE HCL 25 MG PO TABS: 25.0000 mg | ORAL_TABLET | Freq: Three times a day (TID) | ORAL | Status: DC | PRN

## 2021-01-09 MED ORDER — CHLORHEXIDINE GLUCONATE CLOTH 2 % EX PADS
6.0000 | MEDICATED_PAD | Freq: Every day | CUTANEOUS | Status: DC
  Administered 2021-01-09: 6 via TOPICAL

## 2021-01-09 SURGICAL SUPPLY — 13 items
DILATOR VESSEL 38 20CM 12FR (INTRODUCER) ×2 IMPLANT
DILATOR VESSEL 38 20CM 14FR (INTRODUCER) ×3 IMPLANT
DILATOR VESSEL 38 20CM 18FR (INTRODUCER) ×3 IMPLANT
DILATOR VESSEL 38 20CM 8FR (INTRODUCER) ×2 IMPLANT
MICRA AV TRANSCATH PACING SYS (Pacemaker) ×3 IMPLANT
MICRA INTRODUCER SHEATH (SHEATH) ×3
NDL PERC 18GX7CM (NEEDLE) IMPLANT
NEEDLE PERC 18GX7CM (NEEDLE) ×3 IMPLANT
PACK CARDIAC CATH (CUSTOM PROCEDURE TRAY) ×3 IMPLANT
SHEATH AVANTI 7FRX11 (SHEATH) ×3 IMPLANT
SHEATH INTRODUCER MICRA (SHEATH) ×1 IMPLANT
SYSTEM PACING TRNSCTH AV MICRA (Pacemaker) IMPLANT
WIRE AMPLATZ SS-J .035X180CM (WIRE) ×3 IMPLANT

## 2021-01-09 NOTE — Progress Notes (Signed)
[facility] Encounter Note  Patient: Caleb Dougherty / Admit Date: 01/05/2021 / Date of Encounter: 01/09/2021, 8:37 AM   Subjective: No significant changes in condition overnight.  Patient continues to have telemetry with second-degree type II AV block with average heart rate in the 40 bpm range but continues to be hemodynamically stable without evidence of significant symptoms.  The patient has had significant amount of improvements of his diastolic dysfunction congestive heart failure with good urine output with greater than 5 L.  In addition to that echocardiogram has previously shown normal LV systolic function with ejection fraction of 60% and no evidence of significant valvular heart disease.  Currently is hemodynamically stable and no current concerns for the possibility of true angina  Review of Systems: Positive for: None Negative for: Vision change, hearing change, syncope, dizziness, nausea, vomiting,diarrhea, bloody stool, stomach pain, cough, congestion, diaphoresis, urinary frequency, urinary pain,skin lesions, skin rashes Others previously listed  Objective: Telemetry: Second-degree type II AV block Physical Exam: Blood pressure 139/89, pulse (!) 52, temperature 98.1 F (36.7 C), temperature source Oral, resp. rate 14, height 5\' 8"  (1.727 m), weight 75.5 kg, SpO2 97 %. Body mass index is 25.31 kg/m. General: Well developed, well nourished, in no acute distress. Head: Normocephalic, atraumatic, sclera non-icteric, no xanthomas, nares are without discharge. Neck: No apparent masses Lungs: Normal respirations with no wheezes, no rhonchi, no rales , no crackles   Heart: Regular rate and rhythm, normal S1 S2, no murmur, no rub, no gallop, PMI is normal size and placement, carotid upstroke normal without bruit, jugular venous pressure normal Abdomen: Soft, non-tender, non-distended with normoactive bowel sounds. No hepatosplenomegaly. Abdominal aorta is normal  size without bruit Extremities: No edema, no clubbing, no cyanosis, no ulcers,  Peripheral: 2+ radial, 2+ femoral, 2+ dorsal pedal pulses Neuro: Alert and oriented. Moves all extremities spontaneously. Psych:  Responds to questions appropriately with a normal affect.   Intake/Output Summary (Last 24 hours) at 01/09/2021 0837 Last data filed at 01/09/2021 0747 Gross per 24 hour  Intake 240 ml  Output 2500 ml  Net -2260 ml     Inpatient Medications:  . amLODipine  10 mg Oral Q1500  . Chlorhexidine Gluconate Cloth  6 each Topical Q0600  . heparin injection (subcutaneous)  5,000 Units Subcutaneous Q12H  . hydrALAZINE  25 mg Oral Q8H  . insulin aspart  0-15 Units Subcutaneous TID WC  . insulin aspart  0-5 Units Subcutaneous QHS  . insulin glargine  21 Units Subcutaneous Daily  . mupirocin ointment  1 application Nasal BID  . sodium chloride flush  3 mL Intravenous Q12H  . tamsulosin  0.8 mg Oral QHS   Infusions:  . sodium chloride    . sodium chloride 20 mL/hr at 01/09/21 0544  . sodium chloride      Labs: Recent Labs    01/08/21 0430 01/09/21 0547  NA 139 133*  K 4.7 4.6  CL 104 95*  CO2 28 30  GLUCOSE 132* 175*  BUN 39* 49*  CREATININE 1.38* 1.56*  CALCIUM 9.2 8.8*    No results for input(s): AST, ALT, ALKPHOS, BILITOT, PROT, ALBUMIN in the last 72 hours.  Recent Labs    01/07/21 0447  WBC 5.0  HGB 11.3*  HCT 35.2*  MCV 92.6  PLT 192    No results for input(s): CKTOTAL, CKMB, TROPONINI in the last 72 hours. Invalid input(s): POCBNP No results for input(s): HGBA1C in the last 72 hours.   Weights:  Filed Weights   01/07/21 0409 01/08/21 0500 01/09/21 0411  Weight: 80.6 kg 79 kg 75.5 kg     Radiology/Studies:  DG Chest 2 View  Result Date: 01/05/2021 CLINICAL DATA:  Shortness of breath. EXAM: CHEST - 2 VIEW COMPARISON:  CT chest dated November 03, 2020. Chest x-ray dated November 02, 2020. FINDINGS: Chronic cardiomegaly. Increasing interstitial thickening,  small bilateral pleural effusions, and bibasilar atelectasis compared to prior studies. No pneumothorax. No acute osseous abnormality. Prior left shoulder arthroplasty. IMPRESSION: 1. Worsening congestive heart failure. Electronically Signed   By: Titus Dubin M.D.   On: 01/05/2021 13:05     Assessment and Recommendation  87 y.o. male with acute diastolic dysfunction congestive heart failure slowly improving with hypertension hyperlipidemia and second-degree type II AV block with bradycardia needing further treatment 1.  Continue supportive care for hypertension 2.  Continue intravenous Lasix for acute diastolic dysfunction congestive heart failure improving 3.  Proceed to Micra pacemaker placement for second-degree type II AV block and symptomatic bradycardia.  Patient understands all risk and benefits of pacemaker placement including the possibility of death stroke heart attack infection bleeding blood clot hemopericardium.  The patient is at low risk for conscious sedation.  Patient scheduled for Micra pacemaker today  Signed, Serafina Royals M.D. FACC

## 2021-01-09 NOTE — Progress Notes (Signed)
[facility] PROGRESS NOTE    Caleb Dougherty  JSH:[id number] DOB: October 17, 1928 DOA: 01/05/2021 PCP: Caleb Pink, MD      Brief Narrative:  Mr. Caleb Dougherty is a 87 y.o. M with hx DM, HTN, melanoma, and dCHF who presented with bradycardia.  Patient evidently developed some urinary symptoms recently, as a result of penile swelling.  When he went to urology for evaluation it was discovered that he had generalized lower extremity swelling in addition to penile swelling so he was sent to his cardiologist.  On arrival to the cardiologist he was found to be in second-degree heart block Mobitz type II and was referred to the ER for pacemaker placement and diuresis.       Assessment & Plan:  Acute on chronic diastolic CHF Net negative 1.9 yesterday, 7.4 on admission.  Cr and BUN slightly up.    -Hold Lasix today -Pacer to be placed this morning -Strict I/Os, daily weights, telemetry  -Daily monitoring renal function    Second-degree AV block, Mobitz type II Asymptomatic -Consult Cardiology, appreciate cares - Plan for pacer today   Hypertension BP elevated - Continue amlodipine, hdyralazine - Hold losartan  Type 2 diabetes, well controlled, with chronic kidney disease Glucoses labile - Continue Lantus - Continue sliding scale correction  BPH - Continue Flomax  CKD IIIa Slight bump in Cr, not AKI  Anemia of chronic kidney disease        Disposition: Status is: Inpatient  Remains inpatient appropriate because:  pacemaker today and still edematous  Dispo: The patient is from: Home              Anticipated d/c is to: Home              Patient currently is not medically stable to d/c.   Difficult to place patient No       Level of care: Progressive Cardiac       MDM: The below labs and imaging reports were reviewed and summarized above.  Medication management as above.    DVT prophylaxis: heparin injection 5,000 Units Start:  01/05/21 1545 SCDs Start: 01/05/21 1531  Code Status: DNR Family Communication: Son Caleb Dougherty by phone           Subjective: Feels well, mild swelling, no dyspnea, orthopnea, chest discomfort.  Objective: Vitals:   01/09/21 1430 01/09/21 1445 01/09/21 1500 01/09/21 1515  BP: (!) 184/89 (!) 159/66 (!) 181/68 (!) 167/54  Pulse: 80 67 60   Resp: 18 15 15 18   Temp:      TempSrc:      SpO2: 98% 90% 91% 92%  Weight:      Height:        Intake/Output Summary (Last 24 hours) at 01/09/2021 1638 Last data filed at 01/09/2021 1437 Gross per 24 hour  Intake 240 ml  Output 2400 ml  Net -2160 ml   Filed Weights   01/07/21 0409 01/08/21 0500 01/09/21 0411  Weight: 80.6 kg 79 kg 75.5 kg    Examination: General appearance: Elderly adult male, lying in bed, no acute distress     HEENT:    Skin:  Cardiac: Slow, regular, no systolic murmur, 1+ lower extremity edema, mild up to the shin, improved from previous Respiratory: Normal respiratory rate and rhythm, lungs clear without rales or wheezes Abdomen: Abdomen soft without tenderness palpation or guarding, no ascites or distention MSK:  Neuro: Awake and alert, extraocular movements intact, moves all extremities with generalized weakness but symmetric  strength, speech fluent, face symmetric, ambulated to the bathroom independently Psych: Sensorium intact and responding to questions, memory seems slightly impaired, affect pleasant, responds appropriate to questions        Data Reviewed: I have personally reviewed following labs and imaging studies:  CBC: Recent Labs  Lab 01/05/21 1156 01/06/21 0437 01/07/21 0447  WBC 5.6 4.5 5.0  HGB 11.6* 11.5* 11.3*  HCT 36.3* 35.5* 35.2*  MCV 92.8 91.3 92.6  PLT 190 184 213   Basic Metabolic Panel: Recent Labs  Lab 01/05/21 1156 01/06/21 0437 01/07/21 0447 01/08/21 0430 01/09/21 0547  NA 138 142 137 139 133*  K 4.1 3.5 4.0 4.7 4.6  CL 107 108 103 104 95*  CO2 22 25 26 28 30    GLUCOSE 198* 56* 174* 132* 175*  BUN 39* 37* 35* 39* 49*  CREATININE 1.47* 1.41* 1.24 1.38* 1.56*  CALCIUM 8.8* 8.9 8.7* 9.2 8.8*   GFR: Estimated Creatinine Clearance: 30.4 mL/min (A) (by C-G formula based on SCr of 1.56 mg/dL (H)). Liver Function Tests: Recent Labs  Lab 01/06/21 0437  AST 29  ALT 28  ALKPHOS 32*  BILITOT 1.2  PROT 6.2*  ALBUMIN 3.4*   No results for input(s): LIPASE, AMYLASE in the last 168 hours. No results for input(s): AMMONIA in the last 168 hours. Coagulation Profile: No results for input(s): INR, PROTIME in the last 168 hours. Cardiac Enzymes: No results for input(s): CKTOTAL, CKMB, CKMBINDEX, TROPONINI in the last 168 hours. BNP (last 3 results) No results for input(s): PROBNP in the last 8760 hours. HbA1C: Recent Labs    01/07/21 0447  HGBA1C 7.9*   CBG: Recent Labs  Lab 01/08/21 1608 01/08/21 1643 01/08/21 2117 01/09/21 0751 01/09/21 1142  GLUCAP 99 92 217* 161* 151*   Lipid Profile: No results for input(s): CHOL, HDL, LDLCALC, TRIG, CHOLHDL, LDLDIRECT in the last 72 hours. Thyroid Function Tests: No results for input(s): TSH, T4TOTAL, FREET4, T3FREE, THYROIDAB in the last 72 hours. Anemia Panel: No results for input(s): VITAMINB12, FOLATE, FERRITIN, TIBC, IRON, RETICCTPCT in the last 72 hours. Urine analysis:    Component Value Date/Time   COLORURINE YELLOW 11/13/2020 0900   APPEARANCEUR Cloudy (A) 01/03/2021 1342   LABSPEC 1.010 11/13/2020 0900   PHURINE 6.0 11/13/2020 0900   GLUCOSEU Negative 01/03/2021 1342   HGBUR NEGATIVE 11/13/2020 0900   BILIRUBINUR Negative 01/03/2021 1342   KETONESUR NEGATIVE 11/13/2020 0900   PROTEINUR 1+ (A) 01/03/2021 1342   PROTEINUR 100 (A) 11/13/2020 0900   UROBILINOGEN 0.2 05/21/2014 0915   NITRITE Negative 01/03/2021 1342   NITRITE NEGATIVE 11/13/2020 0900   LEUKOCYTESUR Negative 01/03/2021 1342   LEUKOCYTESUR NEGATIVE 11/13/2020 0900   Sepsis  Labs: @LABRCNTIP (procalcitonin:4,lacticacidven:4)  ) Recent Results (from the past 240 hour(s))  Microscopic Examination     Status: Abnormal   Collection Time: 01/03/21  1:42 PM   Urine  Result Value Ref Range Status   WBC, UA 0-5 0 - 5 /hpf Final   RBC 0-2 0 - 2 /hpf Final   Epithelial Cells (non renal) 0-10 0 - 10 /hpf Final   Renal Epithel, UA 0-10 (A) None seen /hpf Final   Bacteria, UA Moderate (A) None seen/Few Final  CULTURE, URINE COMPREHENSIVE     Status: None   Collection Time: 01/03/21  1:55 PM   Specimen: Urine   UR  Result Value Ref Range Status   Urine Culture, Comprehensive Final report  Final   Organism ID, Bacteria Comment  Final  Comment: No growth in 36 - 48 hours.  Resp Panel by RT-PCR (Flu A&B, Covid) Nasopharyngeal Swab     Status: None   Collection Time: 01/05/21 11:56 AM   Specimen: Nasopharyngeal Swab; Nasopharyngeal(NP) swabs in vial transport medium  Result Value Ref Range Status   SARS Coronavirus 2 by RT PCR NEGATIVE NEGATIVE Final    Comment: (NOTE) SARS-CoV-2 target nucleic acids are NOT DETECTED.  The SARS-CoV-2 RNA is generally detectable in upper respiratory specimens during the acute phase of infection. The lowest concentration of SARS-CoV-2 viral copies this assay can detect is 138 copies/mL. A negative result does not preclude SARS-Cov-2 infection and should not be used as the sole basis for treatment or other patient management decisions. A negative result may occur with  improper specimen collection/handling, submission of specimen other than nasopharyngeal swab, presence of viral mutation(s) within the areas targeted by this assay, and inadequate number of viral copies(<138 copies/mL). A negative result must be combined with clinical observations, patient history, and epidemiological information. The expected result is Negative.  Fact Sheet for Patients:  EntrepreneurPulse.com.au  Fact Sheet for Healthcare  Providers:  IncredibleEmployment.be  This test is no t yet approved or cleared by the [place] FDA and  has been authorized for detection and/or diagnosis of SARS-CoV-2 by FDA under an Emergency Use Authorization (EUA). This EUA will remain  in effect (meaning this test can be used) for the duration of the COVID-19 declaration under Section 564(b)(1) of the Act, 21 U.S.C.section 360bbb-3(b)(1), unless the authorization is terminated  or revoked sooner.       Influenza A by PCR NEGATIVE NEGATIVE Final   Influenza B by PCR NEGATIVE NEGATIVE Final    Comment: (NOTE) The Xpert Xpress SARS-CoV-2/FLU/RSV plus assay is intended as an aid in the diagnosis of influenza from Nasopharyngeal swab specimens and should not be used as a sole basis for treatment. Nasal washings and aspirates are unacceptable for Xpert Xpress SARS-CoV-2/FLU/RSV testing.  Fact Sheet for Patients: EntrepreneurPulse.com.au  Fact Sheet for Healthcare Providers: IncredibleEmployment.be  This test is not yet approved or cleared by the [place] FDA and has been authorized for detection and/or diagnosis of SARS-CoV-2 by FDA under an Emergency Use Authorization (EUA). This EUA will remain in effect (meaning this test can be used) for the duration of the COVID-19 declaration under Section 564(b)(1) of the Act, 21 U.S.C. section 360bbb-3(b)(1), unless the authorization is terminated or revoked.  Performed at [facility], [address], [place], [place] [address]   Surgical PCR screen     Status: Abnormal   Collection Time: 01/08/21 11:05 PM   Specimen: Nasal Mucosa; Nasal Swab  Result Value Ref Range Status   MRSA, PCR NEGATIVE NEGATIVE Final   Staphylococcus aureus POSITIVE (A) NEGATIVE Final    Comment: (NOTE) The Xpert SA Assay (FDA approved for NASAL specimens in patients 63 years of age and older), is one component of a  comprehensive surveillance program. It is not intended to diagnose infection nor to guide or monitor treatment. Performed at [facility], [address] [place], [place] [address]          Radiology Studies: EP PPM/ICD IMPLANT  Result Date: 01/09/2021 Successful Micra AV leadless pacemaker implantation       Scheduled Meds:  amLODipine  10 mg Oral Q1500   Chlorhexidine Gluconate Cloth  6 each Topical Q0600   heparin injection (subcutaneous)  5,000 Units Subcutaneous Q12H   hydrALAZINE  25 mg Oral Q8H  insulin aspart  0-15 Units Subcutaneous TID WC   insulin aspart  0-5 Units Subcutaneous QHS   insulin glargine  21 Units Subcutaneous Daily   mupirocin ointment  1 application Nasal BID   sodium chloride flush  3 mL Intravenous Q12H   sodium chloride flush  3 mL Intravenous Q12H   tamsulosin  0.8 mg Oral QHS   Continuous Infusions:  sodium chloride     sodium chloride       LOS: 4 days    Time spent: 25  minutes    Edwin Dada, MD [facility] 01/09/2021, 4:38 PM     Please page though [place] or Epic secure chat:  For [employer], Adult nurse

## 2021-01-09 NOTE — Progress Notes (Signed)
PT Cancellation Note  Patient Details Name: Caleb Dougherty MRN: [id number] DOB: 1928-08-04   Cancelled Treatment:    Reason Eval/Treat Not Completed: Other (comment). Pt currently off unit for pacemaker placement. Per rounds, pt has been ambulatory in room. Will re-attempt next available time/date.   Felicia Bloomquist 01/09/2021, 12:16 PM Greggory Stallion, PT, DPT [phone]

## 2021-01-10 ENCOUNTER — Encounter: Payer: Self-pay | Admitting: Cardiology

## 2021-01-10 LAB — GLUCOSE, CAPILLARY: Glucose-Capillary: 71 mg/dL (ref 70–99)

## 2021-01-10 LAB — BASIC METABOLIC PANEL
Anion gap: 8 (ref 5–15)
BUN: 41 mg/dL — ABNORMAL HIGH (ref 8–23)
CO2: 29 mmol/L (ref 22–32)
Calcium: 9.2 mg/dL (ref 8.9–10.3)
Chloride: 102 mmol/L (ref 98–111)
Creatinine, Ser: 1.38 mg/dL — ABNORMAL HIGH (ref 0.61–1.24)
GFR, Estimated: 49 mL/min — ABNORMAL LOW (ref >60)
Glucose, Bld: 87 mg/dL (ref 70–99)
Potassium: 5 mmol/L (ref 3.5–5.1)
Sodium: 139 mmol/L (ref 135–145)

## 2021-01-10 NOTE — Discharge Instructions (Signed)
Please avoid showering/submerging yourself in water for the next week. If your bandage gets wet or starts to fall off, replace it with another piece of gauze and the Tegaderm (clear bandage I provided). You should try to keep it covered for a week. You will follow up with Dr. Saralyn Pilar in the office in about 1 week. If you have bleeding from the site, lie down and apply firm pressure for 20 minutes, if it continues to bleed, call our office [phone]). Avoid heavy lifting, squatting (other than sitting) and strenuous activity for 1 week. You will get a call from [employer] to set up your pacemaker and the [employer] device. You do not need to bring this device with you to appointments. It is used to monitor your pacemaker from home.

## 2021-01-10 NOTE — Progress Notes (Signed)
[facility] Encounter Note  Patient: Caleb Dougherty / Admit Date: 01/05/2021 / Date of Encounter: 01/10/2021, 8:57 AM   Subjective:  Much improved with chf and heart rate. No complications from procedurre Ambulating well Review of Systems: Positive for: None Negative for: Vision change, hearing change, syncope, dizziness, nausea, vomiting,diarrhea, bloody stool, stomach pain, cough, congestion, diaphoresis, urinary frequency, urinary pain,skin lesions, skin rashes Others previously listed  Objective: Telemetry: Second-degree type II AV block Physical Exam: Blood pressure (!) 134/94, pulse 77, temperature 98.1 F (36.7 C), resp. rate 18, height 5\' 8"  (4.782 m), weight 75.5 kg, SpO2 93 %. Body mass index is 25.31 kg/m. General: Well developed, well nourished, in no acute distress. Head: Normocephalic, atraumatic, sclera non-icteric, no xanthomas, nares are without discharge. Neck: No apparent masses Lungs: Normal respirations with no wheezes, no rhonchi, no rales , no crackles   Heart: Regular rate and rhythm, normal S1 S2, no murmur, no rub, no gallop, PMI is normal size and placement, carotid upstroke normal without bruit, jugular venous pressure normal Abdomen: Soft, non-tender, non-distended with normoactive bowel sounds. No hepatosplenomegaly. Abdominal aorta is normal size without bruit Extremities: No edema, no clubbing, no cyanosis, no ulcers,  Peripheral: 2+ radial, 2+ femoral, 2+ dorsal pedal pulses Neuro: Alert and oriented. Moves all extremities spontaneously. Psych:  Responds to questions appropriately with a normal affect.   Intake/Output Summary (Last 24 hours) at 01/10/2021 0857 Last data filed at 01/10/2021 0530 Gross per 24 hour  Intake 480 ml  Output 2150 ml  Net -1670 ml     Inpatient Medications:  . amLODipine  10 mg Oral Q1500  . Chlorhexidine Gluconate Cloth  6 each Topical Q0600  . heparin injection (subcutaneous)  5,000 Units  Subcutaneous Q12H  . hydrALAZINE  25 mg Oral Q8H  . insulin aspart  0-15 Units Subcutaneous TID WC  . insulin aspart  0-5 Units Subcutaneous QHS  . insulin glargine  21 Units Subcutaneous Daily  . mupirocin ointment  1 application Nasal BID  . sodium chloride flush  3 mL Intravenous Q12H  . sodium chloride flush  3 mL Intravenous Q12H  . tamsulosin  0.8 mg Oral QHS   Infusions:  . sodium chloride    . sodium chloride      Labs: Recent Labs    01/09/21 0547 01/10/21 0455  NA 133* 139  K 4.6 5.0  CL 95* 102  CO2 30 29  GLUCOSE 175* 87  BUN 49* 41*  CREATININE 1.56* 1.38*  CALCIUM 8.8* 9.2    No results for input(s): AST, ALT, ALKPHOS, BILITOT, PROT, ALBUMIN in the last 72 hours.  No results for input(s): WBC, NEUTROABS, HGB, HCT, MCV, PLT in the last 72 hours.  No results for input(s): CKTOTAL, CKMB, TROPONINI in the last 72 hours. Invalid input(s): POCBNP No results for input(s): HGBA1C in the last 72 hours.   Weights: Filed Weights   01/07/21 0409 01/08/21 0500 01/09/21 0411  Weight: 80.6 kg 79 kg 75.5 kg     Radiology/Studies:  DG Chest 2 View  Result Date: 01/05/2021 CLINICAL DATA:  Shortness of breath. EXAM: CHEST - 2 VIEW COMPARISON:  CT chest dated November 03, 2020. Chest x-ray dated November 02, 2020. FINDINGS: Chronic cardiomegaly. Increasing interstitial thickening, small bilateral pleural effusions, and bibasilar atelectasis compared to prior studies. No pneumothorax. No acute osseous abnormality. Prior left shoulder arthroplasty. IMPRESSION: 1. Worsening congestive heart failure. Electronically Signed   By: Titus Dubin M.D.   On:  01/05/2021 13:05   EP PPM/ICD IMPLANT  Result Date: 01/09/2021 Successful Micra AV leadless pacemaker implantation    Assessment and Recommendation  87 y.o. male with acute diastolic dysfunction congestive heart failure slowly improving with hypertension hyperlipidemia and second-degree type II AV block with bradycardia  sp pacer  withut complication 1.  Continue supportive care for hypertension 2.  Oral lasix at 40mg  qd upon dc 3.  Ok for [employer] to home today with fu in 1week  Signed, Serafina Royals M.D. FACC

## 2021-01-10 NOTE — Progress Notes (Signed)
Examined patient today post leadless pacemaker implantation on 01/09/2021. ECG showed AS-VP rhythm at a rate of 75 bpm. He denies drainage or tenderness at the site. He reports feeling fine this morning and is eager to go home. Suture removed and guaze and tegaderm applied to insertion site. Care instructions discussed and included in discharge instructions. The patient is to follow up with Dr. Saralyn Pilar in 1 week.

## 2021-01-10 NOTE — Evaluation (Signed)
Physical Therapy RE-Evaluation Patient Details Name: Caleb Dougherty MRN: [id number] DOB: Apr 23, 1929 Today's Date: 01/10/2021   History of Present Illness  Mr. Clugston is a 87 y.o. M with hx DM, HTN, melanoma, and dCHF who presented with bradycardia.     Patient evidently developed some urinary symptoms recently, as a result of penile swelling.  When he went to urology for evaluation it was discovered that he had generalized lower extremity swelling in addition to penile swelling so he was sent to his cardiologist. On arrival to the cardiologist he was found to be in second-degree heart block Mobitz type II and was referred to the ER for pacemaker placement and diuresis.  Clinical Impression  Pt is a pleasant 87 year old male who was admitted for bradycardia. Pt performs transfers and ambulation with supervision using RW. Pt demonstrates deficits with strength/mobility. Vitals WNL with exertion. Would benefit from skilled PT to address above deficits and promote optimal return to PLOF. Recommend transition to [facility] upon discharge from acute hospitalization.     Follow Up Recommendations Home health PT    Equipment Recommendations  None recommended by PT    Recommendations for Other Services       Precautions / Restrictions Precautions Precautions: Fall Restrictions Weight Bearing Restrictions: No      Mobility  Bed Mobility               General bed mobility comments: not performed, received standing in room with RN    Transfers Overall transfer level: Needs assistance Equipment used: Rolling walker (2 wheeled) Transfers: Sit to/from Stand Sit to Stand: Supervision         General transfer comment: Safe technique  Ambulation/Gait Ambulation/Gait assistance: Supervision Gait Distance (Feet): 200 Feet Assistive device: Rolling walker (2 wheeled) Gait Pattern/deviations: Step-through pattern     General Gait Details: ambulated in hallway. Safe technique. Poor toe  clearance with occasional toe drag, no overt LOB. Vitals monitored; HR 91bpm and 90% on RA with exertion  Stairs            Wheelchair Mobility    Modified Rankin (Stroke Patients Only)       Balance Overall balance assessment: Needs assistance Sitting-balance support: Feet supported Sitting balance-Leahy Scale: Good     Standing balance support: Bilateral upper extremity supported Standing balance-Leahy Scale: Good                               Pertinent Vitals/Pain Pain Assessment: No/denies pain    Home Living Family/patient expects to be discharged to:: Private residence Living Arrangements: Spouse/significant other Available Help at Discharge: Family;Available 24 hours/day (will be staying with son for recovery- typically is caregiver for wife) Type of Home: House Home Access: Ramped entrance     Home Layout: One level Home Equipment: [place] - single point;Wheelchair - manual;Shower seat - built in;Grab bars - toilet;Grab bars - tub/shower;Hand held Tourist information centre manager - standard Additional Comments: Information listed from previous encounters.    Prior Function Level of Independence: Independent with assistive device(s)         Comments: pt uses RW at baseline     Hand Dominance        Extremity/Trunk Assessment   Upper Extremity Assessment Upper Extremity Assessment: Overall WFL for tasks assessed    Lower Extremity Assessment Lower Extremity Assessment: Overall WFL for tasks assessed       Communication      Cognition  Arousal/Alertness: Awake/alert Behavior During Therapy: WFL for tasks assessed/performed Overall Cognitive Status: Difficult to assess                                 General Comments: slightly confused reporting he just turned 52      General Comments      Exercises     Assessment/Plan    PT Assessment Patient needs continued PT services  PT Problem List Decreased mobility;Decreased  balance       PT Treatment Interventions Gait training;Functional mobility training;Stair training;Balance training    PT Goals (Current goals can be found in the Care Plan section)  Acute Rehab PT Goals Patient Stated Goal: get my pacemaker and go home. PT Goal Formulation: With patient Time For Goal Achievement: 01/21/21 Potential to Achieve Goals: Good    Frequency Min 2X/week   Barriers to discharge        Co-evaluation               AM-PAC PT "6 Clicks" Mobility  Outcome Measure Help needed turning from your back to your side while in a flat bed without using bedrails?: None Help needed moving from lying on your back to sitting on the side of a flat bed without using bedrails?: None Help needed moving to and from a bed to a chair (including a wheelchair)?: A Little Help needed standing up from a chair using your arms (e.g., wheelchair or bedside chair)?: A Little Help needed to walk in hospital room?: A Little Help needed climbing 3-5 steps with a railing? : A Little 6 Click Score: 20    End of Session Equipment Utilized During Treatment: Gait belt Activity Tolerance: Patient tolerated treatment well;No increased pain Patient left: in chair Nurse Communication: Mobility status PT Visit Diagnosis: Unsteadiness on feet (R26.81);Difficulty in walking, not elsewhere classified (R26.2)    Time: 1062-6948 PT Time Calculation (min) (ACUTE ONLY): 12 min   Charges:   PT Evaluation $PT Re-evaluation: 1 Re-eval          Greggory Stallion, PT, DPT [phone]   Halleigh Comes 01/10/2021, 11:57 AM

## 2021-01-10 NOTE — Discharge Summary (Signed)
Physician Discharge Summary  ONNIE HATCHEL ZOX:[id number] DOB: January 02, 1929 DOA: 01/05/2021  PCP: [name], MD  Admit date: 01/05/2021 Discharge date: 01/10/2021  Admitted From: Home  Disposition:  Home   Recommendations for Outpatient Follow-up:  Follow up with Cardiology as directed  Follow up with PCP Dr. Kary Kos in 1-2 weeks         Home Health: Recommended, patient and family declined  Equipment/Devices: None new  Discharge Condition: Fair  CODE STATUS: FULL Diet recommendation: Cardiac  Brief/Interim Summary: Mr. Basaldua is a 87 y.o. M with hx DM, HTN, melanoma, and dCHF who presented with bradycardia.   Patient evidently developed some urinary symptoms recently, as a result of penile swelling.  When he went to urology for evaluation it was discovered that he had generalized lower extremity swelling in addition to penile swelling so he was sent to his cardiologist.  On arrival to the cardiologist he was found to be in second-degree heart block Mobitz type II and was referred to the ER for pacemaker placement and diuresis.         PRINCIPAL HOSPITAL DIAGNOSIS: Acute on chronic diastolic CHF    Discharge Diagnoses:  Acute on chronic diastolic CHF Presented with cardiomegaly, pleural effusions, and bilateral edema.  Heart failure due to bradycardia. Diuresed 7.4L on admission, pacemaker placed and continued to autodiurese after pacer.          Second-degree AV block, Mobitz type II Intermittently symptomatic but BP remained normal.  Had pacer placed on Monday yesterday without complication.     Follow up with Cardiology.     Hypertension BP elevated   Type 2 diabetes, well controlled, with chronic kidney disease Glucoses labile   BPH   CKD IIIa Slight bump in Cr, AKI ruled out.   Anemia of chronic kidney disease              Discharge Instructions  Discharge Instructions     Diet - low sodium heart healthy   Complete by: As  directed    Discharge instructions   Complete by: As directed    From Dr. Loleta Books: You were admitted for slow heart rate. Your heart was so slow that your heart output dropped and you developed a flare of heart failure. We treated this with diuretics to get the extra fluid off, as much as we safely could, and now that the pace maker is in place and your heart rate is normalized, the rest of the fluid should come off.  Resume your normal home medicines, including hydralazine, losartan, furosemide, and amlodipine Resume your diabetes medicines Go see Dr. Saralyn Pilar as directed Care for the pacemaker insertion site as they directed  Go see your primary doctor asap   Increase activity slowly   Complete by: As directed       Allergies as of 01/10/2021       Reactions   Gabapentin    Indomethacin    Other reaction(s): Edema of lower extremity   Lisinopril Cough   Nortriptyline    Other reaction(s): Edema of lower extremity   Empagliflozin    Other reaction(s): Urinary incontinence        Medication List     TAKE these medications    amLODipine 10 MG tablet Commonly known as: NORVASC Take 10 mg by mouth daily in the afternoon.   aspirin EC 81 MG tablet Take 81 mg by mouth daily.   cyanocobalamin 1000 MCG tablet Take 1 tablet (1,000 mcg total) by mouth  daily.   furosemide 20 MG tablet Commonly known as: LASIX Take 20 mg by mouth 2 (two) times daily.   hydrALAZINE 50 MG tablet Commonly known as: APRESOLINE Take 50 mg by mouth 3 (three) times daily. What changed: Another medication with the same name was removed. Continue taking this medication, and follow the directions you see here.   insulin glargine 100 UNIT/ML Solostar Pen Commonly known as: LANTUS Inject 28 Units into the skin daily.   losartan 50 MG tablet Commonly known as: COZAAR Take 100 mg by mouth daily. What changed: Another medication with the same name was removed. Continue taking this medication,  and follow the directions you see here.   multivitamin with minerals tablet Take 1 tablet by mouth daily.   tamsulosin 0.4 MG Caps capsule Commonly known as: FLOMAX Take 2 capsules (0.8 mg total) by mouth at bedtime.        Follow-up Information     Isaias Cowman, MD. Go on 01/23/2021.   Specialty: Cardiology Why: Appointment at 3:30pm Contact information: [facility] [place] [address] [phone]         [name], MD. Schedule an appointment as soon as possible for a visit in 2 week(s).   Specialty: Family Medicine Why: The doctor's office with call you with a follow up appointment. Contact information: [place] [address] [phone]                Allergies  Allergen Reactions   Gabapentin    Indomethacin     Other reaction(s): Edema of lower extremity   Lisinopril Cough   Nortriptyline     Other reaction(s): Edema of lower extremity   Empagliflozin     Other reaction(s): Urinary incontinence    Consultations: Cardiology   Procedures/Studies: DG Chest 2 View  Result Date: 01/05/2021 CLINICAL DATA:  Shortness of breath. EXAM: CHEST - 2 VIEW COMPARISON:  CT chest dated November 03, 2020. Chest x-ray dated November 02, 2020. FINDINGS: Chronic cardiomegaly. Increasing interstitial thickening, small bilateral pleural effusions, and bibasilar atelectasis compared to prior studies. No pneumothorax. No acute osseous abnormality. Prior left shoulder arthroplasty. IMPRESSION: 1. Worsening congestive heart failure. Electronically Signed   By: Titus Dubin M.D.   On: 01/05/2021 13:05   EP PPM/ICD IMPLANT  Result Date: 01/09/2021 Successful Micra AV leadless pacemaker implantation       Discharge Exam: Vitals:   01/10/21 0536 01/10/21 0724  BP: (!) 156/65 (!) 134/94  Pulse: 79 77  Resp: 15 18  Temp: 97.8 F (36.6 C) 98.1 F (36.7 C)  SpO2: 96% 93%   Vitals:   01/09/21 1703  01/09/21 2049 01/10/21 0536 01/10/21 0724  BP: (!) 140/57 (!) 139/53 (!) 156/65 (!) 134/94  Pulse: 79 64 79 77  Resp: 14 20 15 18   Temp: 98 F (36.7 C) 98.1 F (36.7 C) 97.8 F (36.6 C) 98.1 F (36.7 C)  TempSrc: Oral Oral    SpO2: 93% 96% 96% 93%  Weight:      Height:       Patient unfortunately discharged prior to my ability to examine the patient on the day of discharge.   The results of significant diagnostics from this hospitalization (including imaging, microbiology, ancillary and laboratory) are listed below for reference.     Microbiology: Recent Results (from the past 240 hour(s))  Microscopic Examination     Status: Abnormal   Collection Time: 01/03/21  1:42 PM   Urine  Result Value Ref Range Status   WBC, UA 0-5 0 - 5 /hpf Final   RBC 0-2 0 - 2 /hpf Final   Epithelial Cells (non renal) 0-10 0 - 10 /hpf Final   Renal Epithel, UA 0-10 (A) None seen /hpf Final   Bacteria, UA Moderate (A) None seen/Few Final  CULTURE, URINE COMPREHENSIVE     Status: None   Collection Time: 01/03/21  1:55 PM   Specimen: Urine   UR  Result Value Ref Range Status   Urine Culture, Comprehensive Final report  Final   Organism ID, Bacteria Comment  Final    Comment: No growth in 36 - 48 hours.  Resp Panel by RT-PCR (Flu A&B, Covid) Nasopharyngeal Swab     Status: None   Collection Time: 01/05/21 11:56 AM   Specimen: Nasopharyngeal Swab; Nasopharyngeal(NP) swabs in vial transport medium  Result Value Ref Range Status   SARS Coronavirus 2 by RT PCR NEGATIVE NEGATIVE Final    Comment: (NOTE) SARS-CoV-2 target nucleic acids are NOT DETECTED.  The SARS-CoV-2 RNA is generally detectable in upper respiratory specimens during the acute phase of infection. The lowest concentration of SARS-CoV-2 viral copies this assay can detect is 138 copies/mL. A negative result does not preclude SARS-Cov-2 infection and should not be used as the sole basis for treatment or other patient management  decisions. A negative result may occur with  improper specimen collection/handling, submission of specimen other than nasopharyngeal swab, presence of viral mutation(s) within the areas targeted by this assay, and inadequate number of viral copies(<138 copies/mL). A negative result must be combined with clinical observations, patient history, and epidemiological information. The expected result is Negative.  Fact Sheet for Patients:  EntrepreneurPulse.com.au  Fact Sheet for Healthcare Providers:  IncredibleEmployment.be  This test is no t yet approved or cleared by the [place] FDA and  has been authorized for detection and/or diagnosis of SARS-CoV-2 by FDA under an Emergency Use Authorization (EUA). This EUA will remain  in effect (meaning this test can be used) for the duration of the COVID-19 declaration under Section 564(b)(1) of the Act, 21 U.S.C.section 360bbb-3(b)(1), unless the authorization is terminated  or revoked sooner.       Influenza A by PCR NEGATIVE NEGATIVE Final   Influenza B by PCR NEGATIVE NEGATIVE Final    Comment: (NOTE) The Xpert Xpress SARS-CoV-2/FLU/RSV plus assay is intended as an aid in the diagnosis of influenza from Nasopharyngeal swab specimens and should not be used as a sole basis for treatment. Nasal washings and aspirates are unacceptable for Xpert Xpress SARS-CoV-2/FLU/RSV testing.  Fact Sheet for Patients: EntrepreneurPulse.com.au  Fact Sheet for Healthcare Providers: IncredibleEmployment.be  This test is not yet approved or cleared by the [place] FDA and has been authorized for detection and/or diagnosis of SARS-CoV-2 by FDA under an Emergency Use Authorization (EUA). This EUA will remain in effect (meaning this test can be used) for the duration of the COVID-19 declaration under Section 564(b)(1) of the Act, 21 U.S.C. section 360bbb-3(b)(1), unless the  authorization is terminated or revoked.  Performed at [facility], [address] [place], [place] [address]   Surgical PCR screen     Status: Abnormal   Collection Time: 01/08/21 11:05 PM   Specimen: Nasal Mucosa; Nasal Swab  Result Value Ref Range Status   MRSA, PCR NEGATIVE NEGATIVE Final   Staphylococcus aureus POSITIVE (A) NEGATIVE Final    Comment: (NOTE) The Xpert SA Assay (FDA approved for NASAL specimens in  patients 83 years of age and older), is one component of a comprehensive surveillance program. It is not intended to diagnose infection nor to guide or monitor treatment. Performed at [facility], [address] [place], [place] [address]      Labs: BNP (last 3 results) Recent Labs    01/05/21 1156  BNP 485.4*   Basic Metabolic Panel: Recent Labs  Lab 01/06/21 0437 01/07/21 0447 01/08/21 0430 01/09/21 0547 01/10/21 0455  NA 142 137 139 133* 139  K 3.5 4.0 4.7 4.6 5.0  CL 108 103 104 95* 102  CO2 25 26 28 30 29   GLUCOSE 56* 174* 132* 175* 87  BUN 37* 35* 39* 49* 41*  CREATININE 1.41* 1.24 1.38* 1.56* 1.38*  CALCIUM 8.9 8.7* 9.2 8.8* 9.2   Liver Function Tests: Recent Labs  Lab 01/06/21 0437  AST 29  ALT 28  ALKPHOS 32*  BILITOT 1.2  PROT 6.2*  ALBUMIN 3.4*   No results for input(s): LIPASE, AMYLASE in the last 168 hours. No results for input(s): AMMONIA in the last 168 hours. CBC: Recent Labs  Lab 01/05/21 1156 01/06/21 0437 01/07/21 0447  WBC 5.6 4.5 5.0  HGB 11.6* 11.5* 11.3*  HCT 36.3* 35.5* 35.2*  MCV 92.8 91.3 92.6  PLT 190 184 192   Cardiac Enzymes: No results for input(s): CKTOTAL, CKMB, CKMBINDEX, TROPONINI in the last 168 hours. BNP: Invalid input(s): POCBNP CBG: Recent Labs  Lab 01/09/21 0751 01/09/21 1142 01/09/21 1701 01/09/21 2049 01/10/21 0725  GLUCAP 161* 151* 164* 336* 71   D-Dimer No results for input(s): DDIMER in the last 72 hours. Hgb A1c No results for input(s): HGBA1C in  the last 72 hours. Lipid Profile No results for input(s): CHOL, HDL, LDLCALC, TRIG, CHOLHDL, LDLDIRECT in the last 72 hours. Thyroid function studies No results for input(s): TSH, T4TOTAL, T3FREE, THYROIDAB in the last 72 hours.  Invalid input(s): FREET3 Anemia work up No results for input(s): VITAMINB12, FOLATE, FERRITIN, TIBC, IRON, RETICCTPCT in the last 72 hours. Urinalysis    Component Value Date/Time   COLORURINE YELLOW 11/13/2020 0900   APPEARANCEUR Cloudy (A) 01/03/2021 1342   LABSPEC 1.010 11/13/2020 0900   PHURINE 6.0 11/13/2020 0900   GLUCOSEU Negative 01/03/2021 1342   HGBUR NEGATIVE 11/13/2020 0900   BILIRUBINUR Negative 01/03/2021 1342   KETONESUR NEGATIVE 11/13/2020 0900   PROTEINUR 1+ (A) 01/03/2021 1342   PROTEINUR 100 (A) 11/13/2020 0900   UROBILINOGEN 0.2 05/21/2014 0915   NITRITE Negative 01/03/2021 1342   NITRITE NEGATIVE 11/13/2020 0900   LEUKOCYTESUR Negative 01/03/2021 1342   LEUKOCYTESUR NEGATIVE 11/13/2020 0900   Sepsis Labs Invalid input(s): PROCALCITONIN,  WBC,  LACTICIDVEN Microbiology Recent Results (from the past 240 hour(s))  Microscopic Examination     Status: Abnormal   Collection Time: 01/03/21  1:42 PM   Urine  Result Value Ref Range Status   WBC, UA 0-5 0 - 5 /hpf Final   RBC 0-2 0 - 2 /hpf Final   Epithelial Cells (non renal) 0-10 0 - 10 /hpf Final   Renal Epithel, UA 0-10 (A) None seen /hpf Final   Bacteria, UA Moderate (A) None seen/Few Final  CULTURE, URINE COMPREHENSIVE     Status: None   Collection Time: 01/03/21  1:55 PM   Specimen: Urine   UR  Result Value Ref Range Status   Urine Culture, Comprehensive Final report  Final   Organism ID, Bacteria Comment  Final    Comment: No growth in 36 -  48 hours.  Resp Panel by RT-PCR (Flu A&B, Covid) Nasopharyngeal Swab     Status: None   Collection Time: 01/05/21 11:56 AM   Specimen: Nasopharyngeal Swab; Nasopharyngeal(NP) swabs in vial transport medium  Result Value Ref Range  Status   SARS Coronavirus 2 by RT PCR NEGATIVE NEGATIVE Final    Comment: (NOTE) SARS-CoV-2 target nucleic acids are NOT DETECTED.  The SARS-CoV-2 RNA is generally detectable in upper respiratory specimens during the acute phase of infection. The lowest concentration of SARS-CoV-2 viral copies this assay can detect is 138 copies/mL. A negative result does not preclude SARS-Cov-2 infection and should not be used as the sole basis for treatment or other patient management decisions. A negative result may occur with  improper specimen collection/handling, submission of specimen other than nasopharyngeal swab, presence of viral mutation(s) within the areas targeted by this assay, and inadequate number of viral copies(<138 copies/mL). A negative result must be combined with clinical observations, patient history, and epidemiological information. The expected result is Negative.  Fact Sheet for Patients:  EntrepreneurPulse.com.au  Fact Sheet for Healthcare Providers:  IncredibleEmployment.be  This test is no t yet approved or cleared by the [place] FDA and  has been authorized for detection and/or diagnosis of SARS-CoV-2 by FDA under an Emergency Use Authorization (EUA). This EUA will remain  in effect (meaning this test can be used) for the duration of the COVID-19 declaration under Section 564(b)(1) of the Act, 21 U.S.C.section 360bbb-3(b)(1), unless the authorization is terminated  or revoked sooner.       Influenza A by PCR NEGATIVE NEGATIVE Final   Influenza B by PCR NEGATIVE NEGATIVE Final    Comment: (NOTE) The Xpert Xpress SARS-CoV-2/FLU/RSV plus assay is intended as an aid in the diagnosis of influenza from Nasopharyngeal swab specimens and should not be used as a sole basis for treatment. Nasal washings and aspirates are unacceptable for Xpert Xpress SARS-CoV-2/FLU/RSV testing.  Fact Sheet for  Patients: EntrepreneurPulse.com.au  Fact Sheet for Healthcare Providers: IncredibleEmployment.be  This test is not yet approved or cleared by the [place] FDA and has been authorized for detection and/or diagnosis of SARS-CoV-2 by FDA under an Emergency Use Authorization (EUA). This EUA will remain in effect (meaning this test can be used) for the duration of the COVID-19 declaration under Section 564(b)(1) of the Act, 21 U.S.C. section 360bbb-3(b)(1), unless the authorization is terminated or revoked.  Performed at [facility], [address] [place], [place] [address]   Surgical PCR screen     Status: Abnormal   Collection Time: 01/08/21 11:05 PM   Specimen: Nasal Mucosa; Nasal Swab  Result Value Ref Range Status   MRSA, PCR NEGATIVE NEGATIVE Final   Staphylococcus aureus POSITIVE (A) NEGATIVE Final    Comment: (NOTE) The Xpert SA Assay (FDA approved for NASAL specimens in patients 51 years of age and older), is one component of a comprehensive surveillance program. It is not intended to diagnose infection nor to guide or monitor treatment. Performed at [facility], [address] [place], [place] [address]          30 Day Unplanned Readmission Risk Score    Flowsheet Row ED to Hosp-Admission (Current) from 01/05/2021 in [facility]  30 Day Unplanned Readmission Risk Score (%) 19.49 Filed at 01/10/2021 0801       This score is the patient's risk of an unplanned readmission within 30 days of being discharged (0 -100%). The score is based  on dignosis, age, lab data, medications, orders, and past utilization.   Low:  0-14.9   Medium: 15-21.9   High: 22-29.9   Extreme: 30 and above             SIGNED:   Edwin Dada, MD  [facility] 01/10/2021, 8:18 PM

## 2021-01-10 NOTE — Care Management Important Message (Signed)
Important Message  Patient Details  Name: Caleb Dougherty MRN: [id number] Date of Birth: [birth year]   Medicare Important Message Given:  No  Discharged prior to arrival to unit to deliver concurrent Medicare IM. Received initial on 01/06/21.    Dannette Barbara 01/10/2021, 10:44 AM

## 2021-01-10 NOTE — Progress Notes (Signed)
Inpatient Diabetes Program Recommendations  AACE/ADA: New Consensus Statement on Inpatient Glycemic Control   Target Ranges:  Prepandial:   less than 140 mg/dL      Peak postprandial:   less than 180 mg/dL (1-2 hours)      Critically ill patients:  140 - 180 mg/dL   Results for CORDAE, MCCAREY (MRN [id number]) as of 01/10/2021 09:18  Ref. Range 01/09/2021 07:51 01/09/2021 11:42 01/09/2021 17:01 01/09/2021 20:49 01/10/2021 07:25  Glucose-Capillary Latest Ref Range: 70 - 99 mg/dL 161 (H) 151 (H) 164 (H) 336 (H) 71   Review of Glycemic Control  Diabetes history: DM2 Outpatient Diabetes medications: Lantus 28 units daily Current orders for Inpatient glycemic control: Lantus 21 units daily, Novolog 0-15 units TID with meals, Novolog 0-5 units QHS  Inpatient Diabetes Program Recommendations:    Insulin: Fasting glucose 71 mg/dl today. Already given Lantus 21 units today. Please consider decreasing Lantus to 19 units daily to start 01/11/21.  Thanks, Barnie Alderman, RN, MSN, CDE Diabetes Coordinator Inpatient Diabetes Program [phone] (Team Pager from 8am to 5pm)

## 2021-01-23 DIAGNOSIS — Z950 Presence of cardiac pacemaker: Secondary | ICD-10-CM | POA: Insufficient documentation

## 2021-03-09 ENCOUNTER — Ambulatory Visit: Payer: Medicare Other | Admitting: Physical Medicine and Rehabilitation

## 2021-03-29 LAB — BETA ISOFORM (CREUTZFELDT-JAKOB DISEASE)

## 2021-06-09 ENCOUNTER — Encounter (HOSPITAL_COMMUNITY): Payer: Self-pay | Admitting: Radiology

## 2021-06-30 ENCOUNTER — Other Ambulatory Visit: Payer: Self-pay | Admitting: Family Medicine

## 2021-06-30 DIAGNOSIS — J181 Lobar pneumonia, unspecified organism: Secondary | ICD-10-CM

## 2021-07-11 ENCOUNTER — Other Ambulatory Visit: Payer: Self-pay | Admitting: Family Medicine

## 2021-07-11 DIAGNOSIS — R1032 Left lower quadrant pain: Secondary | ICD-10-CM

## 2021-07-17 ENCOUNTER — Ambulatory Visit
Admission: RE | Admit: 2021-07-17 | Discharge: 2021-07-17 | Disposition: A | Discharge status: Home / Self Care | Payer: Medicare Other | Source: Ambulatory Visit | Attending: Family Medicine | Admitting: Family Medicine

## 2021-07-17 ENCOUNTER — Ambulatory Visit: Payer: Medicare Other

## 2021-07-17 ENCOUNTER — Other Ambulatory Visit: Payer: Self-pay

## 2021-07-17 DIAGNOSIS — R1032 Left lower quadrant pain: Secondary | ICD-10-CM | POA: Insufficient documentation

## 2021-07-17 DIAGNOSIS — J181 Lobar pneumonia, unspecified organism: Secondary | ICD-10-CM | POA: Diagnosis present

## 2022-05-13 ENCOUNTER — Encounter: Payer: Self-pay | Admitting: Emergency Medicine

## 2022-05-13 ENCOUNTER — Emergency Department: Payer: No Typology Code available for payment source

## 2022-05-13 ENCOUNTER — Other Ambulatory Visit: Payer: Self-pay

## 2022-05-13 ENCOUNTER — Observation Stay
Admission: EM | Admit: 2022-05-13 | Discharge: 2022-05-14 | Disposition: A | Discharge status: Home / Self Care | Payer: No Typology Code available for payment source | Attending: Obstetrics and Gynecology | Admitting: Obstetrics and Gynecology

## 2022-05-13 DIAGNOSIS — I110 Hypertensive heart disease with heart failure: Secondary | ICD-10-CM | POA: Diagnosis not present

## 2022-05-13 DIAGNOSIS — Z96612 Presence of left artificial shoulder joint: Secondary | ICD-10-CM | POA: Diagnosis not present

## 2022-05-13 DIAGNOSIS — I4729 Other ventricular tachycardia: Secondary | ICD-10-CM | POA: Diagnosis not present

## 2022-05-13 DIAGNOSIS — Z8673 Personal history of transient ischemic attack (TIA), and cerebral infarction without residual deficits: Secondary | ICD-10-CM | POA: Insufficient documentation

## 2022-05-13 DIAGNOSIS — Z96653 Presence of artificial knee joint, bilateral: Secondary | ICD-10-CM | POA: Insufficient documentation

## 2022-05-13 DIAGNOSIS — Z7982 Long term (current) use of aspirin: Secondary | ICD-10-CM | POA: Diagnosis not present

## 2022-05-13 DIAGNOSIS — Z85820 Personal history of malignant melanoma of skin: Secondary | ICD-10-CM | POA: Diagnosis not present

## 2022-05-13 DIAGNOSIS — Z96659 Presence of unspecified artificial knee joint: Secondary | ICD-10-CM

## 2022-05-13 DIAGNOSIS — Z950 Presence of cardiac pacemaker: Secondary | ICD-10-CM | POA: Insufficient documentation

## 2022-05-13 DIAGNOSIS — R42 Dizziness and giddiness: Secondary | ICD-10-CM | POA: Diagnosis present

## 2022-05-13 DIAGNOSIS — R0789 Other chest pain: Secondary | ICD-10-CM | POA: Diagnosis not present

## 2022-05-13 DIAGNOSIS — Z79899 Other long term (current) drug therapy: Secondary | ICD-10-CM | POA: Insufficient documentation

## 2022-05-13 DIAGNOSIS — Z96651 Presence of right artificial knee joint: Secondary | ICD-10-CM

## 2022-05-13 DIAGNOSIS — Z794 Long term (current) use of insulin: Secondary | ICD-10-CM | POA: Insufficient documentation

## 2022-05-13 DIAGNOSIS — E119 Type 2 diabetes mellitus without complications: Secondary | ICD-10-CM | POA: Diagnosis not present

## 2022-05-13 DIAGNOSIS — I5030 Unspecified diastolic (congestive) heart failure: Secondary | ICD-10-CM | POA: Insufficient documentation

## 2022-05-13 DIAGNOSIS — I10 Essential (primary) hypertension: Secondary | ICD-10-CM | POA: Diagnosis present

## 2022-05-13 DIAGNOSIS — I472 Ventricular tachycardia, unspecified: Secondary | ICD-10-CM | POA: Diagnosis not present

## 2022-05-13 LAB — BASIC METABOLIC PANEL
Anion gap: 8 (ref 5–15)
BUN: 42 mg/dL — ABNORMAL HIGH (ref 8–23)
CO2: 22 mmol/L (ref 22–32)
Calcium: 9 mg/dL (ref 8.9–10.3)
Chloride: 107 mmol/L (ref 98–111)
Creatinine, Ser: 1.68 mg/dL — ABNORMAL HIGH (ref 0.61–1.24)
GFR, Estimated: 38 mL/min — ABNORMAL LOW (ref >60)
Glucose, Bld: 202 mg/dL — ABNORMAL HIGH (ref 70–99)
Potassium: 4.1 mmol/L (ref 3.5–5.1)
Sodium: 137 mmol/L (ref 135–145)

## 2022-05-13 LAB — CBC
HCT: 41.2 % (ref 39.0–52.0)
Hemoglobin: 13.4 g/dL (ref 13.0–17.0)
MCH: 30.1 pg (ref 26.0–34.0)
MCHC: 32.5 g/dL (ref 30.0–36.0)
MCV: 92.6 fL (ref 80.0–100.0)
Platelets: 199 10*3/uL (ref 150–400)
RBC: 4.45 MIL/uL (ref 4.22–5.81)
RDW: 12.7 % (ref 11.5–15.5)
WBC: 6.8 10*3/uL (ref 4.0–10.5)
nRBC: 0 % (ref 0.0–0.2)

## 2022-05-13 LAB — TROPONIN I (HIGH SENSITIVITY)
Troponin I (High Sensitivity): 23 ng/L — ABNORMAL HIGH (ref <18)
Troponin I (High Sensitivity): 28 ng/L — ABNORMAL HIGH (ref <18)

## 2022-05-13 LAB — CBG MONITORING, ED: Glucose-Capillary: 102 mg/dL — ABNORMAL HIGH (ref 70–99)

## 2022-05-13 LAB — BRAIN NATRIURETIC PEPTIDE: B Natriuretic Peptide: 378.1 pg/mL — ABNORMAL HIGH (ref 0.0–100.0)

## 2022-05-13 LAB — MAGNESIUM: Magnesium: 1.9 mg/dL (ref 1.7–2.4)

## 2022-05-13 MED ORDER — SODIUM CHLORIDE 0.9% FLUSH
3.0000 mL | Freq: Two times a day (BID) | INTRAVENOUS | Status: DC
  Administered 2022-05-13: 3 mL via INTRAVENOUS

## 2022-05-13 MED ORDER — ACETAMINOPHEN 325 MG RE SUPP: 650.0000 mg | Freq: Four times a day (QID) | RECTAL | Status: DC | PRN

## 2022-05-13 MED ORDER — HYDRALAZINE HCL 50 MG PO TABS
50.0000 mg | ORAL_TABLET | Freq: Three times a day (TID) | ORAL | Status: DC
  Administered 2022-05-13 – 2022-05-14 (×2): 50 mg via ORAL
  Filled 2022-05-13 (×2): qty 1

## 2022-05-13 MED ORDER — ASPIRIN 81 MG PO TBEC
81.0000 mg | DELAYED_RELEASE_TABLET | Freq: Every day | ORAL | Status: DC
  Administered 2022-05-14: 81 mg via ORAL
  Filled 2022-05-13: qty 1

## 2022-05-13 MED ORDER — ENOXAPARIN SODIUM 30 MG/0.3ML IJ SOSY
30.0000 mg | PREFILLED_SYRINGE | INTRAMUSCULAR | Status: DC
  Administered 2022-05-13: 30 mg via SUBCUTANEOUS
  Filled 2022-05-13: qty 0.3

## 2022-05-13 MED ORDER — ACETAMINOPHEN 325 MG PO TABS: 650.0000 mg | ORAL_TABLET | Freq: Four times a day (QID) | ORAL | Status: DC | PRN

## 2022-05-13 MED ORDER — AMLODIPINE BESYLATE 5 MG PO TABS: 10.0000 mg | ORAL_TABLET | Freq: Every day | ORAL | Status: DC

## 2022-05-13 MED ORDER — INSULIN ASPART 100 UNIT/ML IJ SOLN
0.0000 [IU] | Freq: Three times a day (TID) | INTRAMUSCULAR | Status: DC
  Administered 2022-05-14: 5 [IU] via SUBCUTANEOUS
  Filled 2022-05-13: qty 1

## 2022-05-13 MED ORDER — ENOXAPARIN SODIUM 40 MG/0.4ML IJ SOSY: 40.0000 mg | PREFILLED_SYRINGE | INTRAMUSCULAR | Status: DC

## 2022-05-13 MED ORDER — ADULT MULTIVITAMIN W/MINERALS CH
1.0000 | ORAL_TABLET | Freq: Every day | ORAL | Status: DC
  Administered 2022-05-13 – 2022-05-14 (×2): 1 via ORAL
  Filled 2022-05-13 (×2): qty 1

## 2022-05-13 MED ORDER — LOSARTAN POTASSIUM 50 MG PO TABS
100.0000 mg | ORAL_TABLET | Freq: Every day | ORAL | Status: DC
  Administered 2022-05-14: 100 mg via ORAL
  Filled 2022-05-13: qty 2

## 2022-05-13 MED ORDER — INSULIN GLARGINE-YFGN 100 UNIT/ML ~~LOC~~ SOLN
18.0000 [IU] | Freq: Every day | SUBCUTANEOUS | Status: DC
  Filled 2022-05-13: qty 0.18

## 2022-05-13 MED ORDER — INSULIN GLARGINE-YFGN 100 UNIT/ML ~~LOC~~ SOLN
18.0000 [IU] | Freq: Every day | SUBCUTANEOUS | Status: DC
  Administered 2022-05-14: 18 [IU] via SUBCUTANEOUS
  Filled 2022-05-13: qty 0.18

## 2022-05-13 MED ORDER — FUROSEMIDE 40 MG PO TABS
20.0000 mg | ORAL_TABLET | Freq: Every day | ORAL | Status: DC
  Administered 2022-05-14: 20 mg via ORAL
  Filled 2022-05-13: qty 1

## 2022-05-13 MED ORDER — TAMSULOSIN HCL 0.4 MG PO CAPS
0.8000 mg | ORAL_CAPSULE | Freq: Every day | ORAL | Status: DC
  Administered 2022-05-13: 0.8 mg via ORAL
  Filled 2022-05-13: qty 2

## 2022-05-13 NOTE — ED Notes (Signed)
Performed pacemaker interrogation for [employer] pacemaker.

## 2022-05-13 NOTE — Assessment & Plan Note (Signed)
Patient states he has been experiencing hypoglycemic episodes during the nighttime, so his PCP at the [place] has decreased his Lantus from 46 units to 36 units daily.  While admitted, will utilize SSI and 50% of Lantus dose.  - A1c pending - SSI, moderate - Semglee 18 units daily

## 2022-05-13 NOTE — Assessment & Plan Note (Signed)
Initially hypertensive on presentation, however blood pressure has improved.   - Will continue home antihypertensives.

## 2022-05-13 NOTE — Assessment & Plan Note (Signed)
Telemetry personally reviewed with evidence of frequent PVCs and 4-6 beat NSVT.  Patient has not markedly hypervolemic on examination, so I do not suspect acute heart failure to be the etiology.  Per chart review, patient does not have any history of coronary artery disease.  We will consult with cardiology regarding beta-blockade versus antiarrhythmics.  -Telemetry monitoring - Cardiology has been consulted; appreciate their recommendations

## 2022-05-13 NOTE — Progress Notes (Signed)
PHARMACIST - PHYSICIAN COMMUNICATION  CONCERNING:  Enoxaparin (Lovenox) for DVT Prophylaxis    RECOMMENDATION: Patient was prescribed enoxaprin '40mg'$  R37 hours for VTE prophylaxis.   Filed Weights   05/13/22 1213  Weight: 75.5 kg (166 lb 7.2 oz)    Body mass index is 25.31 kg/m.  Estimated Creatinine Clearance: 27.7 mL/min (A) (by C-G formula based on SCr of 1.68 mg/dL (H)).   Patient is candidate for enoxaparin '30mg'$  every 24 hours based on CrCl <28m/min or Weight <45kg  DESCRIPTION: [facility]  Patient is now receiving enoxaparin 30 mg every 24 hours    Shota Kohrs Rodriguez-Guzman PharmD, BCPS 05/13/2022 6:22 PM

## 2022-05-13 NOTE — ED Provider Triage Note (Signed)
  Emergency Medicine Provider Triage Evaluation Note  Caleb Dougherty , a 88 y.o.male,  was evaluated in triage.  Pt complains of chest pressure that started this morning.  Additionally reports some dizziness as well.  Family called EMS, they reportedly saw an abnormality on his EKG.  Recommend that he come here to be seen.  He states that he is currently asymptomatic at this time.   Review of Systems  Positive: Chest pain, dizziness Negative: Denies fever, chest pain, vomiting  Physical Exam  There were no vitals filed for this visit. Gen:   Awake, no distress   Resp:  Normal effort  MSK:   Moves extremities without difficulty  Other:    Medical Decision Making  Given the patient's initial medical screening exam, the following diagnostic evaluation has been ordered. The patient will be placed in the appropriate treatment space, once one is available, to complete the evaluation and treatment. I have discussed the plan of care with the patient and I have advised the patient that an ED physician or mid-level practitioner will reevaluate their condition after the test results have been received, as the results may give them additional insight into the type of treatment they may need.    Diagnostics: Labs, EKG, CXR  Treatments: none immediately   Teodoro Spray, [place] 05/13/22 1226
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
## 2022-05-13 NOTE — ED Provider Notes (Signed)
[facility] Provider Note    Event Date/Time   First MD Initiated Contact with Patient 05/13/22 1507     (approximate)   History   Chief Complaint Chest Pain and Dizziness   HPI  Caleb Dougherty is a 88 y.o. male with past medical history of hypertension, diabetes, and stroke who presents to the ED for chest pain.  Patient reports that he has been dealing with intermittent dull pain in his left arm since about 11:00 this morning.  Pain seems to come and go with no exacerbating or alleviating factors.  He states he will often feel dizzy and lightheaded with the pain, but has not felt like he is going to pass out.  He states he has been otherwise feeling well with no fevers, cough, or difficulty breathing.  He has not noticed any pain or swelling in his legs.  Patient had leadless pacemaker placed earlier this year with Dr. Saralyn Pilar of cardiology.     Physical Exam   Triage Vital Signs: ED Triage Vitals  Enc Vitals Group     BP 05/13/22 1225 (!) 170/92     Pulse Rate 05/13/22 1225 77     Resp 05/13/22 1225 18     Temp 05/13/22 1225 (!) 97.5 F (36.4 C)     Temp Source 05/13/22 1225 Oral     SpO2 05/13/22 1225 97 %     Weight 05/13/22 1213 166 lb 7.2 oz (75.5 kg)     Height 05/13/22 1213 '5\' 8"'$  (1.727 m)     Head Circumference --      Peak Flow --      Pain Score 05/13/22 1212 0     Pain Loc --      Pain Edu? --      Excl. in [place]? --     Most recent vital signs: Vitals:   05/13/22 1510 05/13/22 1530  BP: (!) 143/78 134/81  Pulse: 77 82  Resp: 18 20  Temp:    SpO2: 97% 96%    Constitutional: Alert and oriented. Eyes: Conjunctivae are normal. Head: Atraumatic. Nose: No congestion/rhinnorhea. Mouth/Throat: Mucous membranes are moist.  Cardiovascular: Normal rate, regular rhythm. Grossly normal heart sounds.  2+ radial pulses bilaterally. Respiratory: Normal respiratory effort.  No retractions. Lungs CTAB. Gastrointestinal: Soft and  nontender. No distention. Musculoskeletal: No lower extremity tenderness nor edema.  Neurologic:  Normal speech and language. No gross focal neurologic deficits are appreciated.    ED Results / Procedures / Treatments   Labs (all labs ordered are listed, but only abnormal results are displayed) Labs Reviewed  BASIC METABOLIC PANEL - Abnormal; Notable for the following components:      Result Value   Glucose, Bld 202 (*)    BUN 42 (*)    Creatinine, Ser 1.68 (*)    GFR, Estimated 38 (*)    All other components within normal limits  BRAIN NATRIURETIC PEPTIDE - Abnormal; Notable for the following components:   B Natriuretic Peptide 378.1 (*)    All other components within normal limits  TROPONIN I (HIGH SENSITIVITY) - Abnormal; Notable for the following components:   Troponin I (High Sensitivity) 23 (*)    All other components within normal limits  TROPONIN I (HIGH SENSITIVITY) - Abnormal; Notable for the following components:   Troponin I (High Sensitivity) 28 (*)    All other components within normal limits  CBC  MAGNESIUM     EKG  ED ECG REPORT I,  Blake Divine, the attending physician, personally viewed and interpreted this ECG.   Date: 05/13/2022  EKG Time: 12:23  Rate: 84  Rhythm: Atrial sensed and ventricular paced rhythm  Axis: Normal  Intervals:nonspecific intraventricular conduction delay  ST&T Change: None  RADIOLOGY Chest x-ray reviewed and interpreted by me with no infiltrate, edema, or effusion.  PROCEDURES:  Critical Care performed: No  .1-3 Lead EKG Interpretation  Performed by: Blake Divine, MD Authorized by: Blake Divine, MD     Interpretation: abnormal     ECG rate:  70-90   ECG rate assessment: normal     Rhythm: paced     Rhythm comment:  Occasional 4-5 beat runs of VT   Ectopy: PVCs     Conduction: normal      MEDICATIONS ORDERED IN ED: Medications - No data to display   IMPRESSION / MDM / [place] / ED COURSE   I reviewed the triage vital signs and the nursing notes.                              88 y.o. male with past medical history of hypertension, diabetes, and stroke who presents to the ED for intermittent chest pain and dizziness since about 11:00 this morning.  Patient's presentation is most consistent with acute presentation with potential threat to life or bodily function.  Differential diagnosis includes, but is not limited to, ACS, arrhythmia, PE, pneumonia, pneumothorax, dissection, electrolyte abnormality, AKI.  Patient nontoxic-appearing and in no acute distress, vital signs are unremarkable.  EKG shows appropriately paced rhythm, although observing patient on cardiac monitor he has multiple nonsustained runs of ventricular tachycardia lasting about 5-6 beats.  These episodes may be contributing to his symptoms, although he is currently asymptomatic here in the ED.  Labs show stable chronic kidney disease with no acute electrolyte abnormality, will add on magnesium level.  Troponin mildly elevated but stable on recheck, no significant anemia or leukocytosis noted and no symptoms to suggest infectious process.  Chest x-ray is unremarkable, patient would benefit from admission given his concern for arrhythmia contributing to his symptoms.  Case discussed with hospitalist for admission.      FINAL CLINICAL IMPRESSION(S) / ED DIAGNOSES   Final diagnoses:  Atypical chest pain  Nonsustained monomorphic ventricular tachycardia ([place])     Rx / DC Orders   ED Discharge Orders     None        Note:  This document was prepared using Dragon voice recognition software and may include unintentional dictation errors.   Blake Divine, MD 05/13/22 [phone]

## 2022-05-13 NOTE — ED Triage Notes (Signed)
Pt wife reports pt with chest pressure this am and some dizziness. Family called EMS and they reported his EKG looked abnormal so to come and be seen.

## 2022-05-13 NOTE — Assessment & Plan Note (Signed)
Patient presenting with 1 day history of atypical chest pain that he describes as left arm pain, but on examination he points to the left upper chest.  While experiencing this chest pain, he was also experiencing dizziness.  Given telemetry evidence of frequent PVCs and 4-6 beat NSVT, I am concerned patient is experiencing symptomatic NSVT.  EKG is reassuring and troponin trend is flat.  I suspect patient would be a better candidate for beta-blockade rather antiarrhythmics due to age, but will reach out to cardiology for recommendations.  - Admit for telemetry monitoring - Cardiology has been consulted; appreciate their recommendations - Stat EKG for recurrence of symptoms

## 2022-05-13 NOTE — ED Notes (Signed)
Provided water and applesauce, crackers and PB.

## 2022-05-13 NOTE — H&P (Signed)
History and Physical    Patient: Caleb Dougherty PXT:[id number] DOB: 24-Aug-1928 DOA: 05/13/2022 DOS: the patient was seen and examined on 05/13/2022 PCP: [name], MD  Patient coming from: Home  Chief Complaint:  Chief Complaint  Patient presents with   Chest Pain   Dizziness   HPI: Caleb Dougherty is a 88 y.o. male with medical history significant of HFpEF, type 2 diabetes, hypertension, AV block s/p pacemaker, BPH, who presents to the ED with complaints of chest pain and dizziness.  Mr. Diltz states he was in his usual state of health until this morning.  He was able to get up and make himself breakfast.  When he sat down he began to experience left-sided chest pain located near his left arm in addition to left hand pain.  He denies any substernal chest pain.  At the same time, he experienced dizziness but denies any loss of consciousness.  Symptoms last approximately several minutes and resolve but have been recurring throughout the day.    He denies any shortness of breath, palpitations.  He notes his lower extremity edema appears unchanged in nature.  He denies any recent illnesses or changes in medication, other than decrease in his home Lantus.  ED course: On arrival to the ED, patient was hypertensive at 170/92 with heart rate of 77.  Initial blood work remarkable for glucose of 202, BUN of 42, creatinine of 1.68.  BNP elevated at 378.  Troponin elevated at 23 with flat trend to 28.  Chest x-ray obtained that shows small bilateral effusions and scattered pleural calcifications suggesting asbestos exposure.  No acute cardiopulmonary abnormalities.  EKG notable for atrial sensed, ventricular paced rhythm with occasional PVCs.  Telemetry reviewed by ED provider demonstrated frequent NSVT.  Due to this, [facility] consulted for admission.  Review of Systems: As mentioned in the history of present illness. All other systems reviewed and are negative.  Past Medical History:  Diagnosis  Date   Arthritis    osteoarthritis-knees and feet   BPH (benign prostatic hypertrophy)    Cancer (HCC)    melanoma(back)-no futher issues. Skin cancers face "frozen"   Cataract    02-12-14 left- ready for surgery, but only has had right done   Diabetes mellitus without complication ([place])    diabetes x4 yrs-oral med and Lantus used   HOH (hard of hearing)    wears bilateral hearing aids   Hypertension    Impaired hearing    hard of hearing -no hearing aids   Urgency of urination    Wears dentures    full lower   Wears glasses    reading   Past Surgical History:  Procedure Laterality Date   CARPAL TUNNEL RELEASE Right 06/23/2013   Procedure: RIGHT CARPAL TUNNEL RELEASE;  Surgeon: Cammie Sickle., MD;  Location: [place];  Service: Orthopedics;  Laterality: Right;   CATARACT EXTRACTION Right    CATARACT EXTRACTION W/PHACO Left 03/30/2019   Procedure: CATARACT EXTRACTION PHACO AND INTRAOCULAR LENS PLACEMENT (IOC) LEFT DIABETES  02:21.8  19.2%  28.18;  Surgeon: Eulogio Bear, MD;  Location: [place];  Service: Ophthalmology;  Laterality: Left;  diabetic - insulin   COLONOSCOPY     INCISION AND DRAINAGE WOUND WITH FOREIGN BODY REMOVAL Left 12/07/2014   Procedure: INCISION AND DRAINAGE WOUND WITH FOREIGN BODY REMOVAL;  Surgeon: Corky Mull, MD;  Location: [facility];  Service: Orthopedics;  Laterality: Left;   PACEMAKER LEADLESS INSERTION N/A 01/09/2021  Procedure: PACEMAKER LEADLESS INSERTION;  Surgeon: Isaias Cowman, MD;  Location: [place] [facility];  Service: Cardiovascular;  Laterality: N/A;   REVERSE SHOULDER ARTHROPLASTY Left 11/19/2014   Procedure: LEFT REVERSE SHOULDER ARTHROPLASTY;  Surgeon: Netta Cedars, MD;  Location: [place];  Service: Orthopedics;  Laterality: Left;   TOTAL KNEE ARTHROPLASTY Left 02/23/2014   Procedure: LEFT TOTAL KNEE REPLACEMENT;  Surgeon: Mauri Pole, MD;  Location: [facility];  Service: Orthopedics;  Laterality:  Left;   TOTAL KNEE ARTHROPLASTY Right 05/25/2014   Procedure: RIGHT TOTAL KNEE ARTHROPLASTY;  Surgeon: Mauri Pole, MD;  Location: [facility];  Service: Orthopedics;  Laterality: Right;   Social History:  reports that he has never smoked. He has never used smokeless tobacco. He reports that he does not drink alcohol and does not use drugs.  Allergies  Allergen Reactions   Gabapentin    Indomethacin     Other reaction(s): Edema of lower extremity   Lisinopril Cough   Nortriptyline     Other reaction(s): Edema of lower extremity   Empagliflozin     Other reaction(s): Urinary incontinence    Family History  Problem Relation Age of Onset   Cancer Father    Cancer Sister    Other Sister        question of ALS    Prior to Admission medications   Medication Sig Start Date End Date Taking? Authorizing Provider  amLODipine (NORVASC) 10 MG tablet Take 10 mg by mouth daily in the afternoon.    [provider]  aspirin EC 81 MG tablet Take 81 mg by mouth daily.    [provider]  cyanocobalamin 1000 MCG tablet Take 1 tablet (1,000 mcg total) by mouth daily. 11/18/20   Love, Ivan Anchors, PA-C  furosemide (LASIX) 20 MG tablet Take 20 mg by mouth 2 (two) times daily.    [provider]  hydrALAZINE (APRESOLINE) 50 MG tablet Take 50 mg by mouth 3 (three) times daily. 12/13/20   [provider]  insulin glargine (LANTUS) 100 UNIT/ML Solostar Pen Inject 28 Units into the skin daily. 11/18/20   Love, Ivan Anchors, PA-C  losartan (COZAAR) 50 MG tablet Take 100 mg by mouth daily.    [provider]  Multiple Vitamins-Minerals (MULTIVITAMIN WITH MINERALS) tablet Take 1 tablet by mouth daily.    [provider]  tamsulosin (FLOMAX) 0.4 MG CAPS capsule Take 2 capsules (0.8 mg total) by mouth at bedtime. 11/18/20   Bary Leriche, PA-C    Physical Exam: Vitals:   05/13/22 1510 05/13/22 1530 05/13/22 1727 05/13/22 1900  BP: (!) 143/78 134/81  129/78  Pulse:  77 82  90  Resp: '18 20  16  '$ Temp:   97.8 F (36.6 C)   TempSrc:   Oral   SpO2: 97% 96%  94%  Weight:      Height:       Physical Exam Vitals and nursing note reviewed.  Constitutional:      General: He is not in acute distress.    Appearance: He is normal weight. He is not toxic-appearing.  HENT:     Head: Normocephalic and atraumatic.  Eyes:     Pupils: Pupils are equal, round, and reactive to light.  Cardiovascular:     Rate and Rhythm: Normal rate. Rhythm irregular.     Heart sounds: No murmur heard. Pulmonary:     Effort: Pulmonary effort is normal. No tachypnea, accessory muscle usage or respiratory distress.  Breath sounds: No stridor. Examination of the right-lower field reveals rales. Examination of the left-lower field reveals rales. Rales present. No decreased breath sounds, wheezing or rhonchi.  Abdominal:     General: Bowel sounds are normal.     Palpations: Abdomen is soft.     Tenderness: There is no abdominal tenderness.  Musculoskeletal:     Cervical back: Normal range of motion and neck supple.     Right lower leg: 1+ Pitting Edema present.     Left lower leg: 1+ Pitting Edema present.  Skin:    General: Skin is warm and dry.  Neurological:     General: No focal deficit present.     Mental Status: He is alert and oriented to person, place, and time.  Psychiatric:        Mood and Affect: Mood normal.        Behavior: Behavior normal.    Data Reviewed: CBC with no abnormalities noted.  BMP notable for glucose of 202, BUN of 42, creatinine of 1.68 GFR 38.  Magnesium normal at 1.9.  BNP elevated at 378.  Troponin initially elevated at 23 with flat trend to 28.    Chest x-ray with bilateral small pleural effusions and evidence of scarring and pleural calcifications.  EKG personally reviewed.  Atrial sensed, ventricular paced rhythm.  There are no new results to review at this time.  Assessment and Plan: * Atypical chest pain Patient presenting with  1 day history of atypical chest pain that he describes as left arm pain, but on examination he points to the left upper chest.  While experiencing this chest pain, he was also experiencing dizziness.  Given telemetry evidence of frequent PVCs and 4-6 beat NSVT, I am concerned patient is experiencing symptomatic NSVT.  EKG is reassuring and troponin trend is flat.  I suspect patient would be a better candidate for beta-blockade rather antiarrhythmics due to age, but will reach out to cardiology for recommendations.  - Admit for telemetry monitoring - Cardiology has been consulted; appreciate their recommendations - Stat EKG for recurrence of symptoms  NSVT (nonsustained ventricular tachycardia) ([place]) Telemetry personally reviewed with evidence of frequent PVCs and 4-6 beat NSVT.  Patient has not markedly hypervolemic on examination, so I do not suspect acute heart failure to be the etiology.  Per chart review, patient does not have any history of coronary artery disease.  We will consult with cardiology regarding beta-blockade versus antiarrhythmics.  -Telemetry monitoring - Cardiology has been consulted; appreciate their recommendations  HTN (hypertension) Initially hypertensive on presentation, however blood pressure has improved.   - Will continue home antihypertensives.  Type 2 diabetes mellitus ([place]) Patient states he has been experiencing hypoglycemic episodes during the nighttime, so [facility] at [facility] has decreased his Lantus from 46 units to 36 units daily.  While admitted, will utilize SSI and 50% of Lantus dose.  - A1c pending - SSI, moderate - Semglee 18 units daily   Advance Care Planning:   Code Status: DNR   Consults: Cardiology  Family Communication: Patient's son, [name], updated at bedside  Severity of Illness: The appropriate patient status for this patient is OBSERVATION. Observation status is judged to be reasonable and necessary in order to provide the required  intensity of service to ensure the patient's safety. The patient's presenting symptoms, physical exam findings, and initial radiographic and laboratory data in the context of their medical condition is felt to place them at decreased risk for further clinical deterioration.  Furthermore, it is anticipated that the patient will be medically stable for discharge from the hospital within 2 midnights of admission.   Author: Jose Persia, MD 05/13/2022 7:52 PM  For on call review [email or web address].

## 2022-05-14 DIAGNOSIS — R0789 Other chest pain: Secondary | ICD-10-CM

## 2022-05-14 LAB — BASIC METABOLIC PANEL
Anion gap: 7 (ref 5–15)
BUN: 39 mg/dL — ABNORMAL HIGH (ref 8–23)
CO2: 24 mmol/L (ref 22–32)
Calcium: 9.1 mg/dL (ref 8.9–10.3)
Chloride: 111 mmol/L (ref 98–111)
Creatinine, Ser: 1.4 mg/dL — ABNORMAL HIGH (ref 0.61–1.24)
GFR, Estimated: 47 mL/min — ABNORMAL LOW (ref >60)
Glucose, Bld: 62 mg/dL — ABNORMAL LOW (ref 70–99)
Potassium: 3.8 mmol/L (ref 3.5–5.1)
Sodium: 142 mmol/L (ref 135–145)

## 2022-05-14 LAB — HEMOGLOBIN A1C
Hgb A1c MFr Bld: 7.7 % — ABNORMAL HIGH (ref 4.8–5.6)
Mean Plasma Glucose: 174.29 mg/dL

## 2022-05-14 LAB — MAGNESIUM: Magnesium: 1.9 mg/dL (ref 1.7–2.4)

## 2022-05-14 LAB — CBG MONITORING, ED: Glucose-Capillary: 242 mg/dL — ABNORMAL HIGH (ref 70–99)

## 2022-05-14 MED ORDER — INSULIN GLARGINE-YFGN 100 UNIT/ML ~~LOC~~ SOPN: 30.0000 [IU] | PEN_INJECTOR | Freq: Every day | SUBCUTANEOUS | Status: AC

## 2022-05-14 NOTE — ED Notes (Signed)
Dr. Si Raider at bedside

## 2022-05-14 NOTE — ED Notes (Signed)
Pt drank 8 oz of orange juice upon request due to pt blood glucose being 62 per bmp.

## 2022-05-14 NOTE — ED Notes (Signed)
Waiting on morning insulin from pharmacy

## 2022-05-14 NOTE — Discharge Summary (Signed)
Caleb Dougherty IRJ:[id number] DOB: 12-26-1928 DOA: 05/13/2022  PCP: Caleb Pink, MD  Admit date: 05/13/2022 Discharge date: 05/14/2022  Time spent: 35 minutes  Recommendations for Outpatient Follow-up:  Cardiology f/u 1-2 weeks     Discharge Diagnoses:  Principal Problem:   Atypical chest pain Active Problems:   Nonsustained monomorphic ventricular tachycardia (HCC)   HTN (hypertension)   Type 2 diabetes mellitus ([place])   S/P left TKA   S/P right TKA   Discharge Condition: stable  Diet recommendation: heart healthy  Filed Weights   05/13/22 1213  Weight: 75.5 kg    History of present illness:  From admission h and p Caleb Dougherty is a 88 y.o. male with medical history significant of HFpEF, type 2 diabetes, hypertension, AV block s/p pacemaker, BPH, who presents to the ED with complaints of chest pain and dizziness.   Caleb Dougherty states he was in his usual state of health until this morning.  He was able to get up and make himself breakfast.  When he sat down he began to experience left-sided chest pain located near his left arm in addition to left hand pain.  He denies any substernal chest pain.  At the same time, he experienced dizziness but denies any loss of consciousness.  Symptoms last approximately several minutes and resolve but have been recurring throughout the day.     He denies any shortness of breath, palpitations.  He notes his lower extremity edema appears unchanged in nature.  He denies any recent illnesses or changes in medication, other than decrease in his home Lantus.  Hospital Course:  Patient presented with one day intermittent sharp brief pain left chest, non-exertional, with associated left hand pain. Mild stable troponin elevation, no ischemic changes on EKG, pacemaker interrogated and functioning appropriately. Evaluated by cardiology, this thought to be atypical/non-cardiac chest pain. Patient feeling well, no respiratory symptoms and breathing  comfortably on room air, chest pain has not recurred, think safe for discharge, will f/u with cardiology in 1-2 weeks. Of note patient reports frequent symptomatic hypoglycemia early in the morning. He has decreased his glargine from 46 to 30 units and I encouraged him to continue to reduce to maintain morning fastings above 80.  Procedures: none   Consultations: cardiology  Discharge Exam: Vitals:   05/14/22 0630 05/14/22 0900  BP: (!) 167/81 (!) 140/76  Pulse: 78 80  Resp: 16 14  Temp:    SpO2: 95% 100%    General: NAD Cardiovascular: RRR Respiratory: rales at bases, otherwise clear Ext: warm  Discharge Instructions   Discharge Instructions     Diet - low sodium heart healthy   Complete by: As directed    Increase activity slowly   Complete by: As directed       Allergies as of 05/14/2022       Reactions   Gabapentin    Indomethacin    Other reaction(s): Edema of lower extremity   Lisinopril Cough   Nortriptyline    Other reaction(s): Edema of lower extremity   Empagliflozin    Other reaction(s): Urinary incontinence        Medication List     TAKE these medications    amLODipine 10 MG tablet Commonly known as: NORVASC Take 10 mg by mouth daily in the afternoon.   aspirin EC 81 MG tablet Take 81 mg by mouth daily.   cyanocobalamin 1000 MCG tablet Commonly known as: VITAMIN B12 Take 1,000 mcg by mouth once a week.  furosemide 20 MG tablet Commonly known as: LASIX Take 20 mg by mouth daily.   hydrALAZINE 50 MG tablet Commonly known as: APRESOLINE Take 50 mg by mouth 3 (three) times daily.   insulin glargine-yfgn 100 UNIT/ML Pen Commonly known as: SEMGLEE Inject 30 Units into the skin daily. What changed: how much to take   losartan 100 MG tablet Commonly known as: COZAAR Take 100 mg by mouth daily.   multivitamin with minerals tablet Take 1 tablet by mouth daily.   tamsulosin 0.4 MG Caps capsule Commonly known as: FLOMAX Take 2  capsules (0.8 mg total) by mouth at bedtime.       Allergies  Allergen Reactions   Gabapentin    Indomethacin     Other reaction(s): Edema of lower extremity   Lisinopril Cough   Nortriptyline     Other reaction(s): Edema of lower extremity   Empagliflozin     Other reaction(s): Urinary incontinence    Follow-up Information     Paraschos, Alexander, MD. Go in 1 week(s).   Specialty: Cardiology Contact information: [facility] [place] [address] [phone]                  The results of significant diagnostics from this hospitalization (including imaging, microbiology, ancillary and laboratory) are listed below for reference.    Significant Diagnostic Studies: DG Chest 2 View  Result Date: 05/13/2022 CLINICAL DATA:  Chest pain, dizziness EXAM: CHEST - 2 VIEW COMPARISON:  Previous studies including chest radiographs done on 01/05/2021 FINDINGS: Transverse diameter heart is slightly increased. There is cardiac monitoring device in the anteromedial left lower thorax. There are linear densities in the lower lung fields, more so on the right side. There are scattered pleural calcifications. There is minimal blunting of lateral CP angles. There is no pneumothorax. There is previous left shoulder arthroplasty. IMPRESSION: Increased interstitial markings in the lower lung fields suggest scarring. There are no signs of alveolar pulmonary edema or new focal pulmonary consolidation. Blunting of posterior CP angles suggests small bilateral effusions. There are scattered pleural calcifications suggesting possible previous asbestos exposure. Electronically Signed   By: Elmer Picker M.D.   On: 05/13/2022 12:47    Microbiology: No results found for this or any previous visit (from the past 240 hour(s)).   Labs: Basic Metabolic Panel: Recent Labs  Lab 05/13/22 1230 05/13/22 1231 05/14/22 0413  NA  --  137 142  K  --  4.1 3.8   CL  --  107 111  CO2  --  22 24  GLUCOSE  --  202* 62*  BUN  --  42* 39*  CREATININE  --  1.68* 1.40*  CALCIUM  --  9.0 9.1  MG 1.9  --  1.9   Liver Function Tests: No results for input(s): "AST", "ALT", "ALKPHOS", "BILITOT", "PROT", "ALBUMIN" in the last 168 hours. No results for input(s): "LIPASE", "AMYLASE" in the last 168 hours. No results for input(s): "AMMONIA" in the last 168 hours. CBC: Recent Labs  Lab 05/13/22 1231  WBC 6.8  HGB 13.4  HCT 41.2  MCV 92.6  PLT 199   Cardiac Enzymes: No results for input(s): "CKTOTAL", "CKMB", "CKMBINDEX", "TROPONINI" in the last 168 hours. BNP: BNP (last 3 results) Recent Labs    05/13/22 1231  BNP 378.1*    ProBNP (last 3 results) No results for input(s): "PROBNP" in the last 8760 hours.  CBG: Recent Labs  Lab 05/13/22 2210 05/14/22 0254  GLUCAP 102* 242*       Signed:  Desma Maxim MD.  [facility] 05/14/2022, 9:31 AM

## 2022-05-14 NOTE — Consult Note (Signed)
[place] NOTE       Patient ID: Caleb Dougherty MRN: [id number] DOB/AGE: 01-May-1929 88 y.o.  Admit date: 05/13/2022 Referring Physician Dr. Charleen Kirks Primary Physician [facility] Primary Cardiologist Dr. Saralyn Pilar Reason for Consultation atypical chest pain  HPI: Caleb Dougherty is a 88yoM with a PMH of Mobitz type II AV block s/p Micra leadless pacemaker implantation 01/09/2021, HFpEF (EF 60-65%, G1 DD, mildly elevated PASP 10/2020), history of CVA, hyperlipidemia, type 2 diabetes, hypertension who presented to [facility] ED 05/13/2022 with intermittent left arm/left upper chest pain and dizziness.  Cardiology is consulted for further assistance.  The patient presents with his son who contributes to the history.  His primary concern is the pain in his left upper chest, just medial to his shoulder that he can localize to one point.  He says this area is intermittently hurt "for some time now" sometimes worse with exertion (woodworking) and sometimes just occurring at rest.  He has a hard time describing the character of the pain but sometimes it hurts when you press on a specific point.  Occasionally he will have pain in both of his hands.  He says he had some nausea, diaphoresis, and heart racing the other night but when he checked his blood sugars they were in the 50s, and he felt better as he got something to eat.  He was trying to contact his regular endocrinologist at the [facility] to make changes to his insulin but was unable to get a hold of them, so he changed his nightly units from 40 to 36 units and has felt better.  He also has occasional dizziness that occurs transiently for a few seconds once he stands up and goes away as he continues to walk and move around.  This has also been going on for some time now and are not particularly bothersome to the patient today.  He otherwise denies any shortness of breath, worsening of his chronic peripheral edema, orthopnea.  At my time of  evaluation the patient feels well and is eager to go home.  Vitals are notable for blood pressure of 140/76, heart rate 80 in sinus rhythm on telemetry, he is comfortable on room air.  Labs are notable for a potassium of 3.8, magnesium of 1.9, BUN/creatinine 39/1.40 and GFR 47, BNP minimally elevated at 378, high-sensitivity troponin minimally elevated with a flat trend at 23-28.  Chest x-ray with increased interstitial markings in the lower lung field suggestive of scarring without signs of alveolar pulmonary edema or pulmonary consolidation.  Scattered pleural calcifications showing possible previous asbestos exposure.  Review of systems complete and found to be negative unless listed above     Past Medical History:  Diagnosis Date   Arthritis    osteoarthritis-knees and feet   BPH (benign prostatic hypertrophy)    Cancer ([place])    melanoma(back)-no futher issues. Skin cancers face "frozen"   Cataract    02-12-14 left- ready for surgery, but only has had right done   Diabetes mellitus without complication ([place])    diabetes x4 yrs-oral med and Lantus used   HOH (hard of hearing)    wears bilateral hearing aids   Hypertension    Impaired hearing    hard of hearing -no hearing aids   Urgency of urination    Wears dentures    full lower   Wears glasses    reading    Past Surgical History:  Procedure Laterality Date   CARPAL TUNNEL RELEASE Right  06/23/2013   Procedure: RIGHT CARPAL TUNNEL RELEASE;  Surgeon: Cammie Sickle., MD;  Location: [place];  Service: Orthopedics;  Laterality: Right;   CATARACT EXTRACTION Right    CATARACT EXTRACTION W/PHACO Left 03/30/2019   Procedure: CATARACT EXTRACTION PHACO AND INTRAOCULAR LENS PLACEMENT (IOC) LEFT DIABETES  02:21.8  19.2%  28.18;  Surgeon: Eulogio Bear, MD;  Location: [place];  Service: Ophthalmology;  Laterality: Left;  diabetic - insulin   COLONOSCOPY     INCISION AND DRAINAGE WOUND WITH FOREIGN  BODY REMOVAL Left 12/07/2014   Procedure: INCISION AND DRAINAGE WOUND WITH FOREIGN BODY REMOVAL;  Surgeon: Corky Mull, MD;  Location: [facility];  Service: Orthopedics;  Laterality: Left;   PACEMAKER LEADLESS INSERTION N/A 01/09/2021   Procedure: PACEMAKER LEADLESS INSERTION;  Surgeon: Isaias Cowman, MD;  Location: [place] [facility];  Service: Cardiovascular;  Laterality: N/A;   REVERSE SHOULDER ARTHROPLASTY Left 11/19/2014   Procedure: LEFT REVERSE SHOULDER ARTHROPLASTY;  Surgeon: Netta Cedars, MD;  Location: [place];  Service: Orthopedics;  Laterality: Left;   TOTAL KNEE ARTHROPLASTY Left 02/23/2014   Procedure: LEFT TOTAL KNEE REPLACEMENT;  Surgeon: Mauri Pole, MD;  Location: [facility];  Service: Orthopedics;  Laterality: Left;   TOTAL KNEE ARTHROPLASTY Right 05/25/2014   Procedure: RIGHT TOTAL KNEE ARTHROPLASTY;  Surgeon: Mauri Pole, MD;  Location: [facility];  Service: Orthopedics;  Laterality: Right;    (Not in a hospital admission)  Social History   Socioeconomic History   Marital status: Married    Spouse name: Not on file   Number of children: Not on file   Years of education: Not on file   Highest education level: Not on file  Occupational History   Not on file  Tobacco Use   Smoking status: Never   Smokeless tobacco: Never  Vaping Use   Vaping Use: Never used  Substance and Sexual Activity   Alcohol use: No   Drug use: No   Sexual activity: Yes  Other Topics Concern   Not on file  Social History Narrative   Not on file   Social Determinants of Health   Financial Resource Strain: Not on file  Food Insecurity: Not on file  Transportation Needs: Not on file  Physical Activity: Not on file  Stress: Not on file  Social Connections: Not on file  Intimate Partner Violence: Not on file    Family History  Problem Relation Age of Onset   Cancer Father    Cancer Sister    Other Sister        question of ALS     Vitals:   05/14/22 0500 05/14/22 0530  05/14/22 0600 05/14/22 0630  BP: (!) 138/109 (!) 141/61 (!) 130/58 (!) 167/81  Pulse: 70 79 74 78  Resp:  '16 16 16  '$ Temp:      TempSrc:      SpO2: 90% 95% 93% 95%  Weight:      Height:        PHYSICAL EXAM General: Well-appearing elderly Caucasian male, well nourished, in no acute distress.  Sitting upright with legs off ED stretcher with son at bedside. HEENT:  Normocephalic and atraumatic. Neck:  No JVD.  Lungs: Normal respiratory effort on room air.  Decreased breath sounds without appreciable crackles or wheezes.   Heart: HRRR . Normal S1 and S2 without gallops or murmurs.  Abdomen: Non-distended appearing.  Msk: Normal strength and tone for age. Extremities: Warm and well  perfused. No clubbing, cyanosis.  1+ bilateral lower extremity edema with indentions around his ankle sock, around baseline per patient..  Neuro: Alert and oriented X 3. Psych:  Answers questions appropriately.   Labs: Basic Metabolic Panel: Recent Labs    05/13/22 1230 05/13/22 1231 05/14/22 0413  NA  --  137 142  K  --  4.1 3.8  CL  --  107 111  CO2  --  22 24  GLUCOSE  --  202* 62*  BUN  --  42* 39*  CREATININE  --  1.68* 1.40*  CALCIUM  --  9.0 9.1  MG 1.9  --  1.9   Liver Function Tests: No results for input(s): "AST", "ALT", "ALKPHOS", "BILITOT", "PROT", "ALBUMIN" in the last 72 hours. No results for input(s): "LIPASE", "AMYLASE" in the last 72 hours. CBC: Recent Labs    05/13/22 1231  WBC 6.8  HGB 13.4  HCT 41.2  MCV 92.6  PLT 199   Cardiac Enzymes: Recent Labs    05/13/22 1231 05/13/22 1511  TROPONINIHS 23* 28*   BNP: Recent Labs    05/13/22 1231  BNP 378.1*   D-Dimer: No results for input(s): "DDIMER" in the last 72 hours. Hemoglobin A1C: No results for input(s): "HGBA1C" in the last 72 hours. Fasting Lipid Panel: No results for input(s): "CHOL", "HDL", "LDLCALC", "TRIG", "CHOLHDL", "LDLDIRECT" in the last 72 hours. Thyroid Function Tests: No results for  input(s): "TSH", "T4TOTAL", "T3FREE", "THYROIDAB" in the last 72 hours.  Invalid input(s): "FREET3" Anemia Panel: No results for input(s): "VITAMINB12", "FOLATE", "FERRITIN", "TIBC", "IRON", "RETICCTPCT" in the last 72 hours.   Radiology: DG Chest 2 View  Result Date: 05/13/2022 CLINICAL DATA:  Chest pain, dizziness EXAM: CHEST - 2 VIEW COMPARISON:  Previous studies including chest radiographs done on 01/05/2021 FINDINGS: Transverse diameter heart is slightly increased. There is cardiac monitoring device in the anteromedial left lower thorax. There are linear densities in the lower lung fields, more so on the right side. There are scattered pleural calcifications. There is minimal blunting of lateral CP angles. There is no pneumothorax. There is previous left shoulder arthroplasty. IMPRESSION: Increased interstitial markings in the lower lung fields suggest scarring. There are no signs of alveolar pulmonary edema or new focal pulmonary consolidation. Blunting of posterior CP angles suggests small bilateral effusions. There are scattered pleural calcifications suggesting possible previous asbestos exposure. Electronically Signed   By: Elmer Picker M.D.   On: 05/13/2022 12:47    ECHO EF 60-65%, G1 DD  TELEMETRY reviewed by me (LT) 05/14/2022 : predominantly V pacing with periods of sinus rhythm with occasional PVCs, occasional short runs of NSVT, 4-5 beats at longest duration  EKG reviewed by me: A sensed V paced with occasional PVCs rate 84  Data reviewed by me (LT) 05/14/2022: ED note, admission H&P, last cardiology note, CBC, BMP, BNP, troponins, last echo report vitals, telemetry, pacemaker interrogation  Principal Problem:   Atypical chest pain Active Problems:   HTN (hypertension)   Type 2 diabetes mellitus (HCC)   Nonsustained monomorphic ventricular tachycardia (HCC)    ASSESSMENT AND PLAN:  Daron Offer is a 20yoM with a PMH of Mobitz type II AV block s/p Micra leadless  pacemaker implantation 01/09/2021, HFpEF (EF 60-65%, G1 DD, mildly elevated PASP 10/2020), history of CVA, hyperlipidemia, type 2 diabetes, hypertension who presented to [facility] ED 05/13/2022 with intermittent left arm/left upper chest pain and dizziness.  Cardiology is consulted for further assistance.  #Atypical chest pain Per patient,  occurring for "some time now" that he can localize to a single point just medial of his left shoulder, occasionally reproducible to palpation, sometimes occurring at rest.  Not relieved with position changes, sometimes bothersome with woodworking activities.  Not associated with nausea, diaphoresis.  Troponins minimally elevated, EKG without ischemic changes. -Agree with current therapy per primary team -Consider other etiologies of the patient's discomfort that are noncardiac in nature, possibly musculoskeletal -Continue home medicines without changes -Okay for discharge today from a cardiac standpoint, follow-up with his regular cardiologist, Dr. Saralyn Pilar in 1 to 2 weeks.  #Mobitz type II AV block s/p Medtronic Micra leadless pacemaker #PVCs, short runs of NSVT -Pacemaker interrogation revealed 94% ventricular pacing, appropriate threshold and sensing and a 7.4-year battery life On telemetry he is predominantly V pacing with periods of sinus rhythm with occasional PVCs, occasional short runs of NSVT, 4-5 beats at longest duration -Unlikely these periods of NSVT are clinically significant, consider starting a low-dose beta-blocker on an outpatient basis.  #HFpEF -BNP minimally elevated at 300, chronic peripheral edema at baseline per patient, clinically euvolemic.  #Elevated troponin Minimal elevation at 23-28 in the absence of chest pain or ischemic EKG changes, most consistent with demand/supply mismatch and not ACS   This patient's plan of care was discussed and created with Dr. Nehemiah Massed and he is in agreement.  Signed: Tristan Schroeder , PA-C 05/14/2022,  8:16 AM [facility] Cardiology

## 2022-05-24 ENCOUNTER — Emergency Department
Admission: EM | Admit: 2022-05-24 | Discharge: 2022-05-24 | Disposition: A | Discharge status: Home / Self Care | Payer: No Typology Code available for payment source | Attending: Emergency Medicine | Admitting: Emergency Medicine

## 2022-05-24 ENCOUNTER — Encounter: Payer: Self-pay | Admitting: Emergency Medicine

## 2022-05-24 ENCOUNTER — Other Ambulatory Visit: Payer: Self-pay

## 2022-05-24 ENCOUNTER — Emergency Department: Payer: No Typology Code available for payment source

## 2022-05-24 DIAGNOSIS — S68119A Complete traumatic metacarpophalangeal amputation of unspecified finger, initial encounter: Secondary | ICD-10-CM

## 2022-05-24 DIAGNOSIS — S68111A Complete traumatic metacarpophalangeal amputation of left index finger, initial encounter: Secondary | ICD-10-CM | POA: Insufficient documentation

## 2022-05-24 DIAGNOSIS — S61412A Laceration without foreign body of left hand, initial encounter: Secondary | ICD-10-CM

## 2022-05-24 DIAGNOSIS — W312XXA Contact with powered woodworking and forming machines, initial encounter: Secondary | ICD-10-CM | POA: Diagnosis not present

## 2022-05-24 DIAGNOSIS — S6992XA Unspecified injury of left wrist, hand and finger(s), initial encounter: Secondary | ICD-10-CM | POA: Diagnosis present

## 2022-05-24 MED ORDER — CEPHALEXIN 500 MG PO CAPS: 500.0000 mg | ORAL_CAPSULE | Freq: Four times a day (QID) | ORAL | 0 refills | Status: DC

## 2022-05-24 MED ORDER — TRAMADOL HCL 50 MG PO TABS
50.0000 mg | ORAL_TABLET | Freq: Once | ORAL | Status: AC
  Administered 2022-05-24: 50 mg via ORAL
  Filled 2022-05-24: qty 1

## 2022-05-24 MED ORDER — LIDOCAINE HCL (PF) 1 % IJ SOLN
15.0000 mL | Freq: Once | INTRAMUSCULAR | Status: AC
  Administered 2022-05-24: 15 mL
  Filled 2022-05-24: qty 15

## 2022-05-24 MED ORDER — CEPHALEXIN 500 MG PO CAPS
500.0000 mg | ORAL_CAPSULE | Freq: Once | ORAL | Status: AC
  Administered 2022-05-24: 500 mg via ORAL
  Filled 2022-05-24: qty 1

## 2022-05-24 MED ORDER — CEPHALEXIN 500 MG PO CAPS: 500.0000 mg | ORAL_CAPSULE | Freq: Four times a day (QID) | ORAL | 0 refills | Status: AC

## 2022-05-24 MED ORDER — TRAMADOL HCL 50 MG PO TABS: 50.0000 mg | ORAL_TABLET | Freq: Four times a day (QID) | ORAL | 0 refills | Status: DC | PRN

## 2022-05-24 MED ORDER — TRAMADOL HCL 50 MG PO TABS: 50.0000 mg | ORAL_TABLET | Freq: Four times a day (QID) | ORAL | 0 refills | Status: AC | PRN

## 2022-05-24 NOTE — Discharge Instructions (Signed)
Please seek medical attention for any high fevers, chest pain, shortness of breath, change in behavior, persistent vomiting, bloody stool or any other new or concerning symptoms.  

## 2022-05-24 NOTE — ED Provider Notes (Signed)
[facility] Provider Note    Event Date/Time   First MD Initiated Contact with Patient 05/24/22 1606     (approximate)   History   Hand Injury   HPI  Caleb Dougherty is a 88 y.o. male  who presents to the emergency department today because of concern for laceration and finger tip amputation to his left hand. It occurred while he was working with a table saw. The patient cut the tip of his index finger off and had lacerations through digits 3-5. His last tetanus shot was within the past year.    Physical Exam   Triage Vital Signs: ED Triage Vitals  Enc Vitals Group     BP 05/24/22 1554 (!) 154/99     Pulse Rate 05/24/22 1554 97     Resp 05/24/22 1554 18     Temp 05/24/22 1554 97.7 F (36.5 C)     Temp Source 05/24/22 1554 Oral     SpO2 05/24/22 1554 96 %     Weight 05/24/22 1554 170 lb (77.1 kg)     Height 05/24/22 1554 '5\' 8"'$  (1.727 m)     Head Circumference --      Peak Flow --      Pain Score 05/24/22 1553 10     Pain Loc --      Pain Edu? --      Excl. in [place]? --     Most recent vital signs: Vitals:   05/24/22 1554  BP: (!) 154/99  Pulse: 97  Resp: 18  Temp: 97.7 F (36.5 C)  SpO2: 96%    General: Awake, alert, oriented. CV:  Good peripheral perfusion.  Resp:  Normal effort.  Abd:  No distention.  Other:  Amputation of the left 2nd UE digit just distal to the DIP. Laceration to the palmer surface of digits 3-5 near the DIP.    ED Results / Procedures / Treatments   Labs (all labs ordered are listed, but only abnormal results are displayed) Labs Reviewed - No data to display   EKG  None   RADIOLOGY I independently interpreted and visualized the left hand. My interpretation: Amputation of distal 2nd phalanx of the 2nd digit of the left hand Radiology interpretation:  IMPRESSION:  1. Amputation distal aspect second digit, through the base of the  second distal phalanx.  2. Soft tissue lacerations of the third through  fifth digits. No  underlying fracture or radiopaque foreign body within these digits.  3. Multifocal joint space narrowing, chondrocalcinosis, and  periarticular calcifications. This may reflect underlying CPPD  arthropathy.    PROCEDURES:  Critical Care performed: No  Procedures   MEDICATIONS ORDERED IN ED: Medications - No data to display   IMPRESSION / MDM / [place] / ED COURSE  I reviewed the triage vital signs and the nursing notes.                              Differential diagnosis includes, but is not limited to, amputation, laceration.  Patient's presentation is most consistent with acute presentation with potential threat to life or bodily function.  Patient presented to the emergency department today because of concern of left hand injury after a table saw accident. The patient had lacerations closed. Discussed risk of infection with patient. Will give orthopedic follow up information.  FINAL CLINICAL IMPRESSION(S) / ED DIAGNOSES   Final diagnoses:  Traumatic amputation  of finger tip, initial encounter  Laceration of left hand, foreign body presence unspecified, initial encounter     Note:  This document was prepared using Dragon voice recognition software and may include unintentional dictation errors.    Nance Pear, MD 05/24/22 [phone]

## 2022-05-24 NOTE — ED Triage Notes (Signed)
Patient to ED via POV from home for a hand injury. Patient was using a saw and cut off top on pointer finger on left hand and cut into two middle fingers. Bleeding controlled at this time.

## 2022-05-26 IMAGING — CT CT CHEST W/O CM
2 of 4 series · 11 of 36 positions shown, 13 images · non-contrast
Comparison: 11/03/2020

CLINICAL DATA: Left lower chest discomfort and left upper quadrant
pain for 3 weeks

EXAM:
CT CHEST, ABDOMEN WITHOUT CONTRAST
TECHNIQUE: Multidetector CT imaging of the chest and abdomen was performed
following the standard protocol without IV contrast. Unenhanced CT
was performed per clinician order. Lack of IV contrast limits
sensitivity and specificity, especially for evaluation of
abdominalsolid viscera.

[Series 2: axials cap 5.00 · axial · 0.69mm/px · z∈[-1296,-946]mm · 8 of 91 slices shown, 10 images]
[im 11/91  mediastinal]
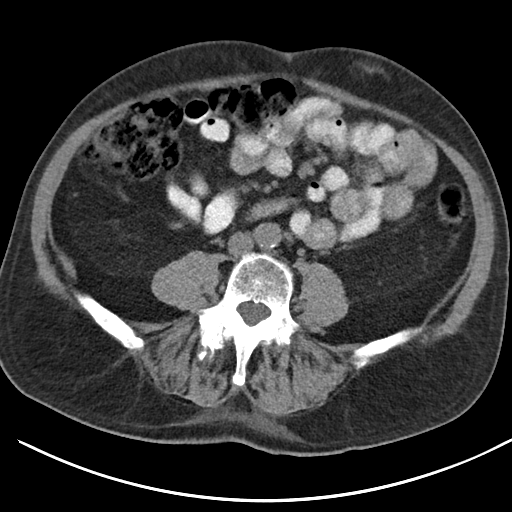
[im 11/91  lung]
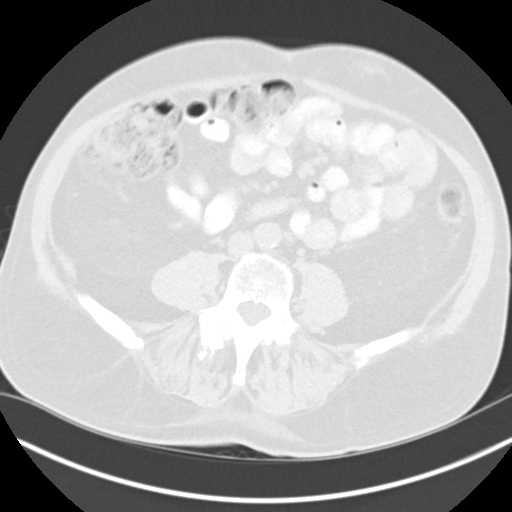
[im 21/91  lung]
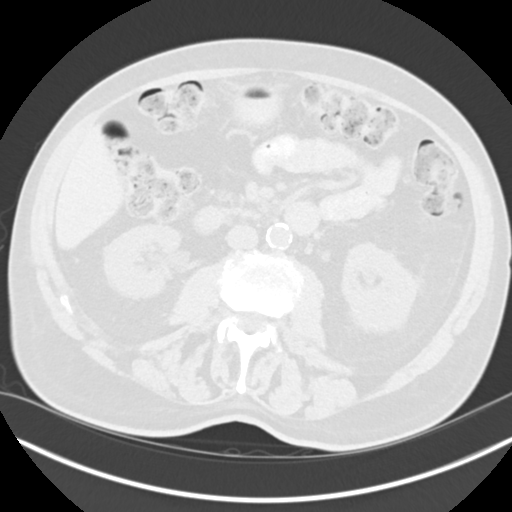
[im 31/91  lung]
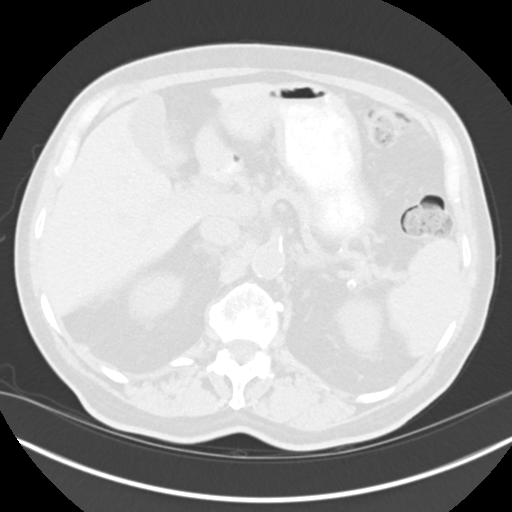
[im 41/91  lung]
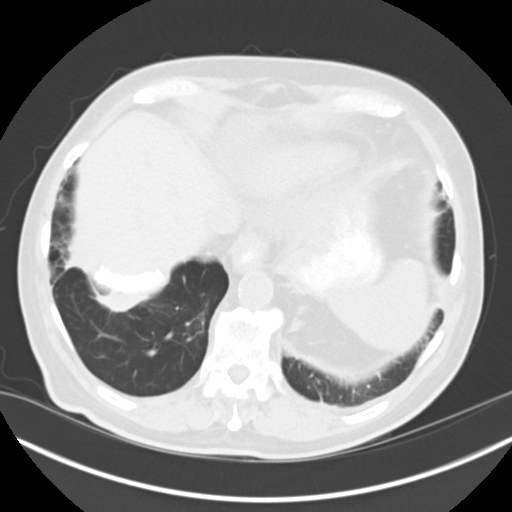
[im 51/91  mediastinal]
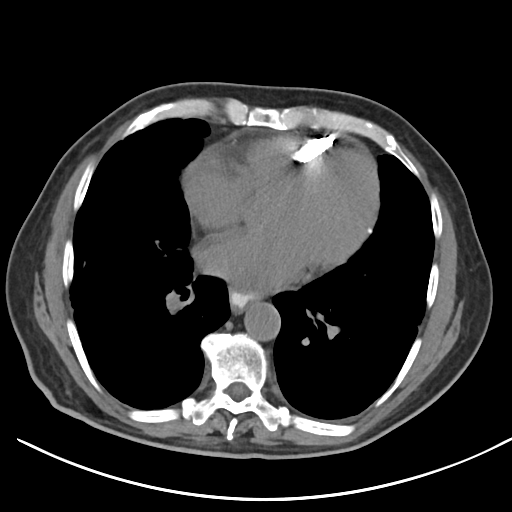
[im 51/91  lung]
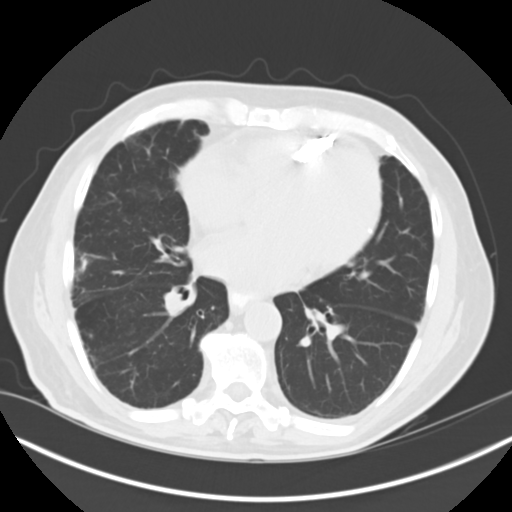
[im 61/91  lung]
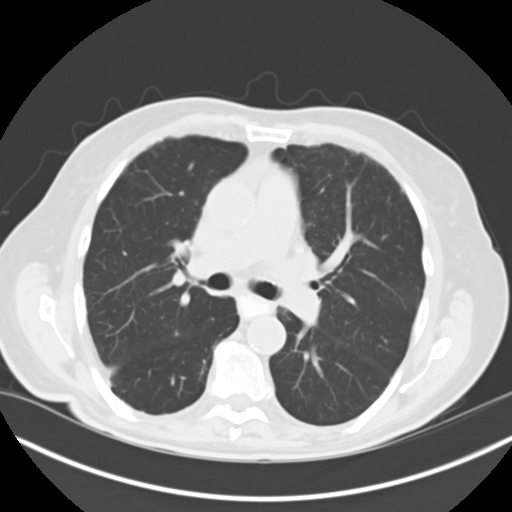
[im 71/91  lung]
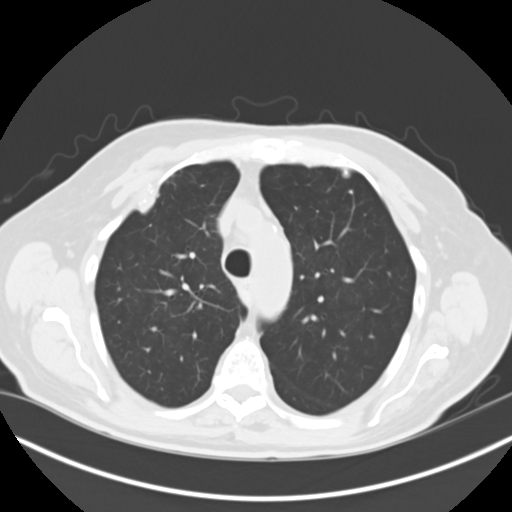
[im 81/91  lung]
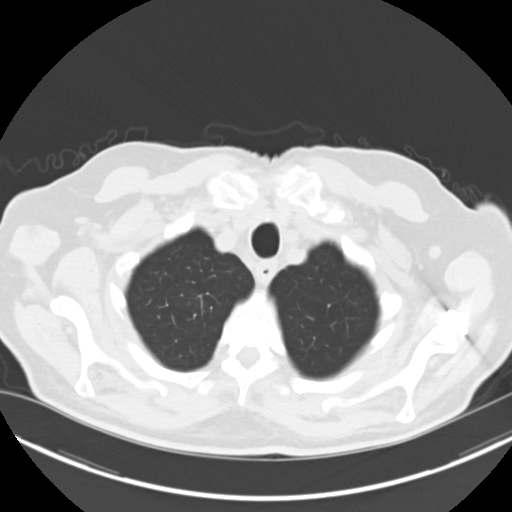

[Series 4: coronals cap 2.00 cor · coronal · 0.69mm/px · 3 of 146 slices shown]
[im 30/146  lung]
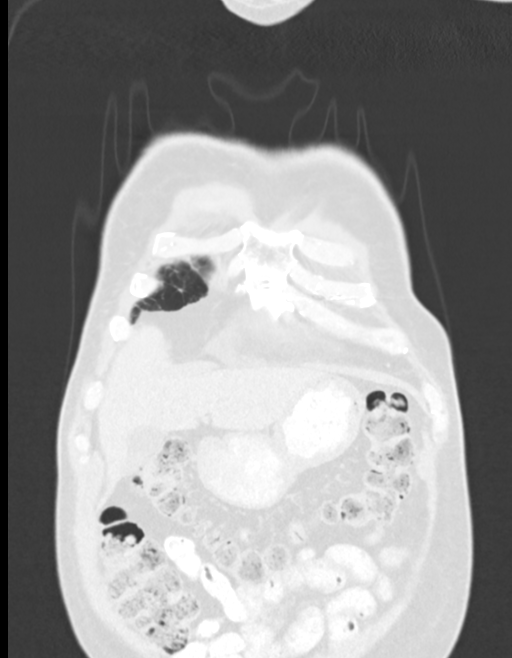
[im 59/146  lung]
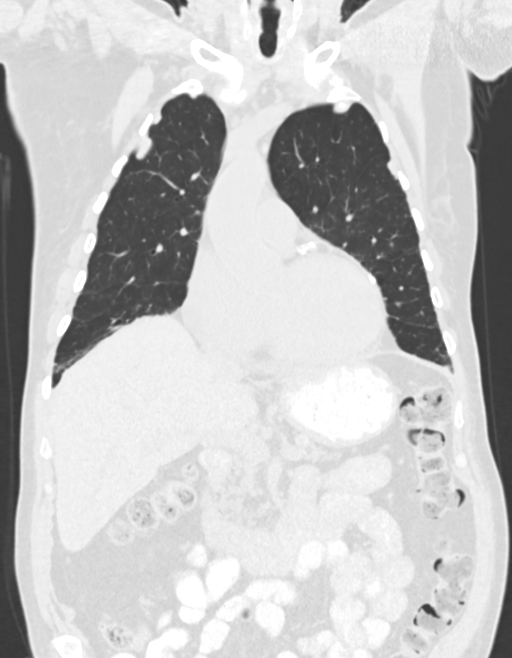
[im 88/146  lung]
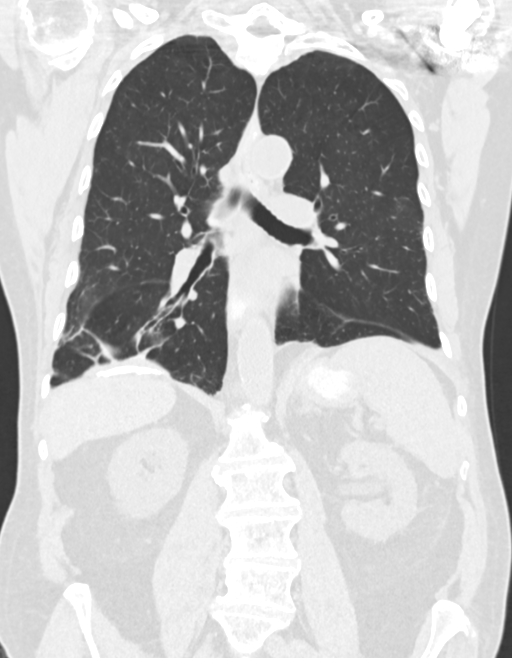

[11 of 36 positions shown; findings below may reference images not displayed]

FINDINGS: CT CHEST FINDINGS

Cardiovascular: Unenhanced imaging of the heart demonstrates trace
pericardial fluid. Pacemaker identified within the right ventricular
lumen.

Normal caliber of the thoracic aorta. Evaluation of the vascular
lumen is limited without IV contrast. Atherosclerosis of the aorta
and coronary vasculature again noted and unchanged.

Mediastinum/Nodes: There is mild wall thickening of the distal
esophagus proximal to the GE junction, which could reflect reflux
esophagitis. Trachea is unremarkable. Thyroid is stable. No
pathologic adenopathy.

Lungs/Pleura: Bilateral calcified pleural plaques are again noted.
No acute airspace disease, effusion, or pneumothorax. Minimal
bibasilar scarring and bronchiectasis. Central airways are patent.

Musculoskeletal: No acute or destructive bony lesions. Left shoulder
arthroplasty. Reconstructed images demonstrate no additional
findings.

CT ABDOMEN FINDINGS

Hepatobiliary: Unremarkable unenhanced appearance.

Pancreas: Unremarkable unenhanced appearance.

Spleen: Unremarkable unenhanced appearance.

Adrenals/Urinary Tract: No renal calculi or obstructive uropathy.
Visualized portions of the ureters are unremarkable. The adrenals
are normal.

Stomach/Bowel: Visualized portions of the bowel are unremarkable
without obstruction or ileus. Normal appendix right lower quadrant.
No bowel wall thickening or inflammatory change.

Vascular/Lymphatic: Mild atherosclerosis of the aorta. No abdominal
lymphadenopathy.

Other: No free intra-abdominal fluid or free gas. No abdominal wall
hernia.

Musculoskeletal: No acute or destructive bony lesions. Reconstructed
images demonstrate no additional findings.
IMPRESSION: 1. Mild wall thickening of the distal thoracic esophagus just
proximal to the GE junction, which could reflect esophagitis.
2. Otherwise no acute intrathoracic or intra-abdominal process.
3. Stable calcified pleural plaques consistent with previous
asbestos exposure.
4. Minimal bibasilar scarring and bronchiectasis.
5.  Aortic Atherosclerosis (HXJA8-W9S.S).

## 2022-05-26 IMAGING — CT CT ABDOMEN W/O CM
2 of 4 series · 13 of 46 positions shown, 15 images · non-contrast
Comparison: 11/03/2020

CLINICAL DATA: Left lower chest discomfort and left upper quadrant
pain for 3 weeks

EXAM:
CT CHEST, ABDOMEN WITHOUT CONTRAST
TECHNIQUE: Multidetector CT imaging of the chest and abdomen was performed
following the standard protocol without IV contrast. Unenhanced CT
was performed per clinician order. Lack of IV contrast limits
sensitivity and specificity, especially for evaluation of
abdominalsolid viscera.

[Series 2: axials cap 5.00 · axial · 0.69mm/px · z∈[-1306,-941]mm · 10 of 91 slices shown, 12 images]
[im 9/91  soft-tissue]
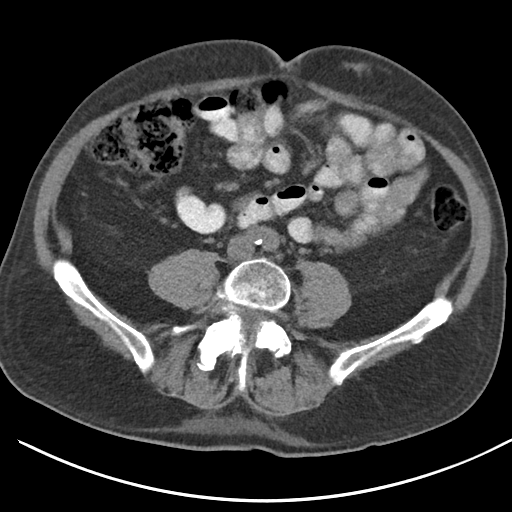
[im 9/91  bone]
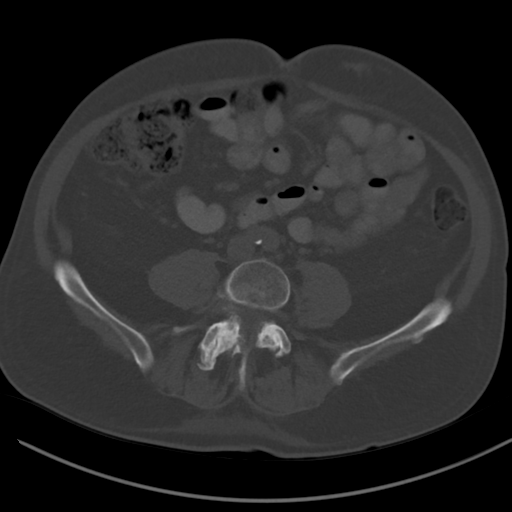
[im 17/91  soft-tissue]
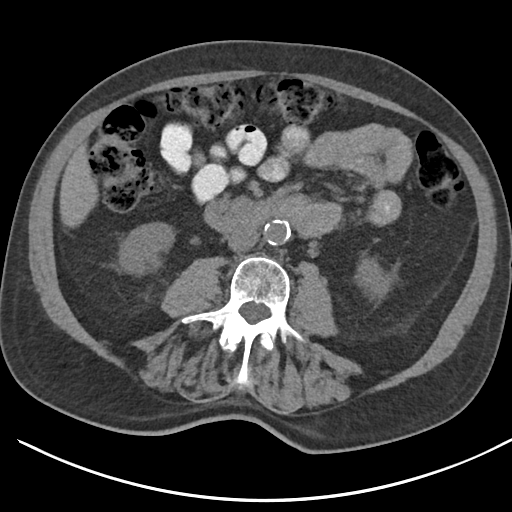
[im 25/91  soft-tissue]
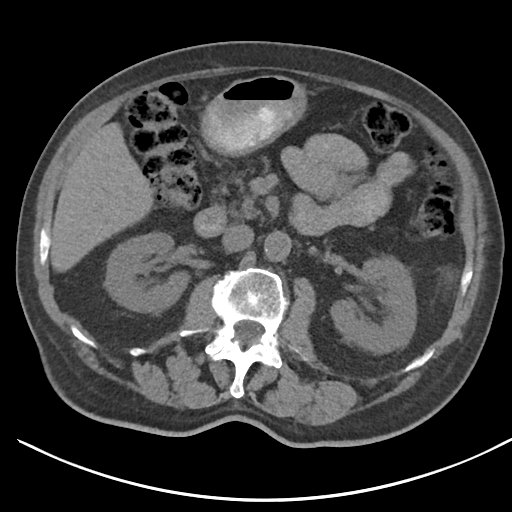
[im 33/91  soft-tissue]
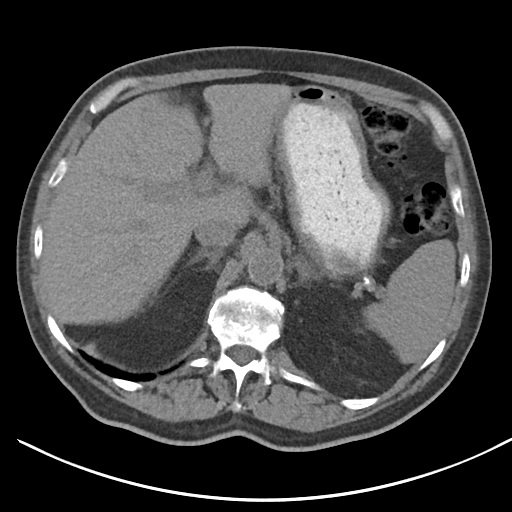
[im 41/91  soft-tissue]
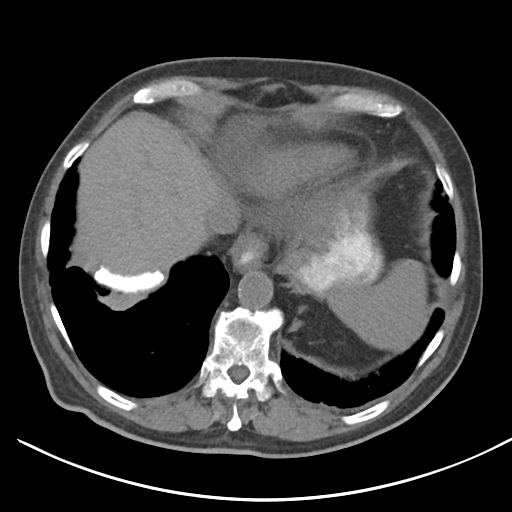
[im 50/91  soft-tissue]
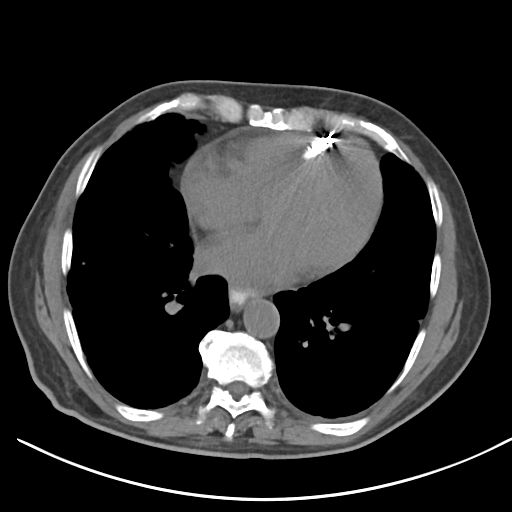
[im 58/91  soft-tissue]
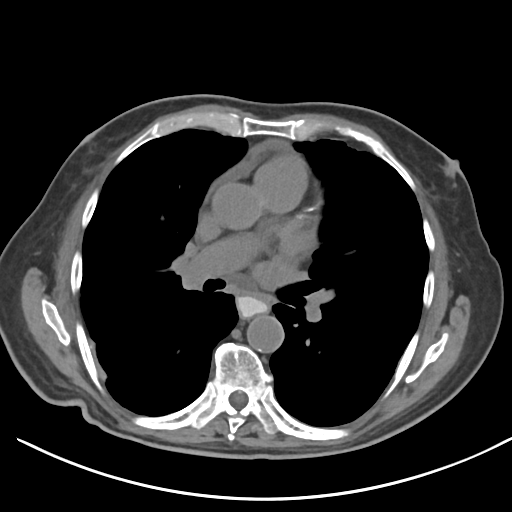
[im 66/91  soft-tissue]
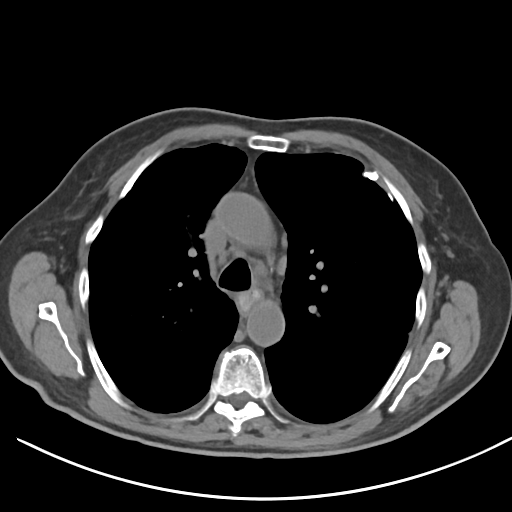
[im 74/91  soft-tissue]
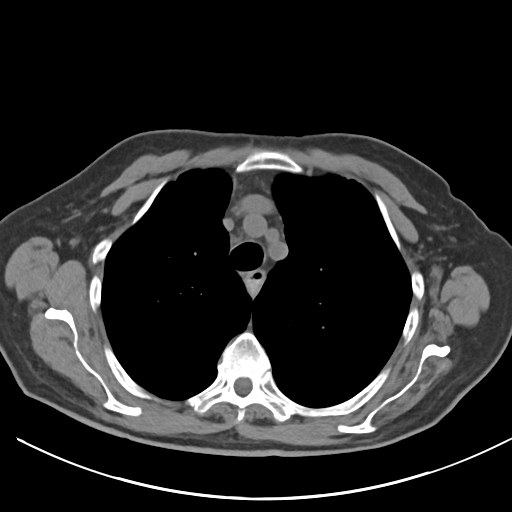
[im 74/91  bone]
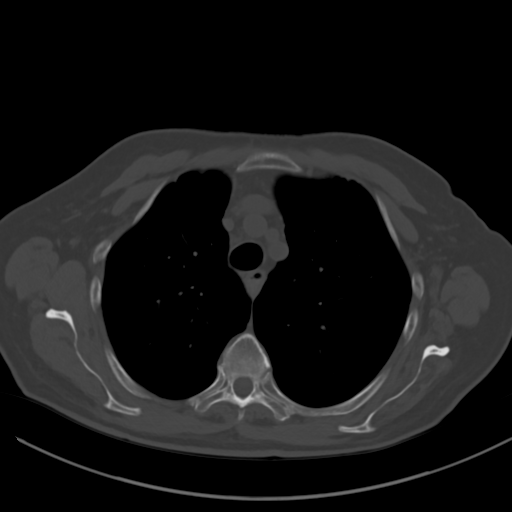
[im 82/91  soft-tissue]
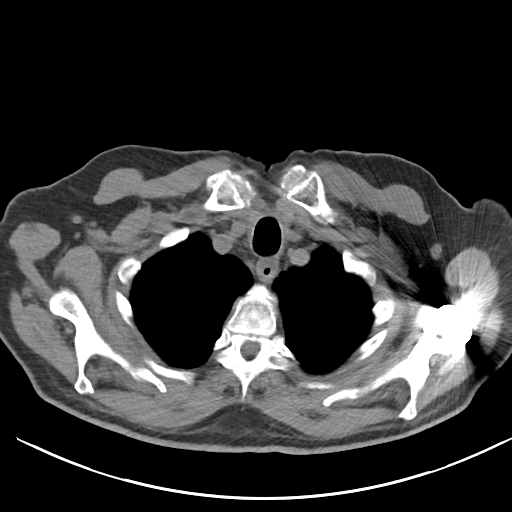

[Series 4: coronals cap 2.00 cor · coronal · 0.69mm/px · 3 of 146 slices shown]
[im 49/146  soft-tissue]
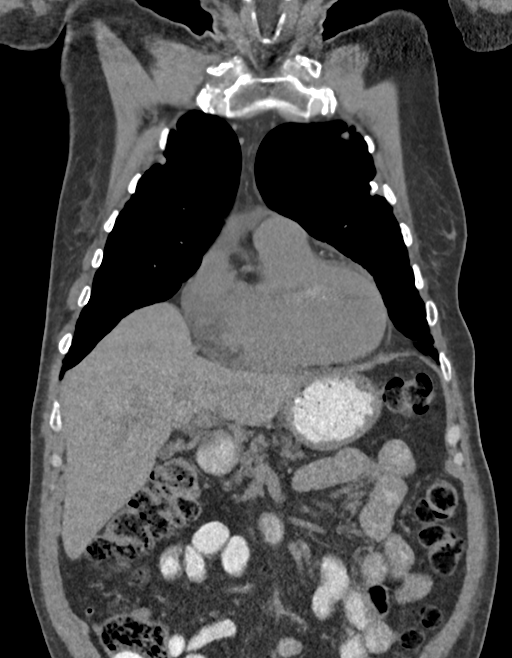
[im 65/146  soft-tissue]
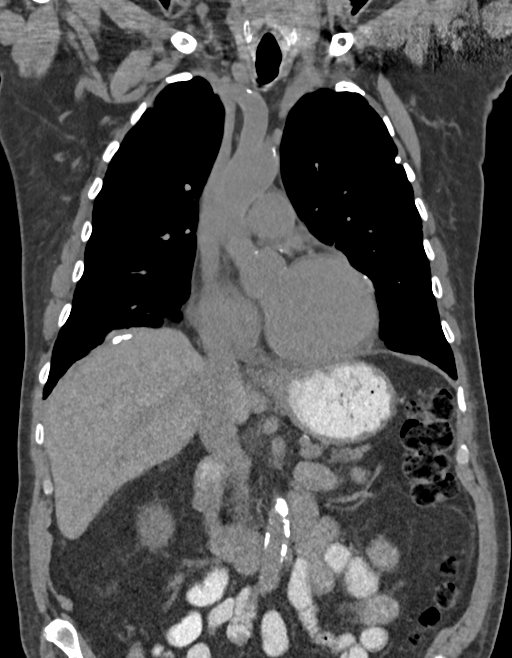
[im 81/146  soft-tissue]
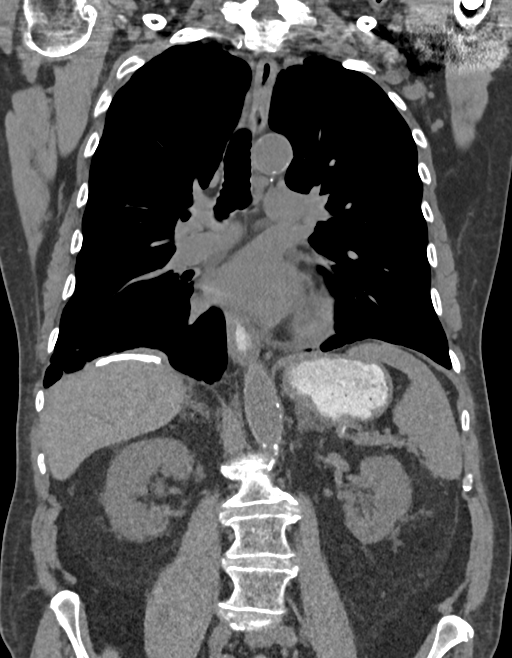

[13 of 46 positions shown; findings below may reference images not displayed]

FINDINGS: CT CHEST FINDINGS

Cardiovascular: Unenhanced imaging of the heart demonstrates trace
pericardial fluid. Pacemaker identified within the right ventricular
lumen.

Normal caliber of the thoracic aorta. Evaluation of the vascular
lumen is limited without IV contrast. Atherosclerosis of the aorta
and coronary vasculature again noted and unchanged.

Mediastinum/Nodes: There is mild wall thickening of the distal
esophagus proximal to the GE junction, which could reflect reflux
esophagitis. Trachea is unremarkable. Thyroid is stable. No
pathologic adenopathy.

Lungs/Pleura: Bilateral calcified pleural plaques are again noted.
No acute airspace disease, effusion, or pneumothorax. Minimal
bibasilar scarring and bronchiectasis. Central airways are patent.

Musculoskeletal: No acute or destructive bony lesions. Left shoulder
arthroplasty. Reconstructed images demonstrate no additional
findings.

CT ABDOMEN FINDINGS

Hepatobiliary: Unremarkable unenhanced appearance.

Pancreas: Unremarkable unenhanced appearance.

Spleen: Unremarkable unenhanced appearance.

Adrenals/Urinary Tract: No renal calculi or obstructive uropathy.
Visualized portions of the ureters are unremarkable. The adrenals
are normal.

Stomach/Bowel: Visualized portions of the bowel are unremarkable
without obstruction or ileus. Normal appendix right lower quadrant.
No bowel wall thickening or inflammatory change.

Vascular/Lymphatic: Mild atherosclerosis of the aorta. No abdominal
lymphadenopathy.

Other: No free intra-abdominal fluid or free gas. No abdominal wall
hernia.

Musculoskeletal: No acute or destructive bony lesions. Reconstructed
images demonstrate no additional findings.
IMPRESSION: 1. Mild wall thickening of the distal thoracic esophagus just
proximal to the GE junction, which could reflect esophagitis.
2. Otherwise no acute intrathoracic or intra-abdominal process.
3. Stable calcified pleural plaques consistent with previous
asbestos exposure.
4. Minimal bibasilar scarring and bronchiectasis.
5.  Aortic Atherosclerosis (HXJA8-W9S.S).

## 2022-11-14 ENCOUNTER — Other Ambulatory Visit
Admission: RE | Admit: 2022-11-14 | Discharge: 2022-11-14 | Disposition: A | Discharge status: Home / Self Care | Payer: Medicare Other | Source: Ambulatory Visit | Attending: Physician Assistant | Admitting: Physician Assistant

## 2022-11-14 DIAGNOSIS — R600 Localized edema: Secondary | ICD-10-CM | POA: Diagnosis present

## 2022-11-14 DIAGNOSIS — R635 Abnormal weight gain: Secondary | ICD-10-CM | POA: Insufficient documentation

## 2022-11-14 DIAGNOSIS — R0602 Shortness of breath: Secondary | ICD-10-CM | POA: Insufficient documentation

## 2022-11-14 LAB — BRAIN NATRIURETIC PEPTIDE: B Natriuretic Peptide: 556.4 pg/mL — ABNORMAL HIGH (ref 0.0–100.0)

## 2023-02-02 ENCOUNTER — Ambulatory Visit
Admission: EM | Admit: 2023-02-02 | Discharge: 2023-02-02 | Disposition: A | Discharge status: Home / Self Care | Payer: Medicare Other

## 2023-02-02 ENCOUNTER — Encounter: Payer: Self-pay | Admitting: Emergency Medicine

## 2023-02-02 DIAGNOSIS — L02511 Cutaneous abscess of right hand: Secondary | ICD-10-CM

## 2023-02-02 DIAGNOSIS — S91111A Laceration without foreign body of right great toe without damage to nail, initial encounter: Secondary | ICD-10-CM | POA: Diagnosis not present

## 2023-02-02 MED ORDER — SULFAMETHOXAZOLE-TRIMETHOPRIM 800-160 MG PO TABS: 1.0000 | ORAL_TABLET | Freq: Two times a day (BID) | ORAL | 0 refills | Status: DC

## 2023-02-02 NOTE — ED Provider Notes (Signed)
[facility]    CSN: [id number] Arrival date & time: 02/02/23  1230      History   Chief Complaint No chief complaint on file.   HPI Caleb Dougherty is a 89 y.o. male.   HPI  Presents to UC with concern for possible infection to her right middle finger, right great toe.  Patient states he "lanced" the finger producing some purulent drainage.  He believes he may have cut his toe on a piece of broken glass or possibly glass still in the toe.  PMH includes DM 2.  Daily low-dose aspirin.  Past Medical History:  Diagnosis Date   Arthritis    osteoarthritis-knees and feet   BPH (benign prostatic hypertrophy)    Cancer (HCC)    melanoma(back)-no futher issues. 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   Bradycardia with less than 60 beats per minute 01/05/2021   Heart block AV second degree 01/05/2021   SOB (shortness of breath) 01/05/2021   AKI (acute kidney injury) (HCC) 01/05/2021   Vitamin B 12 deficiency 11/18/2020   Type 2 diabetes mellitus (HCC) 11/18/2020   Encephalopathy 11/08/2020   Hypertensive emergency 11/08/2020   Acute ischemic stroke (HCC)    Acute encephalopathy    Fever    HTN (hypertension)    Malnutrition of moderate degree 11/02/2020   Altered mental status 11/01/2020   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   S/P right TKA 05/25/2014   DJD (degenerative joint  disease) of knee 05/25/2014   Overweight (BMI 25.0-29.9) 02/24/2014   S/P left TKA 02/23/2014    Past Surgical History:  Procedure Laterality Date   CARPAL TUNNEL RELEASE Right 06/23/2013   Procedure: RIGHT CARPAL TUNNEL RELEASE;  Surgeon: Wyn Forster., MD;  Location:  [facility];  Service: Orthopedics;  Laterality: Right;   CATARACT EXTRACTION Right    CATARACT EXTRACTION W/PHACO Left 03/30/2019   Procedure: CATARACT EXTRACTION PHACO AND INTRAOCULAR LENS PLACEMENT (IOC) LEFT DIABETES  02:21.8  19.2%  28.18;  Surgeon: Nevada Crane, MD;  Location: [facility];  Service: Ophthalmology;  Laterality: Left;  diabetic - insulin   COLONOSCOPY     INCISION AND DRAINAGE WOUND WITH FOREIGN BODY REMOVAL Left 12/07/2014   Procedure: INCISION AND DRAINAGE WOUND WITH FOREIGN BODY REMOVAL;  Surgeon: Christena Flake, MD;  Location: [facility];  Service: Orthopedics;  Laterality: Left;   PACEMAKER LEADLESS INSERTION N/A 01/09/2021   Procedure: PACEMAKER LEADLESS INSERTION;  Surgeon: Marcina Millard, MD;  Location: [facility];  Service: Cardiovascular;  Laterality: N/A;   REVERSE SHOULDER ARTHROPLASTY Left 11/19/2014   Procedure: LEFT REVERSE SHOULDER ARTHROPLASTY;  Surgeon: Beverely Low, MD;  Location: [facility];  Service: Orthopedics;  Laterality: Left;   TOTAL KNEE ARTHROPLASTY Left 02/23/2014   Procedure: LEFT TOTAL  KNEE REPLACEMENT;  Surgeon: Shelda Pal, MD;  Location: [facility];  Service: Orthopedics;  Laterality: Left;   TOTAL KNEE ARTHROPLASTY Right 05/25/2014   Procedure: RIGHT TOTAL KNEE ARTHROPLASTY;  Surgeon: Shelda Pal, MD;  Location: [facility];  Service: Orthopedics;  Laterality: Right;       Home Medications    Prior to Admission medications   Medication Sig Start Date End Date Taking? Authorizing Provider  amLODipine (NORVASC) 10 MG tablet Take 10 mg by mouth daily in the afternoon.   Yes [provider]  aspirin EC 81 MG tablet Take 81 mg  by mouth daily.   Yes [provider]  cyanocobalamin (VITAMIN B12) 1000 MCG tablet Take 1,000 mcg by mouth once a week.   Yes [provider]  furosemide (LASIX) 20 MG tablet Take 20 mg by mouth daily.   Yes [provider]  hydrALAZINE (APRESOLINE) 50 MG tablet Take 50 mg by mouth 3 (three) times daily. 12/13/20  Yes [provider]  insulin glargine-yfgn (SEMGLEE) 100 UNIT/ML Pen Inject 30 Units into the skin daily. 05/14/22  Yes Wouk, Wilfred Curtis, MD  losartan (COZAAR) 100 MG tablet Take 100 mg by mouth daily. 09/12/21  Yes [provider]  Semaglutide,0.25 or 0.5MG /DOS, 2 MG/3ML SOPN Inject into the skin. 08/29/22  Yes [provider]  tamsulosin (FLOMAX) 0.4 MG CAPS capsule Take 2 capsules (0.8 mg total) by mouth at bedtime. 11/18/20  Yes Love, Evlyn Kanner, PA-C  Multiple Vitamins-Minerals (MULTIVITAMIN WITH MINERALS) tablet Take 1 tablet by mouth daily. Patient not taking: Reported on 05/13/2022    [provider]  traMADol (ULTRAM) 50 MG tablet Take 1 tablet (50 mg total) by mouth every 6 (six) hours as needed. 05/24/22 05/24/23  Phineas Semen, MD    Family History Family History  Problem Relation Age of Onset   Cancer Father    Cancer Sister    Other Sister        question of ALS    Social History Social History   Tobacco Use   Smoking status: Never   Smokeless tobacco: Never  Vaping Use   Vaping Use: Never used  Substance Use Topics   Alcohol use: No   Drug use: No     Allergies   Gabapentin, Indomethacin, Lisinopril, Nortriptyline, and Empagliflozin   Review of Systems Review of Systems   Physical Exam Triage Vital Signs ED Triage Vitals  Enc Vitals Group     BP 02/02/23 1239 116/67     Pulse Rate 02/02/23 1239 63     Resp 02/02/23 1239 16     Temp 02/02/23 1239 97.6 F (36.4 C)     Temp Source 02/02/23 1239 Oral     SpO2 02/02/23 1239 93 %     Weight --      Height --      Head  Circumference --      Peak Flow --      Pain Score 02/02/23 1244 2     Pain Loc --      Pain Edu? --      Excl. in GC? --    No data found.  Updated Vital Signs BP 116/67 (BP Location: Left Arm)   Pulse 63   Temp 97.6 F (36.4 C) (Oral)   Resp 16   SpO2 93%   Visual Acuity Right Eye Distance:   Left Eye Distance:   Bilateral Distance:    Right Eye Near:   Left  Eye Near:    Bilateral Near:     Physical Exam Vitals reviewed.  Constitutional:      Appearance: Normal appearance.  Musculoskeletal:       Hands:       Feet:  Skin:    General: Skin is warm and dry.  Neurological:     General: No focal deficit present.     Mental Status: He is alert and oriented to person, place, and time.  Psychiatric:        Mood and Affect: Mood normal.        Behavior: Behavior normal.      UC Treatments / Results  Labs (all labs ordered are listed, but only abnormal results are displayed) Labs Reviewed - No data to display  EKG   Radiology No results found.  Procedures Incision and Drainage  Date/Time: 02/02/2023 1:36 PM  Performed by: Charma Igo, FNP Authorized by: Charma Igo, FNP   Consent:    Consent obtained:  Verbal   Consent given by:  Patient and healthcare agent   Risks discussed:  Bleeding   Alternatives discussed:  No treatment Universal protocol:    Procedure explained and questions answered to patient or proxy's satisfaction: yes     Relevant documents present and verified: yes     Test results available : no     Imaging studies available: no     Required blood products, implants, devices, and special equipment available: no     Site/side marked: no     Immediately prior to procedure, a time out was called: yes     Patient identity confirmed:  Verbally with patient Location:    Type:  Abscess   Size:  1 cm   Location:  Upper extremity   Upper extremity location:  Finger   Finger location:  R long finger Pre-procedure details:     Skin preparation:  Povidone-iodine Anesthesia:    Anesthesia method:  Nerve block   Block anesthetic:  Lidocaine 1% w/o epi   Block injection procedure:  Anatomic landmarks identified   Block outcome:  Anesthesia achieved Procedure details:    Ultrasound guidance: no     Needle aspiration: no     Incision types:  Stab incision   Incision depth:  Dermal   Wound management:  Probed and deloculated   Drainage:  Bloody and serous   Drainage amount:  Scant   Wound treatment:  Wound left open   Packing materials:  None Post-procedure details:    Procedure completion:  Tolerated Comments:     Recommended daily soak in warm water with dissolved Epsom salt followed by cleaning with Betadine solution.  Watch for signs of infection.  (including critical care time)  Medications Ordered in UC Medications - No data to display  Initial Impression / Assessment and Plan / UC Course  I have reviewed the triage vital signs and the nursing notes.  Pertinent labs & imaging results that were available during my care of the patient were reviewed by me and considered in my medical decision making (see chart for details).   Erythema and edema in a 2 cm area near the DIP of the right middle finger.  There is a 1 cm area at the center that is white in color and raised, possibly containing purulent discharge.  Exquisitely tender to palpation.  See procedure documentation.  There is an additional laceration of his right great toe with no evidence of infection or foreign body.  I am  inclined to allow this to heal while watching for signs of foreign body and infection.  Antibiotics will be prescribed given his increased risk.  Will prescribe Bactrim.  GFR recently measured at 38 and no dose reduction is indicated.  Counseled patient on potential for adverse effects with medications prescribed/recommended today, ER and return-to-clinic precautions discussed, patient verbalized understanding and agreement  with care plan.  Final Clinical Impressions(s) / UC Diagnoses   Final diagnoses:  None   Discharge Instructions   None    ED Prescriptions   None    PDMP not reviewed this encounter.   Charma Igo, [place] 02/02/23 1343

## 2023-02-02 NOTE — Discharge Instructions (Addendum)
Recommend soaking both your toe and finger injuries in warm water with dissolved Epsom salts once or twice daily.  Clean with Betadine and allowed to dry.  Cover the wound with a sterile dressing.  Watch for signs of infection.  Take the prescribed antibiotic twice daily.  Follow up here or with your primary care provider if your symptoms are worsening or not improving.

## 2023-02-02 NOTE — ED Triage Notes (Addendum)
Reports noticing an infected abrasion to right middle finger a couple days ago. Patient states he lanced the area and got some purulent drainage out. Also noticed a small cut to the right foot big toe, possibly one day last week, unsure of exact day. Patient is a diabetic. States he broke some glass a week ago and had believed he had gotten it all up. Didn't notice that he cut his toe or possibly gotten glass stuck in toe until it started bleeding.

## 2023-02-05 ENCOUNTER — Ambulatory Visit (INDEPENDENT_AMBULATORY_CARE_PROVIDER_SITE_OTHER): Payer: Medicare Other

## 2023-02-05 ENCOUNTER — Ambulatory Visit
Admission: EM | Admit: 2023-02-05 | Discharge: 2023-02-05 | Disposition: A | Discharge status: Home / Self Care | Payer: Medicare Other | Attending: Emergency Medicine | Admitting: Emergency Medicine

## 2023-02-05 DIAGNOSIS — L03011 Cellulitis of right finger: Secondary | ICD-10-CM | POA: Diagnosis not present

## 2023-02-05 DIAGNOSIS — M79644 Pain in right finger(s): Secondary | ICD-10-CM | POA: Diagnosis not present

## 2023-02-05 DIAGNOSIS — L02511 Cutaneous abscess of right hand: Secondary | ICD-10-CM

## 2023-02-05 MED ORDER — DOXYCYCLINE HYCLATE 100 MG PO CAPS: 100.0000 mg | ORAL_CAPSULE | Freq: Two times a day (BID) | ORAL | 0 refills | Status: AC

## 2023-02-05 NOTE — ED Triage Notes (Addendum)
Patient to Urgent Care with complaints of  right middle finger pain. Reports he was seen here on Saturday and finger was lanced. Patient reports he feels as though he might have something in it (unsure what) and requests an x-ray.  Reports pain was worse yesterday but after he removed the bandage and soaked it in epsom salt the pain improved. No further drainage.

## 2023-02-05 NOTE — ED Provider Notes (Signed)
Caleb Dougherty    CSN: [id number] Arrival date & time: 02/05/23  0931      History   Chief Complaint Chief Complaint  Patient presents with   Hand Pain    HPI Caleb Dougherty is a 89 y.o. male.  Accompanied by his son, patient presents with pain, swelling, purulent drainage, redness from a wound on his right middle finger.  He is concerned for possible retained foreign body.  He is on an antibiotic and has been soaking it in Epsom salt.  No fever or other symptoms.  Patient was seen at this urgent care on 02/02/2023; diagnosed with abscess of right middle finger and laceration of right great toe; I&D performed and treated with Bactrim.  Last tetanus 2022.  The history is provided by the patient, a relative and medical records.    Past Medical History:  Diagnosis Date   Arthritis    osteoarthritis-knees and feet   BPH (benign prostatic hypertrophy)    Cancer (HCC)    melanoma(back)-no futher issues. 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   Bradycardia with less than 60 beats per minute 01/05/2021   Heart block AV second degree 01/05/2021   SOB (shortness of breath) 01/05/2021   AKI (acute kidney injury) (HCC) 01/05/2021   Vitamin B 12 deficiency 11/18/2020   Type 2 diabetes mellitus (HCC) 11/18/2020   Encephalopathy 11/08/2020   Hypertensive emergency 11/08/2020   Acute ischemic  stroke (HCC)    Acute encephalopathy    Fever    HTN (hypertension)    Malnutrition of moderate degree 11/02/2020   Altered mental status 11/01/2020   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   S/P right TKA 05/25/2014   DJD (degenerative joint disease) of knee 05/25/2014   Overweight (BMI 25.0-29.9) 02/24/2014   S/P left TKA 02/23/2014    Past Surgical History:  Procedure Laterality Date   CARPAL TUNNEL RELEASE Right 06/23/2013   Procedure: RIGHT CARPAL TUNNEL RELEASE;  Surgeon: Wyn Forster., MD;  Location: [facility];  Service: Orthopedics;  Laterality: Right;   CATARACT EXTRACTION Right    CATARACT EXTRACTION W/PHACO Left 03/30/2019   Procedure: CATARACT EXTRACTION PHACO AND INTRAOCULAR LENS PLACEMENT (IOC) LEFT DIABETES  02:21.8  19.2%  28.18;  Surgeon: Nevada Crane, MD;  Location: [facility];  Service: Ophthalmology;  Laterality: Left;  diabetic - insulin   COLONOSCOPY     INCISION AND DRAINAGE WOUND WITH FOREIGN BODY REMOVAL Left 12/07/2014   Procedure: INCISION AND DRAINAGE WOUND WITH FOREIGN BODY REMOVAL;  Surgeon: Christena Flake, MD;  Location: [facility];  Service: Orthopedics;  Laterality: Left;   PACEMAKER LEADLESS INSERTION N/A 01/09/2021   Procedure: PACEMAKER LEADLESS INSERTION;  Surgeon: Marcina Millard, MD;  Location: [facility];  Service: Cardiovascular;  Laterality:  N/A;   REVERSE SHOULDER ARTHROPLASTY Left 11/19/2014   Procedure: LEFT REVERSE SHOULDER ARTHROPLASTY;  Surgeon: Beverely Low, MD;  Location: [facility];  Service: Orthopedics;  Laterality: Left;   TOTAL KNEE ARTHROPLASTY Left 02/23/2014   Procedure: LEFT TOTAL KNEE REPLACEMENT;  Surgeon: Shelda Pal, MD;  Location: [facility];  Service: Orthopedics;  Laterality: Left;   TOTAL KNEE ARTHROPLASTY Right 05/25/2014   Procedure: RIGHT TOTAL KNEE ARTHROPLASTY;  Surgeon: Shelda Pal, MD;  Location: [facility];  Service: Orthopedics;  Laterality: Right;       Home Medications     Prior to Admission medications   Medication Sig Start Date End Date Taking? Authorizing Provider  doxycycline (VIBRAMYCIN) 100 MG capsule Take 1 capsule (100 mg total) by mouth 2 (two) times daily. 02/05/23  Yes Mickie Bail, NP  amLODipine (NORVASC) 10 MG tablet Take 10 mg by mouth daily in the afternoon.    [provider]  aspirin EC 81 MG tablet Take 81 mg by mouth daily.    [provider]  cyanocobalamin (VITAMIN B12) 1000 MCG tablet Take 1,000 mcg by mouth once a week.    [provider]  furosemide (LASIX) 20 MG tablet Take 20 mg by mouth daily.    [provider]  hydrALAZINE (APRESOLINE) 50 MG tablet Take 50 mg by mouth 3 (three) times daily. 12/13/20   [provider]  insulin glargine-yfgn (SEMGLEE) 100 UNIT/ML Pen Inject 30 Units into the skin daily. 05/14/22   Wouk, Wilfred Curtis, MD  losartan (COZAAR) 100 MG tablet Take 100 mg by mouth daily. 09/12/21   [provider]  Multiple Vitamins-Minerals (MULTIVITAMIN WITH MINERALS) tablet Take 1 tablet by mouth daily. Patient not taking: Reported on 05/13/2022    [provider]  Semaglutide,0.25 or 0.5MG /DOS, 2 MG/3ML SOPN Inject into the skin. 08/29/22   [provider]  tamsulosin (FLOMAX) 0.4 MG CAPS capsule Take 2 capsules (0.8 mg total) by mouth at bedtime. 11/18/20   Love, Evlyn Kanner, PA-C  traMADol (ULTRAM) 50 MG tablet Take 1 tablet (50 mg total) by mouth every 6 (six) hours as needed. 05/24/22 05/24/23  Phineas Semen, MD    Family History Family History  Problem Relation Age of Onset   Cancer Father    Cancer Sister    Other Sister        question of ALS    Social History Social History   Tobacco Use   Smoking status: Never   Smokeless tobacco: Never  Vaping Use   Vaping Use: Never used  Substance Use Topics   Alcohol use: No   Drug use: No     Allergies   Gabapentin, Indomethacin, Lisinopril, Nortriptyline, and Empagliflozin   Review  of Systems Review of Systems  Constitutional:  Negative for chills and fever.  Musculoskeletal:  Positive for arthralgias and joint swelling.  Skin:  Positive for color change and wound.  All other systems reviewed and are negative.    Physical Exam Triage Vital Signs ED Triage Vitals  Enc Vitals Group     BP 02/05/23 1011 100/65     Pulse Rate 02/05/23 1006 75     Resp 02/05/23 1006 18     Temp 02/05/23 1006 97.7 F (36.5 C)     Temp src --      SpO2 02/05/23 1006 96 %     Weight --      Height --      Head Circumference --  Peak Flow --      Pain Score 02/05/23 1006 3     Pain Loc --      Pain Edu? --      Excl. in GC? --    No data found.  Updated Vital Signs BP 100/65   Pulse 75   Temp 97.7 F (36.5 C)   Resp 18   SpO2 96%   Visual Acuity Right Eye Distance:   Left Eye Distance:   Bilateral Distance:    Right Eye Near:   Left Eye Near:    Bilateral Near:     Physical Exam Constitutional:      General: He is not in acute distress. HENT:     Mouth/Throat:     Mouth: Mucous membranes are moist.  Cardiovascular:     Rate and Rhythm: Normal rate and regular rhythm.  Pulmonary:     Effort: Pulmonary effort is normal. No respiratory distress.  Musculoskeletal:        General: Swelling and tenderness present. Normal range of motion.  Skin:    General: Skin is warm and dry.     Findings: Erythema and lesion present.     Comments: Right middle finger wound with purulent drainage and erythema.  See pictures.   Neurological:     General: No focal deficit present.     Mental Status: He is alert.     Sensory: No sensory deficit.     Motor: No weakness.         UC Treatments / Results  Labs (all labs ordered are listed, but only abnormal results are displayed) Labs Reviewed - No data to display  EKG   Radiology DG Finger Middle Right  Result Date: 02/05/2023 CLINICAL DATA:  Right middle finger pain. EXAM: RIGHT MIDDLE FINGER 2+V  COMPARISON:  None Available. FINDINGS: There is no evidence of fracture or dislocation. There is no evidence of arthropathy or other focal bone abnormality. Diffuse soft tissue swelling is noted concerning for cellulitis. IMPRESSION: No fracture or bony abnormality. Diffuse soft tissue swelling concerning for cellulitis. Electronically Signed   By: Lupita Raider M.D.   On: 02/05/2023 11:17    Procedures Procedures (including critical care time)  Medications Ordered in UC Medications - No data to display  Initial Impression / Assessment and Plan / UC Course  I have reviewed the triage vital signs and the nursing notes.  Pertinent labs & imaging results that were available during my care of the patient were reviewed by me and considered in my medical decision making (see chart for details).    Right finger pain with abscess and cellulitis.  Wound has purulent drainage and surrounding erythema.  X-ray negative for bony abnormality.  Discontinuing Bactrim and starting doxycycline.  Tetanus is up-to-date.  Wound care instructions and signs of worsening infection discussed.  Instructed patient to have the wound rechecked in person tomorrow either by his PCP or return here if he is not able to be seen by his PCP tomorrow.  Education provided on cellulitis.  Patient and his son agree to plan of care.  Final Clinical Impressions(s) / UC Diagnoses   Final diagnoses:  Finger pain, right  Abscess of right middle finger  Cellulitis of finger of right hand     Discharge Instructions      Stop the current antibiotic and start the doxycycline as directed.  Follow-up with your primary care provider for a recheck of the wound tomorrow.  If you are  unable to see your primary care provider, return here tomorrow for wound recheck.     ED Prescriptions     Medication Sig Dispense Auth. Provider   doxycycline (VIBRAMYCIN) 100 MG capsule Take 1 capsule (100 mg total) by mouth 2 (two) times daily. 20  capsule Mickie Bail, NP      PDMP not reviewed this encounter.   Mickie Bail, NP 02/05/23 1121

## 2023-02-05 NOTE — Discharge Instructions (Signed)
Stop the current antibiotic and start the doxycycline as directed.  Follow-up with your primary care provider for a recheck of the wound tomorrow.  If you are unable to see your primary care provider, return here tomorrow for wound recheck.
